# Patient Record
Sex: Male | Born: 1935 | Race: White | Hispanic: No | Smoking: Former smoker
Health system: Southern US, Community
[De-identification: ages and names within clinical notes are randomized; demographics above are authoritative.]

## PROBLEM LIST (undated history)

## (undated) DIAGNOSIS — R1310 Dysphagia, unspecified: Principal | ICD-10-CM

## (undated) DIAGNOSIS — F4320 Adjustment disorder, unspecified: Secondary | ICD-10-CM

## (undated) DIAGNOSIS — IMO0002 Reserved for concepts with insufficient information to code with codable children: Secondary | ICD-10-CM

## (undated) DIAGNOSIS — K635 Polyp of colon: Secondary | ICD-10-CM

## (undated) DIAGNOSIS — J310 Chronic rhinitis: Secondary | ICD-10-CM

## (undated) DIAGNOSIS — R296 Repeated falls: Secondary | ICD-10-CM

## (undated) DIAGNOSIS — J449 Chronic obstructive pulmonary disease, unspecified: Secondary | ICD-10-CM

## (undated) DIAGNOSIS — R600 Localized edema: Secondary | ICD-10-CM

## (undated) DIAGNOSIS — E7800 Pure hypercholesterolemia, unspecified: Secondary | ICD-10-CM

## (undated) DIAGNOSIS — R634 Abnormal weight loss: Secondary | ICD-10-CM

## (undated) DIAGNOSIS — F4321 Adjustment disorder with depressed mood: Secondary | ICD-10-CM

## (undated) DIAGNOSIS — I2510 Atherosclerotic heart disease of native coronary artery without angina pectoris: Secondary | ICD-10-CM

## (undated) DIAGNOSIS — R0600 Dyspnea, unspecified: Secondary | ICD-10-CM

## (undated) DIAGNOSIS — J90 Pleural effusion, not elsewhere classified: Secondary | ICD-10-CM

## (undated) DIAGNOSIS — D72829 Elevated white blood cell count, unspecified: Secondary | ICD-10-CM

## (undated) DIAGNOSIS — K279 Peptic ulcer, site unspecified, unspecified as acute or chronic, without hemorrhage or perforation: Secondary | ICD-10-CM

## (undated) DIAGNOSIS — F1010 Alcohol abuse, uncomplicated: Secondary | ICD-10-CM

## (undated) DIAGNOSIS — H3530 Unspecified macular degeneration: Secondary | ICD-10-CM

## (undated) DIAGNOSIS — K55059 Acute (reversible) ischemia of intestine, part and extent unspecified: Secondary | ICD-10-CM

## (undated) DIAGNOSIS — I10 Essential (primary) hypertension: Secondary | ICD-10-CM

## (undated) DIAGNOSIS — R29890 Loss of height: Secondary | ICD-10-CM

## (undated) HISTORY — DX: Acute (reversible) ischemia of intestine, part and extent unspecified: K55.059

## (undated) HISTORY — DX: Essential (primary) hypertension: I10

## (undated) HISTORY — DX: Adjustment disorder, unspecified: F43.20

## (undated) HISTORY — DX: Pleural effusion, not elsewhere classified: J90

## (undated) HISTORY — DX: Adjustment disorder with depressed mood: F43.21

## (undated) HISTORY — DX: Unspecified macular degeneration: H35.30

## (undated) HISTORY — DX: Repeated falls: R29.6

## (undated) HISTORY — DX: Pure hypercholesterolemia, unspecified: E78.00

## (undated) HISTORY — DX: Dyspnea, unspecified: R06.00

## (undated) HISTORY — DX: Peptic ulcer, site unspecified, unspecified as acute or chronic, without hemorrhage or perforation: K27.9

## (undated) HISTORY — DX: Reserved for concepts with insufficient information to code with codable children: IMO0002

## (undated) HISTORY — DX: Atherosclerotic heart disease of native coronary artery without angina pectoris: I25.10

## (undated) HISTORY — DX: Abnormal weight loss: R63.4

## (undated) HISTORY — DX: Elevated white blood cell count, unspecified: D72.829

## (undated) HISTORY — DX: Polyp of colon: K63.5

## (undated) HISTORY — DX: Localized edema: R60.0

## (undated) HISTORY — DX: Dysphagia, unspecified: R13.10

## (undated) HISTORY — DX: Chronic rhinitis: J31.0

## (undated) HISTORY — DX: Alcohol abuse, uncomplicated: F10.10

## (undated) HISTORY — DX: Loss of height: R29.890

---

## 1976-10-10 HISTORY — PX: EXPLORATORY LAPAROTOMY: SUR591

## 1980-10-10 HISTORY — PX: THORACOTOMY: SUR1349

## 1998-08-18 ENCOUNTER — Ambulatory Visit (HOSPITAL_COMMUNITY): Admission: RE | Admit: 1998-08-18 | Discharge: 1998-08-18 | Payer: Self-pay | Admitting: Gastroenterology

## 2001-10-10 HISTORY — PX: SPINE SURGERY: SHX786

## 2001-10-10 HISTORY — PX: BACK SURGERY: SHX140

## 2001-10-24 ENCOUNTER — Ambulatory Visit (HOSPITAL_COMMUNITY): Admission: RE | Admit: 2001-10-24 | Discharge: 2001-10-24 | Payer: Self-pay | Admitting: Gastroenterology

## 2002-06-12 ENCOUNTER — Encounter: Payer: Self-pay | Admitting: *Deleted

## 2002-06-12 ENCOUNTER — Ambulatory Visit (HOSPITAL_COMMUNITY): Admission: RE | Admit: 2002-06-12 | Discharge: 2002-06-12 | Payer: Self-pay | Admitting: *Deleted

## 2002-06-20 ENCOUNTER — Encounter: Payer: Self-pay | Admitting: *Deleted

## 2002-06-20 ENCOUNTER — Ambulatory Visit (HOSPITAL_COMMUNITY): Admission: RE | Admit: 2002-06-20 | Discharge: 2002-06-20 | Payer: Self-pay | Admitting: *Deleted

## 2002-10-18 ENCOUNTER — Encounter: Payer: Self-pay | Admitting: Orthopedic Surgery

## 2002-10-18 ENCOUNTER — Ambulatory Visit (HOSPITAL_COMMUNITY): Admission: RE | Admit: 2002-10-18 | Discharge: 2002-10-18 | Payer: Self-pay | Admitting: Orthopedic Surgery

## 2003-06-21 ENCOUNTER — Inpatient Hospital Stay (HOSPITAL_COMMUNITY): Admission: EM | Admit: 2003-06-21 | Discharge: 2003-06-24 | Payer: Self-pay | Admitting: *Deleted

## 2003-06-21 HISTORY — PX: CARDIAC CATHETERIZATION: SHX172

## 2003-06-22 ENCOUNTER — Encounter: Payer: Self-pay | Admitting: Cardiology

## 2003-07-11 HISTORY — PX: CORONARY STENT PLACEMENT: SHX1402

## 2003-07-30 ENCOUNTER — Ambulatory Visit (HOSPITAL_COMMUNITY): Admission: RE | Admit: 2003-07-30 | Discharge: 2003-07-31 | Payer: Self-pay | Admitting: Cardiology

## 2003-08-15 ENCOUNTER — Ambulatory Visit (HOSPITAL_COMMUNITY): Admission: RE | Admit: 2003-08-15 | Discharge: 2003-08-15 | Payer: Self-pay | Admitting: Cardiology

## 2004-05-10 HISTORY — PX: CORONARY ANGIOPLASTY WITH STENT PLACEMENT: SHX49

## 2004-05-12 ENCOUNTER — Ambulatory Visit (HOSPITAL_COMMUNITY): Admission: RE | Admit: 2004-05-12 | Discharge: 2004-05-13 | Payer: Self-pay | Admitting: Cardiology

## 2004-11-30 ENCOUNTER — Ambulatory Visit: Payer: Self-pay | Admitting: Cardiology

## 2004-12-30 ENCOUNTER — Ambulatory Visit: Payer: Self-pay | Admitting: Critical Care Medicine

## 2005-01-06 ENCOUNTER — Ambulatory Visit: Payer: Self-pay | Admitting: Internal Medicine

## 2005-08-31 ENCOUNTER — Ambulatory Visit: Payer: Self-pay | Admitting: Internal Medicine

## 2005-11-29 ENCOUNTER — Ambulatory Visit: Payer: Self-pay | Admitting: Cardiology

## 2006-04-24 ENCOUNTER — Ambulatory Visit: Payer: Self-pay | Admitting: Internal Medicine

## 2006-04-28 ENCOUNTER — Encounter (INDEPENDENT_AMBULATORY_CARE_PROVIDER_SITE_OTHER): Payer: Self-pay | Admitting: *Deleted

## 2006-04-28 ENCOUNTER — Ambulatory Visit: Payer: Self-pay | Admitting: Internal Medicine

## 2006-04-28 LAB — CONVERTED CEMR LAB: PSA: 3.23 ng/mL

## 2006-05-11 ENCOUNTER — Ambulatory Visit: Payer: Self-pay | Admitting: Cardiology

## 2006-05-12 ENCOUNTER — Ambulatory Visit: Payer: Self-pay | Admitting: Emergency Medicine

## 2006-06-06 ENCOUNTER — Ambulatory Visit: Payer: Self-pay | Admitting: Cardiology

## 2006-06-19 ENCOUNTER — Ambulatory Visit: Payer: Self-pay | Admitting: Internal Medicine

## 2006-09-21 ENCOUNTER — Ambulatory Visit: Payer: Self-pay | Admitting: Cardiology

## 2006-10-05 ENCOUNTER — Ambulatory Visit: Payer: Self-pay | Admitting: Internal Medicine

## 2006-11-10 HISTORY — PX: COLONOSCOPY W/ POLYPECTOMY: SHX1380

## 2006-11-29 LAB — HM COLONOSCOPY

## 2006-12-11 ENCOUNTER — Inpatient Hospital Stay (HOSPITAL_COMMUNITY): Admission: EM | Admit: 2006-12-11 | Discharge: 2006-12-12 | Payer: Self-pay | Admitting: Emergency Medicine

## 2007-03-13 ENCOUNTER — Encounter: Payer: Self-pay | Admitting: Internal Medicine

## 2007-03-20 ENCOUNTER — Ambulatory Visit: Payer: Self-pay | Admitting: Cardiology

## 2007-03-21 ENCOUNTER — Ambulatory Visit: Payer: Self-pay | Admitting: Cardiology

## 2007-03-21 LAB — CONVERTED CEMR LAB
ALT: 19 units/L (ref 0–40)
AST: 22 units/L (ref 0–37)
Albumin: 3.5 g/dL (ref 3.5–5.2)
Alkaline Phosphatase: 58 units/L (ref 39–117)
Bilirubin, Direct: 0.1 mg/dL (ref 0.0–0.3)
Cholesterol: 115 mg/dL (ref 0–200)
HDL: 30.4 mg/dL — ABNORMAL LOW (ref >39.0)
LDL Cholesterol: 68 mg/dL (ref 0–99)
Total Bilirubin: 0.7 mg/dL (ref 0.3–1.2)
Total CHOL/HDL Ratio: 3.8
Total Protein: 5.6 g/dL — ABNORMAL LOW (ref 6.0–8.3)
Triglycerides: 82 mg/dL (ref 0–149)
VLDL: 16 mg/dL (ref 0–40)

## 2007-03-30 ENCOUNTER — Encounter: Payer: Self-pay | Admitting: Internal Medicine

## 2007-09-10 ENCOUNTER — Ambulatory Visit: Payer: Self-pay | Admitting: Internal Medicine

## 2007-09-10 DIAGNOSIS — R091 Pleurisy: Secondary | ICD-10-CM | POA: Insufficient documentation

## 2007-09-12 ENCOUNTER — Encounter (INDEPENDENT_AMBULATORY_CARE_PROVIDER_SITE_OTHER): Payer: Self-pay | Admitting: *Deleted

## 2007-09-12 DIAGNOSIS — K279 Peptic ulcer, site unspecified, unspecified as acute or chronic, without hemorrhage or perforation: Secondary | ICD-10-CM | POA: Insufficient documentation

## 2007-09-12 DIAGNOSIS — I219 Acute myocardial infarction, unspecified: Secondary | ICD-10-CM | POA: Insufficient documentation

## 2007-09-12 DIAGNOSIS — F1010 Alcohol abuse, uncomplicated: Secondary | ICD-10-CM | POA: Insufficient documentation

## 2007-09-12 DIAGNOSIS — T148XXA Other injury of unspecified body region, initial encounter: Secondary | ICD-10-CM | POA: Insufficient documentation

## 2007-09-12 DIAGNOSIS — D126 Benign neoplasm of colon, unspecified: Secondary | ICD-10-CM

## 2007-11-22 ENCOUNTER — Ambulatory Visit: Payer: Self-pay | Admitting: Cardiology

## 2007-11-26 ENCOUNTER — Ambulatory Visit: Payer: Self-pay | Admitting: Internal Medicine

## 2007-11-26 LAB — CONVERTED CEMR LAB
Cholesterol: 126 mg/dL (ref 0–200)
HDL: 26.8 mg/dL — ABNORMAL LOW (ref >39.0)
LDL Cholesterol: 86 mg/dL (ref 0–99)
Total CHOL/HDL Ratio: 4.7
Triglycerides: 67 mg/dL (ref 0–149)
VLDL: 13 mg/dL (ref 0–40)

## 2007-11-29 ENCOUNTER — Ambulatory Visit: Payer: Self-pay | Admitting: Internal Medicine

## 2007-11-29 DIAGNOSIS — E786 Lipoprotein deficiency: Secondary | ICD-10-CM | POA: Insufficient documentation

## 2007-11-29 DIAGNOSIS — I2510 Atherosclerotic heart disease of native coronary artery without angina pectoris: Secondary | ICD-10-CM

## 2007-11-29 LAB — CONVERTED CEMR LAB
Cholesterol, target level: 200 mg/dL
HDL goal, serum: 40 mg/dL
LDL Goal: 100 mg/dL

## 2008-01-08 ENCOUNTER — Encounter (INDEPENDENT_AMBULATORY_CARE_PROVIDER_SITE_OTHER): Payer: Self-pay | Admitting: *Deleted

## 2008-01-08 ENCOUNTER — Ambulatory Visit: Payer: Self-pay | Admitting: Internal Medicine

## 2008-01-08 LAB — CONVERTED CEMR LAB
OCCULT 1: NEGATIVE
OCCULT 2: NEGATIVE
OCCULT 3: NEGATIVE

## 2008-07-16 ENCOUNTER — Encounter (INDEPENDENT_AMBULATORY_CARE_PROVIDER_SITE_OTHER): Payer: Self-pay | Admitting: *Deleted

## 2008-10-17 ENCOUNTER — Ambulatory Visit: Payer: Self-pay | Admitting: Cardiology

## 2008-10-17 LAB — CONVERTED CEMR LAB
ALT: 28 units/L (ref 0–53)
AST: 25 units/L (ref 0–37)
Albumin: 3.9 g/dL (ref 3.5–5.2)
Alkaline Phosphatase: 83 units/L (ref 39–117)
BUN: 15 mg/dL (ref 6–23)
Basophils Absolute: 0 10*3/uL (ref 0.0–0.1)
Basophils Relative: 0.1 % (ref 0.0–3.0)
Bilirubin, Direct: 0.1 mg/dL (ref 0.0–0.3)
CO2: 30 meq/L (ref 19–32)
Calcium: 8.9 mg/dL (ref 8.4–10.5)
Chloride: 102 meq/L (ref 96–112)
Cholesterol: 139 mg/dL (ref 0–200)
Creatinine, Ser: 1.1 mg/dL (ref 0.4–1.5)
Eosinophils Absolute: 0.3 10*3/uL (ref 0.0–0.7)
Eosinophils Relative: 2.6 % (ref 0.0–5.0)
GFR calc Af Amer: 83 mL/min
GFR calc non Af Amer: 68 mL/min
Glucose, Bld: 111 mg/dL — ABNORMAL HIGH (ref 70–99)
HCT: 37.6 % — ABNORMAL LOW (ref 39.0–52.0)
HDL: 37.9 mg/dL — ABNORMAL LOW (ref >39.0)
Hemoglobin: 12.9 g/dL — ABNORMAL LOW (ref 13.0–17.0)
LDL Cholesterol: 90 mg/dL (ref 0–99)
Lymphocytes Relative: 17.1 % (ref 12.0–46.0)
MCHC: 34.3 g/dL (ref 30.0–36.0)
MCV: 96.3 fL (ref 78.0–100.0)
Monocytes Absolute: 1.1 10*3/uL — ABNORMAL HIGH (ref 0.1–1.0)
Monocytes Relative: 9.7 % (ref 3.0–12.0)
Neutro Abs: 8.1 10*3/uL — ABNORMAL HIGH (ref 1.4–7.7)
Neutrophils Relative %: 70.5 % (ref 43.0–77.0)
Platelets: 223 10*3/uL (ref 150–400)
Potassium: 4.6 meq/L (ref 3.5–5.1)
RBC: 3.9 M/uL — ABNORMAL LOW (ref 4.22–5.81)
RDW: 20.1 % — ABNORMAL HIGH (ref 11.5–14.6)
Sodium: 136 meq/L (ref 135–145)
Total Bilirubin: 1 mg/dL (ref 0.3–1.2)
Total CHOL/HDL Ratio: 3.7
Total Protein: 6.1 g/dL (ref 6.0–8.3)
Triglycerides: 55 mg/dL (ref 0–149)
VLDL: 11 mg/dL (ref 0–40)
WBC: 11.4 10*3/uL — ABNORMAL HIGH (ref 4.5–10.5)

## 2008-10-27 ENCOUNTER — Ambulatory Visit: Payer: Self-pay

## 2008-10-27 ENCOUNTER — Encounter: Payer: Self-pay | Admitting: Cardiology

## 2008-11-27 ENCOUNTER — Ambulatory Visit: Payer: Self-pay | Admitting: Internal Medicine

## 2008-11-27 DIAGNOSIS — J309 Allergic rhinitis, unspecified: Secondary | ICD-10-CM | POA: Insufficient documentation

## 2008-12-02 ENCOUNTER — Ambulatory Visit: Payer: Self-pay | Admitting: Cardiology

## 2008-12-19 ENCOUNTER — Ambulatory Visit: Payer: Self-pay | Admitting: Internal Medicine

## 2008-12-19 DIAGNOSIS — S4980XA Other specified injuries of shoulder and upper arm, unspecified arm, initial encounter: Secondary | ICD-10-CM

## 2009-01-22 DIAGNOSIS — I10 Essential (primary) hypertension: Secondary | ICD-10-CM | POA: Insufficient documentation

## 2009-01-26 ENCOUNTER — Encounter: Payer: Self-pay | Admitting: Cardiology

## 2009-01-26 ENCOUNTER — Ambulatory Visit: Payer: Self-pay | Admitting: Cardiology

## 2009-05-21 ENCOUNTER — Telehealth: Payer: Self-pay | Admitting: Cardiology

## 2009-07-03 ENCOUNTER — Encounter: Payer: Self-pay | Admitting: Cardiology

## 2009-07-15 ENCOUNTER — Telehealth (INDEPENDENT_AMBULATORY_CARE_PROVIDER_SITE_OTHER): Payer: Self-pay | Admitting: *Deleted

## 2009-11-27 ENCOUNTER — Ambulatory Visit: Payer: Self-pay | Admitting: Internal Medicine

## 2010-01-14 ENCOUNTER — Telehealth (INDEPENDENT_AMBULATORY_CARE_PROVIDER_SITE_OTHER): Payer: Self-pay | Admitting: *Deleted

## 2010-03-04 ENCOUNTER — Ambulatory Visit: Payer: Self-pay | Admitting: Cardiology

## 2010-03-09 ENCOUNTER — Ambulatory Visit: Payer: Self-pay | Admitting: Family Medicine

## 2010-03-10 ENCOUNTER — Ambulatory Visit: Payer: Self-pay | Admitting: Vascular Surgery

## 2010-03-10 ENCOUNTER — Ambulatory Visit: Payer: Self-pay | Admitting: Cardiology

## 2010-03-10 ENCOUNTER — Encounter (INDEPENDENT_AMBULATORY_CARE_PROVIDER_SITE_OTHER): Payer: Self-pay | Admitting: Internal Medicine

## 2010-03-12 ENCOUNTER — Ambulatory Visit: Payer: Self-pay | Admitting: Physical Medicine & Rehabilitation

## 2010-05-04 ENCOUNTER — Telehealth: Payer: Self-pay | Admitting: Internal Medicine

## 2010-05-04 ENCOUNTER — Ambulatory Visit: Payer: Self-pay | Admitting: Internal Medicine

## 2010-05-04 DIAGNOSIS — N259 Disorder resulting from impaired renal tubular function, unspecified: Secondary | ICD-10-CM | POA: Insufficient documentation

## 2010-05-04 DIAGNOSIS — M79609 Pain in unspecified limb: Secondary | ICD-10-CM | POA: Insufficient documentation

## 2010-05-04 DIAGNOSIS — R609 Edema, unspecified: Secondary | ICD-10-CM

## 2010-05-05 ENCOUNTER — Inpatient Hospital Stay (HOSPITAL_COMMUNITY): Admission: EM | Admit: 2010-05-05 | Discharge: 2010-05-07 | Payer: Self-pay | Admitting: Emergency Medicine

## 2010-05-05 ENCOUNTER — Encounter: Admission: RE | Admit: 2010-05-05 | Discharge: 2010-05-05 | Payer: Self-pay | Admitting: Internal Medicine

## 2010-05-06 LAB — CONVERTED CEMR LAB
BUN: 23 mg/dL (ref 6–23)
Basophils Absolute: 0 10*3/uL (ref 0.0–0.1)
Basophils Relative: 0.8 % (ref 0.0–3.0)
Creatinine, Ser: 1.1 mg/dL (ref 0.4–1.5)
Eosinophils Absolute: 0.2 10*3/uL (ref 0.0–0.7)
Eosinophils Relative: 4.1 % (ref 0.0–5.0)
HCT: 33.7 % — ABNORMAL LOW (ref 39.0–52.0)
Hemoglobin: 11.5 g/dL — ABNORMAL LOW (ref 13.0–17.0)
Lymphocytes Relative: 18.9 % (ref 12.0–46.0)
Lymphs Abs: 1.1 10*3/uL (ref 0.7–4.0)
MCHC: 34.1 g/dL (ref 30.0–36.0)
MCV: 98.2 fL (ref 78.0–100.0)
Monocytes Absolute: 0.4 10*3/uL (ref 0.1–1.0)
Monocytes Relative: 6.1 % (ref 3.0–12.0)
Neutro Abs: 4.2 10*3/uL (ref 1.4–7.7)
Neutrophils Relative %: 70.1 % (ref 43.0–77.0)
Platelets: 261 10*3/uL (ref 150.0–400.0)
Potassium: 4 meq/L (ref 3.5–5.1)
RBC: 3.43 M/uL — ABNORMAL LOW (ref 4.22–5.81)
RDW: 21.3 % — ABNORMAL HIGH (ref 11.5–14.6)
WBC: 6 10*3/uL (ref 4.5–10.5)

## 2010-05-11 ENCOUNTER — Telehealth (INDEPENDENT_AMBULATORY_CARE_PROVIDER_SITE_OTHER): Payer: Self-pay | Admitting: *Deleted

## 2010-05-11 ENCOUNTER — Encounter: Payer: Self-pay | Admitting: Internal Medicine

## 2010-05-12 ENCOUNTER — Ambulatory Visit: Payer: Self-pay | Admitting: Internal Medicine

## 2010-05-12 DIAGNOSIS — Z86718 Personal history of other venous thrombosis and embolism: Secondary | ICD-10-CM

## 2010-05-24 ENCOUNTER — Telehealth (INDEPENDENT_AMBULATORY_CARE_PROVIDER_SITE_OTHER): Payer: Self-pay | Admitting: *Deleted

## 2010-05-25 ENCOUNTER — Telehealth: Payer: Self-pay | Admitting: Internal Medicine

## 2010-05-31 ENCOUNTER — Ambulatory Visit: Payer: Self-pay | Admitting: Internal Medicine

## 2010-05-31 LAB — CONVERTED CEMR LAB: POC INR: 1.6

## 2010-06-04 ENCOUNTER — Ambulatory Visit: Payer: Self-pay | Admitting: Internal Medicine

## 2010-06-04 ENCOUNTER — Encounter: Payer: Self-pay | Admitting: Internal Medicine

## 2010-06-18 ENCOUNTER — Ambulatory Visit: Payer: Self-pay | Admitting: Internal Medicine

## 2010-06-18 LAB — CONVERTED CEMR LAB: POC INR: 1.3

## 2010-07-02 ENCOUNTER — Ambulatory Visit: Payer: Self-pay | Admitting: Internal Medicine

## 2010-07-02 LAB — CONVERTED CEMR LAB: POC INR: 1.4

## 2010-07-09 ENCOUNTER — Ambulatory Visit: Payer: Self-pay | Admitting: Internal Medicine

## 2010-07-09 LAB — CONVERTED CEMR LAB: INR: 1.7

## 2010-07-19 ENCOUNTER — Ambulatory Visit: Payer: Self-pay | Admitting: Internal Medicine

## 2010-07-19 LAB — CONVERTED CEMR LAB: INR: 2.7

## 2010-08-09 ENCOUNTER — Ambulatory Visit: Payer: Self-pay | Admitting: Internal Medicine

## 2010-08-09 LAB — CONVERTED CEMR LAB: INR: 2.6

## 2010-08-30 ENCOUNTER — Ambulatory Visit: Payer: Self-pay | Admitting: Internal Medicine

## 2010-08-30 DIAGNOSIS — R195 Other fecal abnormalities: Secondary | ICD-10-CM

## 2010-08-30 LAB — CONVERTED CEMR LAB: INR: 2.2

## 2010-08-31 LAB — CONVERTED CEMR LAB
Basophils Absolute: 0.1 10*3/uL (ref 0.0–0.1)
Basophils Relative: 1 % (ref 0–1)
Eosinophils Absolute: 0.4 10*3/uL (ref 0.0–0.7)
Eosinophils Relative: 6 % — ABNORMAL HIGH (ref 0–5)
HCT: 37.4 % — ABNORMAL LOW (ref 39.0–52.0)
Hemoglobin: 11.8 g/dL — ABNORMAL LOW (ref 13.0–17.0)
Lymphocytes Relative: 21 % (ref 12–46)
Lymphs Abs: 1.6 10*3/uL (ref 0.7–4.0)
MCHC: 31.6 g/dL (ref 30.0–36.0)
MCV: 93 fL (ref 78.0–100.0)
Monocytes Absolute: 0.5 10*3/uL (ref 0.1–1.0)
Monocytes Relative: 7 % (ref 3–12)
Neutro Abs: 4.8 10*3/uL (ref 1.7–7.7)
Neutrophils Relative %: 65 % (ref 43–77)
Platelets: 233 10*3/uL (ref 150–400)
RBC: 4.02 M/uL — ABNORMAL LOW (ref 4.22–5.81)
RDW: 21.3 % — ABNORMAL HIGH (ref 11.5–15.5)
WBC: 7.4 10*3/uL (ref 4.0–10.5)

## 2010-09-09 ENCOUNTER — Ambulatory Visit: Payer: Self-pay | Admitting: Internal Medicine

## 2010-09-09 LAB — CONVERTED CEMR LAB
OCCULT 1: NEGATIVE
OCCULT 2: NEGATIVE
OCCULT 3: NEGATIVE

## 2010-09-13 ENCOUNTER — Ambulatory Visit: Payer: Self-pay | Admitting: Internal Medicine

## 2010-09-13 DIAGNOSIS — D649 Anemia, unspecified: Secondary | ICD-10-CM

## 2010-09-13 LAB — CONVERTED CEMR LAB: INR: 2.2

## 2010-09-14 LAB — CONVERTED CEMR LAB
Basophils Absolute: 0.1 10*3/uL (ref 0.0–0.1)
Basophils Relative: 1.1 % (ref 0.0–3.0)
Eosinophils Absolute: 0.3 10*3/uL (ref 0.0–0.7)
Eosinophils Relative: 3.5 % (ref 0.0–5.0)
Folate: >20 ng/mL
HCT: 35.6 % — ABNORMAL LOW (ref 39.0–52.0)
Hemoglobin: 11.9 g/dL — ABNORMAL LOW (ref 13.0–17.0)
Iron: 98 ug/dL (ref 42–165)
Lymphocytes Relative: 22 % (ref 12.0–46.0)
Lymphs Abs: 1.8 10*3/uL (ref 0.7–4.0)
MCHC: 33.6 g/dL (ref 30.0–36.0)
MCV: 92.6 fL (ref 78.0–100.0)
Monocytes Absolute: 0.7 10*3/uL (ref 0.1–1.0)
Monocytes Relative: 8.6 % (ref 3.0–12.0)
Neutro Abs: 5.2 10*3/uL (ref 1.4–7.7)
Neutrophils Relative %: 64.8 % (ref 43.0–77.0)
Platelets: 256 10*3/uL (ref 150.0–400.0)
RBC: 3.84 M/uL — ABNORMAL LOW (ref 4.22–5.81)
RDW: 23.2 % — ABNORMAL HIGH (ref 11.5–14.6)
Saturation Ratios: 25.9 % (ref 20.0–50.0)
Transferrin: 270.2 mg/dL (ref 212.0–360.0)
Vitamin B-12: 652 pg/mL (ref 211–911)
WBC: 8 10*3/uL (ref 4.5–10.5)

## 2010-09-16 ENCOUNTER — Inpatient Hospital Stay (HOSPITAL_COMMUNITY): Admission: EM | Admit: 2010-09-16 | Discharge: 2010-03-16 | Payer: Self-pay | Admitting: Emergency Medicine

## 2010-10-12 ENCOUNTER — Ambulatory Visit
Admission: RE | Admit: 2010-10-12 | Discharge: 2010-10-12 | Payer: Self-pay | Source: Home / Self Care | Attending: Internal Medicine | Admitting: Internal Medicine

## 2010-11-10 ENCOUNTER — Telehealth (INDEPENDENT_AMBULATORY_CARE_PROVIDER_SITE_OTHER): Payer: Self-pay | Admitting: *Deleted

## 2010-11-11 NOTE — Assessment & Plan Note (Signed)
Summary: PT Check/scm  Nurse Visit   Vital Signs:  Patient profile:   77 year old male Weight:      179 pounds BMI:     27.32 Pulse rate:   60 / minute BP sitting:   124 / 60  (left arm) Cuff size:   large  Vitals Entered By: Shonna Chock CMA (July 19, 2010 11:06 AM) CC: PT Check   Allergies: 1)  ! * Antihistamines 2)  ! * Deconsal 3)  ! * Cefzil 4)  ! * Sleeping Pills 5)  ! Zocor Laboratory Results   Blood Tests      INR: 2.7   (Normal Range: 0.88-1.12   Therap INR: 2.0-3.5)    Orders Added: 1)  Est. Patient Level I [81191] 2)  Protime [[id number]]   ANTICOAGULATION RECORD PREVIOUS REGIMEN & LAB RESULTS   Previous INR:  1.7 on  07/09/2010 Previous Coumadin Dose(mg):  10mg  x 1 day than 7.5mg  M/W/F, 5mg  all other days on  07/09/2010 Previous Regimen:  7.5mg  daily except 5mg  on Wed on  07/09/2010  NEW REGIMEN & LAB RESULTS Current INR: 2.7 Current Coumadin Dose(mg): 5mg  on Wed, 7.5mg  all other days Regimen: 5mg  TODAY, then resume regular schedule 5mg  on Wed, 7.5mg  all other days  Provider: Lillie Portner      Repeat testing in: 3 weeks Other Comments: Was 1.7 on 07/09/2010  MEDICATIONS ISOSORBIDE DINITRATE 10 MG  TABS (ISOSORBIDE DINITRATE) 1 by mouth three times a day METOPROLOL SUCCINATE 50 MG XR24H-TAB (METOPROLOL SUCCINATE) 1 by mouth once daily BAYER LOW STRENGTH 81 MG TBEC (ASPIRIN) 1 by mouth once daily ANTIOXIDANT  TABS (MULTIPLE VITAMINS-MINERALS) Take 1 tablet by mouth once a day COQ10 100 MG CAPS (COENZYME Q10) 1 by mouth once daily NITROGLYCERIN 0.4 MG SUBL (NITROGLYCERIN) One tablet under tongue every 5 minutes as needed for chest pain---may repeat times three * VITAMIN D 5000 UNIT TABS (CHOLECALCIFEROL) 1 by mouth once daily MUCUS RELIEF 400 MG TABS (GUAIFENESIN) as needed ZINC 50 MG TABS (ZINC) one a day SELENIUM 200 MCG TABS (SELENIUM) 1 by mouth once daily FISH OIL 500 MG CAPS (OMEGA-3 FATTY ACIDS) Take 1 capsule by mouth once a  day NIASPAN 500 MG CR-TABS (NIACIN (ANTIHYPERLIPIDEMIC)) 1 by mouth at bedtime MIRALAX  POWD (POLYETHYLENE GLYCOL 3350) 17gm every morning ENSURE PLUS  LIQD (NUTRITIONAL SUPPLEMENTS) three times a day POLYETHYLENE GLYCOL 3350  POWD (POLYETHYLENE GLYCOL 3350) 17 grams by mouth as needed SENNA 187 MG TABS (SENNA) 2 by mouth once daily as needed SIMVASTATIN 20 MG TABS (SIMVASTATIN) 1 by mouth at bedtime COUMADIN 5 MG TABS (WARFARIN SODIUM) as directed based on PT/INR check   Anticoagulation Visit Questionnaire      Coumadin dose missed/changed:  No      Abnormal Bleeding Symptoms:  No   Any diet changes including alcohol intake, vegetables or greens since the last visit:  No Any illnesses or hospitalizations since the last visit:  No Any signs of clotting since the last visit (including chest discomfort, dizziness, shortness of breath, arm tingling, slurred speech, swelling or redness in leg):  No

## 2010-11-11 NOTE — Letter (Signed)
Summary: MMSE Form  MMSE Form   Imported By: Lanelle Bal 05/11/2010 11:23:37  _____________________________________________________________________  External Attachment:    Type:   Image     Comment:   External Document

## 2010-11-11 NOTE — Assessment & Plan Note (Signed)
Summary: pt check///sph  Nurse Visit   Vital Signs:  Patient profile:   77 year old male Height:      68 inches (172.72 cm) Weight:      181.25 pounds (82.39 kg) BMI:     27.66 Temp:     97.8 degrees F (36.56 degrees C) oral BP sitting:   124 / 50  (left arm) Cuff size:   large  Vitals Entered By: Lucious Groves CMA (October 12, 2010 10:45 AM) CC: PT check./kb   Allergies: 1)  ! * Antihistamines 2)  ! * Deconsal 3)  ! * Cefzil 4)  ! * Sleeping Pills 5)  ! Zocor Laboratory Results   Blood Tests      INR: 2.7   (Normal Range: 0.88-1.12   Therap INR: 2.0-3.5)    Orders Added: 1)  Est. Patient Level I [44010] 2)  Protime [[id number]]   ANTICOAGULATION RECORD PREVIOUS REGIMEN & LAB RESULTS Anticoagulation Diagnosis:  Deep venous thrombosis on  08/30/2010  Previous INR:  2.2 on  09/13/2010 Previous Coumadin Dose(mg):  7.5mg  daily except 5mg  on Wed  on  09/13/2010 Previous Regimen:  same on  08/09/2010  NEW REGIMEN & LAB RESULTS Anticoag. Dx: Deep venous thrombosis Current INR: 2.7 Current Coumadin Dose(mg): 7.5mg  qd and 5mg  on Wed only Regimen: same  (no change)  Provider: Alwyn Ren Repeat testing in: 4 weeks Other Comments: Patient notes that he has appt for dental cleaning tomorrow and would like to know if he should hold the coumadin. Please advise. Lucious Groves CMA  October 12, 2010 10:47 AM   Per MD-- take 5mg  on Tues and Sat and 7.5mg  all other days. Re-check in 4 weeks. Also, stopping coumadin for dental cleaning is not advisable due to patient vascular problems. Left message on voicemail to call back to office. Lucious Groves CMA  October 12, 2010 3:58 PM   Patient notified. Lucious Groves CMA  October 12, 2010 5:11 PM

## 2010-11-11 NOTE — Assessment & Plan Note (Signed)
Summary: ACUTE FOR BOTH LEGS   Vital Signs:  Patient profile:   77 year old male Temp:     99.4 degrees F oral Pulse rate:   86 / minute Pulse rhythm:   regular BP sitting:   122 / 86  (left arm) Cuff size:   large  Vitals Entered By: Army Fossa CMA (Mar 09, 2010 4:17 PM) CC: Pt here states he fell last night, both legs in extreme pain now, barely can walk   History of Present Illness:  Injury      This is an 77 year old man who presents with An injury.  The symptoms began 12-24 hrs ago.  Pt here with neighbor c/o L leg and ankle pain after falling last night over boxes.  He spent the night on the floor and presents to the office this afternoon with swelling and reddness in L low ext and thigh.  The patient reports injury to the left leg.  The patient also reports swelling, redness, tenderness, and increased warmth deformity.  Risk factors for significant bleeding include aspirin use.  Pt denies Loc or head injury.      Preventive Screening-Counseling & Management  Alcohol-Tobacco     Smoking Status: never     Year Quit: 1950  Current Medications (verified): 1)  Isosorbide Dinitrate 10 Mg  Tabs (Isosorbide Dinitrate) .Marland Kitchen.. 1 By Mouth Three Times A Day 2)  Toprol Xl 25 Mg Xr24h-Tab (Metoprolol Succinate) .... 2 By Mouth Once Daily 3)  Niaspan 500 Mg  Tbcr (Niacin (Antihyperlipidemic)) .Marland Kitchen.. 1 By Mouth Once Daily 4)  Bayer Low Strength 81 Mg Tbec (Aspirin) .Marland Kitchen.. 1 By Mouth Once Daily 5)  Antioxidant  Tabs (Multiple Vitamins-Minerals) .... Take 1 Tablet By Mouth Once A Day 6)  Coq10 100 Mg Caps (Coenzyme Q10) .Marland Kitchen.. 1 By Mouth Once Daily 7)  Nitroglycerin 0.4 Mg Subl (Nitroglycerin) .... One Tablet Under Tongue Every 5 Minutes As Needed For Chest Pain---May Repeat Times Three 8)  Vitamin D 3  5,000 Iu .... Take 1 Capsule By Mouth Once A Day 9)  Mucus Relief 400 Mg Tabs (Guaifenesin) .... As Needed 10)  Zinc 50 Mg Tabs (Zinc) .... One A Day 11)  Selenium 100 Mcg Tabs (Selenium) ....  Take 1 Tablet By Mouth Once A Day 12)  Fish Oil 500 Mg Caps (Omega-3 Fatty Acids) .... Take 1 Capsule By Mouth Once A Day  Allergies: 1)  ! * Antihistamines 2)  ! * Deconsal 3)  ! * Cefzil 4)  ! * Sleeping Pills 5)  ! Zocor  Past History:  Past Medical History: Last updated: 01/22/2009 Loss of 3" in height Compression fx. HYPERTENSION, UNSPECIFIED (ICD-401.9) HYPERCHOLESTEROLEMIA  IIA (ICD-272.0) ARM INJURY (ICD-959.2) RHINITIS (ICD-477.9) DYSPNEA/SHORTNESS OF BREATH (ICD-786.09) WEIGHT LOSS (ICD-783.21) LOW HDL (ICD-272.5) C A D (ICD-414.00) Hx of PEPTIC ULCER DISEASE (ICD-533.90) Hx of ALCOHOL ABUSE (ICD-305.00) Hx of AMI (ICD-410.90) Hx of COMPRESSION FRACTURE (ICD-829.0) COLONIC POLYPS (ICD-211.3) PLEURISY WITHOUT MENTION EFFUS/CURRENT TB (ICD-511.0)    Past Surgical History: Last updated: 11/29/2007 Exploratory sx. for kidney problems (1978) Thoracotomy-hemothorax (0865) Surgery for cervical spondylosis (2003) C-spine (2003) Cath. (06-21-03) Stent placement (07/2003) X 3 Cath. (05/2004) -stent Colonscopy-polyp (11/2006)  Family History: Last updated: 06-Oct-2007 Father: DM (died 58) Mother: Colon CA  (died 42) Siblings:   Social History: Last updated: 01/22/2009 Retired  Tobacco Use - No.  Alcohol Use - no Drug Use - no  Risk Factors: Smoking Status: never (03/09/2010)  Family History: Reviewed history  from 09/12/2007 and no changes required. Father: DM (died 32) Mother: Colon CA  (died 39) Siblings:   Social History: Reviewed history from 01/22/2009 and no changes required. Retired  Tobacco Use - No.  Alcohol Use - no Drug Use - no  Review of Systems      See HPI  Physical Exam  General:  Well-developed,well-nourished,in no acute distress; alert,appropriate and cooperative throughout examination---  Pt in wheelchair secondary to pain in Left ankle and leg. Neck:  No deformities, masses, or tenderness noted. Lungs:  Normal respiratory  effort, chest expands symmetrically. Lungs are clear to auscultation, no crackles or wheezes. Msk:  + swelling and reddness L low ext--ankle to groin warm to touch + calf pain + pain in L ankle Neurologic:  Pt answered all questions appropriately---pt is a little confused when talking about medications Skin:  see above Psych:  memory intact for recent and remote, normally interactive, and good eye contact.     Impression & Recommendations:  Problem # 1:  CELLULITIS AND ABSCESS OF LEG EXCEPT FOOT (ICD-682.6) d/w Dr Wyatt Mage asked that the pt go through ER so workup can be done first [facility] : [facility] Er notified  Problem # 2:  CALF PAIN, LEFT (ICD-729.5)  Complete Medication List: 1)  Isosorbide Dinitrate 10 Mg Tabs (Isosorbide dinitrate) .Marland Kitchen.. 1 by mouth three times a day 2)  Toprol Xl 25 Mg Xr24h-tab (Metoprolol succinate) .... 2 by mouth once daily 3)  Niaspan 500 Mg Tbcr (Niacin (antihyperlipidemic)) .Marland Kitchen.. 1 by mouth once daily 4)  Bayer Low Strength 81 Mg Tbec (Aspirin) .Marland Kitchen.. 1 by mouth once daily 5)  Antioxidant Tabs (Multiple vitamins-minerals) .... Take 1 tablet by mouth once a day 6)  Coq10 100 Mg Caps (Coenzyme q10) .Marland Kitchen.. 1 by mouth once daily 7)  Nitroglycerin 0.4 Mg Subl (Nitroglycerin) .... One tablet under tongue every 5 minutes as needed for chest pain---may repeat times three 8)  Vitamin D 3 5,000 Iu  .... Take 1 capsule by mouth once a day 9)  Mucus Relief 400 Mg Tabs (Guaifenesin) .... As needed 10)  Zinc 50 Mg Tabs (Zinc) .... One a day 11)  Selenium 100 Mcg Tabs (Selenium) .... Take 1 tablet by mouth once a day 12)  Fish Oil 500 Mg Caps (Omega-3 fatty acids) .... Take 1 capsule by mouth once a day

## 2010-11-11 NOTE — Assessment & Plan Note (Signed)
Summary: PT CHECK///SPH  Nurse Visit   Vital Signs:  Patient profile:   77 year old male Height:      68 inches Weight:      179 pounds Temp:     97.6 degrees F oral Pulse rate:   76 / minute BP sitting:   110 / 58  (left arm)  Vitals Entered By: Jeremy Johann CMA (August 09, 2010 11:07 AM)  Allergies: 1)  ! * Antihistamines 2)  ! * Deconsal 3)  ! * Cefzil 4)  ! * Sleeping Pills 5)  ! Zocor Laboratory Results   Blood Tests    Date/Time Reported: August 09, 2010 11:08 AM   INR: 2.6   (Normal Range: 0.88-1.12   Therap INR: 2.0-3.5)    Orders Added: 1)  Est. Patient Level I [16109] 2)  Protime [[id number]]  Patient Instructions: 1)  Please schedule a follow-up appointment in 3 weeks for PT/INR check. 2)  Continue current dose of 1 1/2 tabs(7.5mg ) daily except wednesday 1 tab (5mg )..     ANTICOAGULATION RECORD PREVIOUS REGIMEN & LAB RESULTS   Previous INR:  2.7 on  07/19/2010 Previous Coumadin Dose(mg):  5mg  on Wed, 7.5mg  all other days on  07/19/2010 Previous Regimen:  5mg  TODAY, then resume regular schedule 5mg  on Wed, 7.5mg  all other days on  07/19/2010  NEW REGIMEN & LAB RESULTS Current INR: 2.6 Current Coumadin Dose(mg): 1 1/2 tab(7.5mg ) except wed 1 tab(5mg ) Regimen: same  Provider: Alwyn Ren Repeat testing in: 3 weeks Other Comments: per chrae keep the same recheck 3 weeks.......Marland KitchenFelecia Deloach CMA  August 09, 2010 11:11 AM    Anticoagulation Visit Questionnaire Coumadin dose missed/changed:  No Abnormal Bleeding Symptoms:  No  Any diet changes including alcohol intake, vegetables or greens since the last visit:  No Any illnesses or hospitalizations since the last visit:  No Any signs of clotting since the last visit (including chest discomfort, dizziness, shortness of breath, arm tingling, slurred speech, swelling or redness in leg):  No  MEDICATIONS ISOSORBIDE DINITRATE 10 MG  TABS (ISOSORBIDE DINITRATE) 1 by mouth three times a  day METOPROLOL SUCCINATE 50 MG XR24H-TAB (METOPROLOL SUCCINATE) 1 by mouth once daily BAYER LOW STRENGTH 81 MG TBEC (ASPIRIN) 1 by mouth once daily ANTIOXIDANT  TABS (MULTIPLE VITAMINS-MINERALS) Take 1 tablet by mouth once a day COQ10 100 MG CAPS (COENZYME Q10) 1 by mouth once daily NITROGLYCERIN 0.4 MG SUBL (NITROGLYCERIN) One tablet under tongue every 5 minutes as needed for chest pain---may repeat times three * VITAMIN D 5000 UNIT TABS (CHOLECALCIFEROL) 1 by mouth once daily MUCUS RELIEF 400 MG TABS (GUAIFENESIN) as needed ZINC 50 MG TABS (ZINC) one a day SELENIUM 200 MCG TABS (SELENIUM) 1 by mouth once daily FISH OIL 500 MG CAPS (OMEGA-3 FATTY ACIDS) Take 1 capsule by mouth once a day NIASPAN 500 MG CR-TABS (NIACIN (ANTIHYPERLIPIDEMIC)) 1 by mouth at bedtime MIRALAX  POWD (POLYETHYLENE GLYCOL 3350) 17gm every morning ENSURE PLUS  LIQD (NUTRITIONAL SUPPLEMENTS) three times a day POLYETHYLENE GLYCOL 3350  POWD (POLYETHYLENE GLYCOL 3350) 17 grams by mouth as needed SENNA 187 MG TABS (SENNA) 2 by mouth once daily as needed SIMVASTATIN 20 MG TABS (SIMVASTATIN) 1 by mouth at bedtime COUMADIN 5 MG TABS (WARFARIN SODIUM) as directed based on PT/INR check

## 2010-11-11 NOTE — Assessment & Plan Note (Signed)
Summary: congestion/cough/was running a fever//lch   Vital Signs:  Patient profile:   77 year old male Weight:      173.4 pounds BMI:     26.46 Temp:     98.2 degrees F oral Pulse rate:   80 / minute Resp:     16 per minute BP sitting:   124 / 60  (left arm) Cuff size:   large  Vitals Entered By: Shonna Chock (November 27, 2009 10:49 AM) CC: Cough,congestion & fever x 3 days Comments REVIEWED MED LIST, PATIENT AGREED DOSE AND INSTRUCTION CORRECT    Primary Care Provider:  Marga Melnick MD  CC:  Cough and congestion & fever x 3 days.  History of Present Illness: Onset as head congestion 3 days ago with sneezing & cough with white sputum. Rx: Mucus Relief  Allergies: 1)  ! * Antihistamines 2)  ! * Deconsal 3)  ! * Cefzil 4)  ! * Sleeping Pills 5)  ! Zocor  Review of Systems General:  Denies chills and sweats; ? fever. ENT:  No frontal headache , facial pain , or purulence. Resp:  Complains of shortness of breath, sputum productive, and wheezing. Allergy:  Complains of itching eyes and sneezing.  Physical Exam  General:  in no acute distress; alert,appropriate and cooperative throughout examination Ears:  External ear exam shows no significant lesions or deformities.  Otoscopic examination reveals clear canals, tympanic membranes are intact bilaterally without bulging, retraction, inflammation or discharge. Hearing is grossly normal bilaterally. TMs dull Nose:  External nasal examination shows no deformity or inflammation. Nasal mucosa are pink and moist without lesions or exudates. Mouth:  Oral mucosa and oropharynx without lesions or exudates.  Teeth in good repair. Lungs:  Normal respiratory effort, chest expands symmetrically. Lungs are clear to auscultation, no crackles or wheezes. Cervical Nodes:  No lymphadenopathy noted Axillary Nodes:  No palpable lymphadenopathy   Impression & Recommendations:  Problem # 1:  URI (ICD-465.9)  His updated medication list  for this problem includes:    Bayer Low Strength 81 Mg Tbec (Aspirin) .Marland Kitchen... 1 by mouth once daily    Mucus Relief 400 Mg Tabs (Guaifenesin) .Marland Kitchen... As needed    Promethazine Vc/codeine 6.25-5-10 Mg/78ml Syrp (Phenyleph-promethazine-cod) .Marland Kitchen... 1 tsp q 6 hrs as needed  Problem # 2:  BRONCHITIS-ACUTE (ICD-466.0)  His updated medication list for this problem includes:    Mucus Relief 400 Mg Tabs (Guaifenesin) .Marland Kitchen... As needed    Doxycycline Hyclate 100 Mg Caps (Doxycycline hyclate) .Marland Kitchen... 1 two times a day x 2 days then 1 once daily    Promethazine Vc/codeine 6.25-5-10 Mg/91ml Syrp (Phenyleph-promethazine-cod) .Marland Kitchen... 1 tsp q 6 hrs as needed  Complete Medication List: 1)  Isosorbide Dinitrate 10 Mg Tabs (Isosorbide dinitrate) .Marland Kitchen.. 1 by mouth three times a day 2)  Toprol Xl 25 Mg Xr24h-tab (Metoprolol succinate) .... 2 by mouth once daily 3)  Niaspan 500 Mg Tbcr (Niacin (antihyperlipidemic)) .Marland Kitchen.. 1 by mouth once daily 4)  Bayer Low Strength 81 Mg Tbec (Aspirin) .Marland Kitchen.. 1 by mouth once daily 5)  Antioxidant Tabs (Multiple vitamins-minerals) .... Take 1 tablet by mouth once a day 6)  Coq10 100 Mg Caps (Coenzyme q10) .Marland Kitchen.. 1 by mouth once daily 7)  Nitroglycerin 0.4 Mg Subl (Nitroglycerin) .... One tablet under tongue every 5 minutes as needed for chest pain---may repeat times three 8)  Vitamin D 3 5,000 Iu  .... Take 1 capsule by mouth once a day 9)  Mucus Relief 400 Mg  Tabs (Guaifenesin) .... As needed 10)  Doxycycline Hyclate 100 Mg Caps (Doxycycline hyclate) .Marland Kitchen.. 1 two times a day x 2 days then 1 once daily 11)  Promethazine Vc/codeine 6.25-5-10 Mg/57ml Syrp (Phenyleph-promethazine-cod) .Marland Kitchen.. 1 tsp q 6 hrs as needed  Patient Instructions: 1)  Neti pot once daily until sinuses are clear. 2)  Drink as much fluid as you can tolerate for the next few days. Prescriptions: PROMETHAZINE VC/CODEINE 6.25-5-10 MG/5ML SYRP (PHENYLEPH-PROMETHAZINE-COD) 1 tsp q 6 hrs as needed  #120cc x 0   Entered and Authorized by:    Marga Melnick MD   Signed by:   Marga Melnick MD on 11/27/2009   Method used:   Printed then faxed to ...       [facility] [phone] (retail)       [address]       [place], [place]  [address]       Ph: [phone]       Fax:    RxID:   [id number] DOXYCYCLINE HYCLATE 100 MG CAPS (DOXYCYCLINE HYCLATE) 1 two times a day x 2 days then 1 once daily  #15 x 0   Entered and Authorized by:   Marga Melnick MD   Signed by:   Marga Melnick MD on 11/27/2009   Method used:   Printed then faxed to ...       [facility] [phone] (retail)       [address]       [place], [place]  [address]       Ph: [phone]       Fax:    RxID:   [id number]

## 2010-11-11 NOTE — Progress Notes (Signed)
Summary: Refill Request  Phone Note Refill Request Message from:  Patient on May 25, 2010 3:55 PM  Refills Requested: Medication #1:  COUMADIN 5 MG TABS 1 on Mon   Dosage confirmed as above?Dosage Confirmed   Supply Requested: 1 month [facility] on [address]   Initial call taken by: Harold Barban,  May 25, 2010 3:55 PM    Prescriptions: COUMADIN 5 MG TABS (WARFARIN SODIUM) 1 on Mon, Wed, Fri and Sun. 1/2 tablet Ulanda Edison & Sat -05/24/2010  #30 x 0   Entered by:   Brenton Grills MA   Authorized by:   Marga Melnick MD   Signed by:   Brenton Grills MA on 05/25/2010   Method used:   Electronically to        [id number] (retail)       [address] [place], [place]  [address]       Ph: [phone]       Fax: [phone]   RxID:   [id number]

## 2010-11-11 NOTE — Progress Notes (Signed)
Summary: PT/INR Results  Phone Note From Other Clinic   Caller: [facility] Summary of Call: PT/INR was checked on Sat 49.7/4.1, patient was informed to take 5mg  on Sat, 5mg  on Sun. PT/INR was checked again today and was 40.6/3.4, patient would like for Dr.Hopper to futher advise ( [facility] per Insurance, futher PT checks to be done here or coumadin Clinic)  Dr.Hopper please advise on dose to be taken and if futher PT checks to be one here or [facility]./[facility] CMA  May 24, 2010 12:22 PM   Follow-up for Phone Call        Left message on machine for patient to return call when avaliable, Reason for call:   Per Dr.Hopper have patient take 5mg  M/W/F/Sun, 2.5 T/TH/Sat 2.5 mg and recheck HERE in 1 week.  **Futher PT Checks to be done at this office if patient would like Dr.Hopper to monitor** Follow-up by: Shonna Chock CMA,  May 24, 2010 1:48 PM  Additional Follow-up for Phone Call Additional follow up Details #1::        SW patient,  Chrae  pls call [phone] or [phone] Diane Tomerlin  May 24, 2010 3:24 PM  Spoke w/patient. He is aware of instructions above. EMR updated.  PLEASE CONTACT PATIENT FOR 1 WEEK COUMADIN CHECK Additional Follow-up by: Lamar Sprinkles, CMA,  May 24, 2010 4:22 PM    Additional Follow-up for Phone Call Additional follow up Details #2::    lmtcb about appt.Marland KitchenMarland KitchenMarland KitchenHarold Barban  May 25, 2010 10:14 AM  Patient is aware and appt is made on monday.Harold Barban  May 25, 2010 3:55 PM  New/Updated Medications: COUMADIN 5 MG TABS (WARFARIN SODIUM) 1 on Mon, Wed, Fri and Sun. 1/2 tablet Ulanda Edison & [facility] -05/24/2010

## 2010-11-11 NOTE — Assessment & Plan Note (Signed)
Summary: F1Y   Visit Type:  1 year follow up Primary Provider:  Marga Melnick MD  CC:  No complains.  History of Present Illness: Patient is doing well at present.  Denies chest pain.  No current symptoms.    Current Medications (verified): 1)  Isosorbide Dinitrate 10 Mg  Tabs (Isosorbide Dinitrate) .Marland Kitchen.. 1 By Mouth Three Times A Day 2)  Toprol Xl 25 Mg Xr24h-Tab (Metoprolol Succinate) .... 2 By Mouth Once Daily 3)  Niaspan 500 Mg  Tbcr (Niacin (Antihyperlipidemic)) .Marland Kitchen.. 1 By Mouth Once Daily 4)  Bayer Low Strength 81 Mg Tbec (Aspirin) .Marland Kitchen.. 1 By Mouth Once Daily 5)  Antioxidant  Tabs (Multiple Vitamins-Minerals) .... Take 1 Tablet By Mouth Once A Day 6)  Coq10 100 Mg Caps (Coenzyme Q10) .Marland Kitchen.. 1 By Mouth Once Daily 7)  Nitroglycerin 0.4 Mg Subl (Nitroglycerin) .... One Tablet Under Tongue Every 5 Minutes As Needed For Chest Pain---May Repeat Times Three 8)  Vitamin D 3  5,000 Iu .... Take 1 Capsule By Mouth Once A Day 9)  Mucus Relief 400 Mg Tabs (Guaifenesin) .... As Needed 10)  Zinc 50 Mg Tabs (Zinc) .... One A Day 11)  Selenium 100 Mcg Tabs (Selenium) .... Take 1 Tablet By Mouth Once A Day 12)  Fish Oil 500 Mg Caps (Omega-3 Fatty Acids) .... Take 1 Capsule By Mouth Once A Day  Allergies: 1)  ! * Antihistamines 2)  ! * Deconsal 3)  ! * Cefzil 4)  ! * Sleeping Pills 5)  ! Zocor  Vital Signs:  Patient profile:   77 year old male Height:      68 inches Weight:      173.25 pounds BMI:     26.44 Pulse rate:   62 / minute Pulse rhythm:   regular Resp:     18 per minute BP sitting:   126 / 60  (left arm) Cuff size:   regular  Vitals Entered By: Vikki Ports (Mar 04, 2010 4:12 PM)  Physical Exam  General:  Well developed, well nourished, in no acute distress. Head:  normocephalic and atraumatic Eyes:  PERRLA/EOM intact; conjunctiva and lids normal. Neck:  No carotid bruits.  Lungs:  Slightly prolonged expiration, with slight wheeze.  No rales. Heart:  PMI non displaced.   Normal S1 and S2.  Without murmur or rub. Abdomen:  Bowel sounds positive; abdomen soft and non-tender without masses, organomegaly, or hernias noted. No hepatosplenomegaly. Pulses:  pulses normal in all 4 extremities Extremities:  No clubbing or cyanosis.  no edema Neurologic:  Alert and oriented x 3.   EKG  Procedure date:  03/04/2010  Findings:      NSR.  WNL.    Impression & Recommendations:  Problem # 1:  C A D (ICD-414.00) remains perfectly stable on a medical regimen.  Prior DES stenting.  Off clopidogrel for more than one year, without any issues.  Continuing on treatment.  Stable status. His updated medication list for this problem includes:    Isosorbide Dinitrate 10 Mg Tabs (Isosorbide dinitrate) .Marland Kitchen... 1 by mouth three times a day    Toprol Xl 25 Mg Xr24h-tab (Metoprolol succinate) .Marland Kitchen... 2 by mouth once daily    Bayer Low Strength 81 Mg Tbec (Aspirin) .Marland Kitchen... 1 by mouth once daily    Nitroglycerin 0.4 Mg Subl (Nitroglycerin) ..... One tablet under tongue every 5 minutes as needed for chest pain---may repeat times three  Orders: EKG w/ Interpretation (93000)  Problem # 2:  HYPERCHOLESTEROLEMIA  IIA (ICD-272.0) Currently followed by Dr. Alwyn Ren.  Also takes fish oil.   His updated medication list for this problem includes:    Niaspan 500 Mg Tbcr (Niacin (antihyperlipidemic)) .Marland Kitchen... 1 by mouth once daily  Orders: EKG w/ Interpretation (93000)  Problem # 3:  HYPERTENSION, UNSPECIFIED (ICD-401.9) BP controlled.   His updated medication list for this problem includes:    Toprol Xl 25 Mg Xr24h-tab (Metoprolol succinate) .Marland Kitchen... 2 by mouth once daily    Bayer Low Strength 81 Mg Tbec (Aspirin) .Marland Kitchen... 1 by mouth once daily  Patient Instructions: 1)  Your physician recommends that you continue on your current medications as directed. Please refer to the Current Medication list given to you today. 2)  Your physician wants you to follow-up in: 1 YEAR.  You will receive a reminder  letter in the mail two months in advance. If you don't receive a letter, please call our office to schedule the follow-up appointment.

## 2010-11-11 NOTE — Assessment & Plan Note (Signed)
Summary: rto 2 weeks.cbs   Vital Signs:  Patient profile:   77 year old male Weight:      181 pounds Temp:     98.1 degrees F oral Pulse rate:   64 / minute Resp:     17 per minute BP sitting:   138 / 60  (left arm) Cuff size:   large  Vitals Entered By: Shonna Chock CMA (June 18, 2010 10:54 AM) CC: 2 Week Follow-up   Primary Care Provider:  Marga Melnick MD  CC:  2 Week Follow-up.  History of Present Illness: Despite increased Coumadin dose from 5 mg M,W,F,& Sun and 2.5 mg T,Th, & Sat  to 5 mg once daily except 2.5 mg on Weds , PT/INR dropped from 1.6 to 1.3. No change in meds or diet. See BP ; it is not monitored @ home but @ the pharmacy it is in same range.  Allergies: 1)  ! * Antihistamines 2)  ! * Deconsal 3)  ! * Cefzil 4)  ! * Sleeping Pills 5)  ! Zocor  Review of Systems ENT:  Denies nosebleeds. CV:  Denies difficulty breathing at night and difficulty breathing while lying down. Resp:  Denies chest pain with inspiration and coughing up blood. GI:  Denies bloody stools and dark tarry stools; Rare rectal bleeding with constipation,"every 10 days". GU:  Denies hematuria. Heme:  Denies abnormal bruising and bleeding.  Physical Exam  General:  in no acute distress; alert,appropriate and cooperative throughout examination Lungs:  Normal respiratory effort, chest expands symmetrically. Lungs : minimal rales w/o increased WOB Heart:  Normal rate and regular rhythm. S1 and S2 normal without gallop, murmur, click, rub .S4 Extremities:  1/2 + right pedal edema.  Support hose LLE Neurologic:  alert & oriented X3.     Impression & Recommendations:  Problem # 1:  DEEP VENOUS THROMBOPHLEBITIS, LEG, LEFT (ICD-453.40)  Orders: Protime ([id number]): suboptimal value  Problem # 2:  COUMADIN THERAPY (ICD-V58.61)  Orders: Prescription Created Electronically [phone])  Complete Medication List: 1)  Isosorbide Dinitrate 10 Mg Tabs (Isosorbide dinitrate) .Marland Kitchen.. 1 by mouth  three times a day 2)  Metoprolol Succinate 50 Mg Xr24h-tab (Metoprolol succinate) .Marland Kitchen.. 1 by mouth once daily 3)  Bayer Low Strength 81 Mg Tbec (Aspirin) .Marland Kitchen.. 1 by mouth once daily 4)  Antioxidant Tabs (Multiple vitamins-minerals) .... Take 1 tablet by mouth once a day 5)  Coq10 100 Mg Caps (Coenzyme q10) .Marland Kitchen.. 1 by mouth once daily 6)  Nitroglycerin 0.4 Mg Subl (Nitroglycerin) .... One tablet under tongue every 5 minutes as needed for chest pain---may repeat times three 7)  Vitamin D 5000 Unit Tabs (cholecalciferol)  .Marland Kitchen.. 1 by mouth once daily 8)  Mucus Relief 400 Mg Tabs (Guaifenesin) .... As needed 9)  Zinc 50 Mg Tabs (Zinc) .... One a day 10)  Selenium 200 Mcg Tabs (Selenium) .Marland Kitchen.. 1 by mouth once daily 11)  Fish Oil 500 Mg Caps (Omega-3 fatty acids) .... Take 1 capsule by mouth once a day 12)  Niaspan 500 Mg Cr-tabs (Niacin (antihyperlipidemic)) .Marland Kitchen.. 1 by mouth at bedtime 13)  Miralax Powd (Polyethylene glycol 3350) .Marland Kitchen.. 17gm every morning 14)  Ensure Plus Liqd (Nutritional supplements) .... Three times a day 15)  Polyethylene Glycol 3350 Powd (Polyethylene glycol 3350) .Marland KitchenMarland KitchenMarland Kitchen 17 grams by mouth as needed 16)  Senna 187 Mg Tabs (Senna) .... 2 by mouth once daily as needed 17)  Simvastatin 20 Mg Tabs (Simvastatin) .Marland Kitchen.. 1 by mouth at bedtime  18)  Coumadin 5 Mg Tabs (Warfarin sodium) .... As directed based on pt/inr check  Patient Instructions: 1)  Coumadin 10 mg TODAY ONLY  then 5 mg once daily EXCEPT 7.5 mg (1& 1/2 pills) on Weds . PT/INR in 2 weeks. 2)  Check your Blood Pressure regularly. If it is above: 140/90 on AVERAGE  you should make an appointment. Prescriptions: COUMADIN 5 MG TABS (WARFARIN SODIUM) as directed based on PT/INR check  #90 x 0   Entered and Authorized by:   Marga Melnick MD   Signed by:   Marga Melnick MD on 06/18/2010   Method used:   Faxed to ...       [facility]  [address] [phone]* (retail)       [address]       [place], [place]   [address]       Ph: [phone]       Fax: [phone]   RxID:   [id number]   Anticoagulant Therapy  PCP: Marga Melnick MD INR POC 1.3  Vital Signs: Weight: 181 lbs.  Pulse Rate: 64  Respirations: 17  Blood Pressure:  138 / 60   Dietary changes: no    Health status changes: no    Bleeding/hemorrhagic complications: no    Recent/future hospitalizations: no    Any changes in medication regimen? no    Recent/future dental: no  Any missed doses?: no       Is patient compliant with meds? yes      Comments: 5 mg. once daily   except 2.5 on Wed pt to see Dr. Katrinka Blazing [facility]  June 18, 2010 10:54 AM

## 2010-11-11 NOTE — Assessment & Plan Note (Signed)
Summary: PT/CHECK//PH ARRIVE AT 11:20  Nurse Visit   Vital Signs:  Patient profile:   77 year old male Weight:      177.8 pounds Pulse rate:   76 / minute BP sitting:   110 / 60  (left arm) Cuff size:   large  Vitals Entered By: Shonna Chock CMA (July 09, 2010 11:35 AM) CC: PT check   Allergies: 1)  ! * Antihistamines 2)  ! * Deconsal 3)  ! * Cefzil 4)  ! * Sleeping Pills 5)  ! Zocor Laboratory Results   Blood Tests      INR: 1.7   (Normal Range: 0.88-1.12   Therap INR: 2.0-3.5)    Orders Added: 1)  Est. Patient Level I [16109] 2)  Protime [[id number]]   ANTICOAGULATION RECORD       NEW REGIMEN & LAB RESULTS Current INR: 1.7 Current Coumadin Dose(mg): 10mg  x 1 day than 7.5mg  M/W/F, 5mg  all other days Regimen: 7.5mg  daily except 5mg  on Wed  Provider: Hopper,William      Repeat testing in: 10 days MEDICATIONS ISOSORBIDE DINITRATE 10 MG  TABS (ISOSORBIDE DINITRATE) 1 by mouth three times a day METOPROLOL SUCCINATE 50 MG XR24H-TAB (METOPROLOL SUCCINATE) 1 by mouth once daily BAYER LOW STRENGTH 81 MG TBEC (ASPIRIN) 1 by mouth once daily ANTIOXIDANT  TABS (MULTIPLE VITAMINS-MINERALS) Take 1 tablet by mouth once a day COQ10 100 MG CAPS (COENZYME Q10) 1 by mouth once daily NITROGLYCERIN 0.4 MG SUBL (NITROGLYCERIN) One tablet under tongue every 5 minutes as needed for chest pain---may repeat times three * VITAMIN D 5000 UNIT TABS (CHOLECALCIFEROL) 1 by mouth once daily MUCUS RELIEF 400 MG TABS (GUAIFENESIN) as needed ZINC 50 MG TABS (ZINC) one a day SELENIUM 200 MCG TABS (SELENIUM) 1 by mouth once daily FISH OIL 500 MG CAPS (OMEGA-3 FATTY ACIDS) Take 1 capsule by mouth once a day NIASPAN 500 MG CR-TABS (NIACIN (ANTIHYPERLIPIDEMIC)) 1 by mouth at bedtime MIRALAX  POWD (POLYETHYLENE GLYCOL 3350) 17gm every morning ENSURE PLUS  LIQD (NUTRITIONAL SUPPLEMENTS) three times a day POLYETHYLENE GLYCOL 3350  POWD (POLYETHYLENE GLYCOL 3350) 17 grams by mouth as  needed SENNA 187 MG TABS (SENNA) 2 by mouth once daily as needed SIMVASTATIN 20 MG TABS (SIMVASTATIN) 1 by mouth at bedtime COUMADIN 5 MG TABS (WARFARIN SODIUM) as directed based on PT/INR check

## 2010-11-11 NOTE — Assessment & Plan Note (Signed)
Summary: pt/cbs  Nurse Visit   Vital Signs:  Patient profile:   77 year old male Height:      68 inches (172.72 cm) Weight:      179 pounds (81.36 kg) BMI:     27.32 Temp:     97.9 degrees F (36.61 degrees C) oral Resp:     17 per minute BP sitting:   110 / 40  (left arm)  Vitals Entered By: Lucious Groves CMA (August 30, 2010 9:34 AM)  Primary Care Provider:  Marga Melnick MD  CC:  Pt check, c/o black stools./kb, and Heartburn.  History of Present Illness: [name] informed nurse tht he has been having black stools when he came in for PT/INR this am..  The patient denies acid reflux, epigastric pain, trouble swallowing, and weight loss.  The patient denies the following alarm features: dysphagia and hematemesis.  He describes "streaks " of black in every 5th stool". No other bleeding dyscrasias. PMH of ulcers & alcohol abuse.   Physical Exam  General:  well-nourished,in no acute distress; alert,appropriate and cooperative throughout examination Eyes:  No corneal or conjunctival inflammation noted.No icterus Mouth:  Oral mucosa and oropharynx without lesions or exudates.  Teeth in good repair. No pharyngeal erythema.   Lungs:  Normal respiratory effort, chest expands symmetrically. Lungs are clear to auscultation, no crackles or wheezes. Heart:  regular rhythm and no gallop.   Abdomen:  Bowel sounds positive,abdomen soft and non-tender without masses, organomegaly or hernias noted. Mid line op scar( he is unable to state reason for op scar but PMH of ulcer) Rectal:  No external abnormalities noted. Normal sphincter tone. No rectal masses or tenderness. FOB negative Prostate:  Prostate gland firm and smooth, no enlargement, nodularity, tenderness, mass, asymmetry or induration. Skin:  Intact without suspicious lesions or rashes Cervical Nodes:  No lymphadenopathy noted Axillary Nodes:  No palpable lymphadenopathy Psych:  memory intact for recent and remote, normally  interactive, and good eye contact.     Review of Systems ENT:  Denies nosebleeds. Resp:  Denies coughing up blood. GU:  Denies hematuria. Heme:  Denies abnormal bruising and bleeding.    Impression & Recommendations:  Problem # 1:  ENCOUNTER FOR LONG-TERM USE OF ANTICOAGULANTS (ICD-V58.61)  Orders: Protime ([id number])  Problem # 2:  NONSPECIFIC ABNORMAL FINDING IN STOOL CONTENTS (ICD-792.1) change in color, R/O melena  Complete Medication List: 1)  Isosorbide Dinitrate 10 Mg Tabs (Isosorbide dinitrate) .Marland Kitchen.. 1 by mouth three times a day 2)  Metoprolol Succinate 50 Mg Xr24h-tab (Metoprolol succinate) .Marland Kitchen.. 1 by mouth once daily 3)  Bayer Low Strength 81 Mg Tbec (Aspirin) .Marland Kitchen.. 1 by mouth once daily 4)  Antioxidant Tabs (Multiple vitamins-minerals) .... Take 1 tablet by mouth once a day 5)  Coq10 100 Mg Caps (Coenzyme q10) .Marland Kitchen.. 1 by mouth once daily 6)  Nitroglycerin 0.4 Mg Subl (Nitroglycerin) .... One tablet under tongue every 5 minutes as needed for chest pain---may repeat times three 7)  Vitamin D 5000 Unit Tabs (cholecalciferol)  .Marland Kitchen.. 1 by mouth once daily 8)  Mucus Relief 400 Mg Tabs (Guaifenesin) .... As needed 9)  Zinc 50 Mg Tabs (Zinc) .... One a day 10)  Selenium 200 Mcg Tabs (Selenium) .Marland Kitchen.. 1 by mouth once daily 11)  Fish Oil 500 Mg Caps (Omega-3 fatty acids) .... Take 1 capsule by mouth once a day 12)  Niaspan 500 Mg Cr-tabs (Niacin (antihyperlipidemic)) .Marland Kitchen.. 1 by mouth at bedtime 13)  Ensure Plus Liqd (Nutritional  supplements) .... Three times a day 14)  Senna 187 Mg Tabs (Senna) .... 2 by mouth once daily as needed 15)  Simvastatin 20 Mg Tabs (Simvastatin) .Marland Kitchen.. 1 by mouth at bedtime 16)  Coumadin 5 Mg Tabs (Warfarin sodium) .... As directed based on pt/inr check  Other Orders: Specimen Handling (16109) Flu Vaccine 81yrs + MEDICARE PATIENTS (U0454) Administration Flu vaccine - MCR (U9811)   Patient Instructions: 1)  No change in warfarin dose; recheck PT/INR in 2  weeks. Complete stool cards.   Past History:  Past Surgical History: Exploratory surgery  for kidney problems (1978) Thoracotomy-hemothorax (1982) Surgery for cervical spondylosis (2003) C-spine (2003) Cath. (06-21-03) Stent placement (07/2003) X 3 Cath. (05/2004) -stent Colonscopy-polyp (11/2006)  CC: Pt check, c/o black stools./kb, Heartburn Is Patient Diabetic? No Pain Assessment Patient in pain? no      Comments Patient came for PT check and c/o black stools./kb   Current Medications (verified): 1)  Isosorbide Dinitrate 10 Mg  Tabs (Isosorbide Dinitrate) .Marland Kitchen.. 1 By Mouth Three Times A Day 2)  Metoprolol Succinate 50 Mg Xr24h-Tab (Metoprolol Succinate) .Marland Kitchen.. 1 By Mouth Once Daily 3)  Bayer Low Strength 81 Mg Tbec (Aspirin) .Marland Kitchen.. 1 By Mouth Once Daily 4)  Antioxidant  Tabs (Multiple Vitamins-Minerals) .... Take 1 Tablet By Mouth Once A Day 5)  Coq10 100 Mg Caps (Coenzyme Q10) .Marland Kitchen.. 1 By Mouth Once Daily 6)  Nitroglycerin 0.4 Mg Subl (Nitroglycerin) .... One Tablet Under Tongue Every 5 Minutes As Needed For Chest Pain---May Repeat Times Three 7)  Vitamin D 5000 Unit Tabs (Cholecalciferol) .Marland Kitchen.. 1 By Mouth Once Daily 8)  Mucus Relief 400 Mg Tabs (Guaifenesin) .... As Needed 9)  Zinc 50 Mg Tabs (Zinc) .... One A Day 10)  Selenium 200 Mcg Tabs (Selenium) .Marland Kitchen.. 1 By Mouth Once Daily 11)  Fish Oil 500 Mg Caps (Omega-3 Fatty Acids) .... Take 1 Capsule By Mouth Once A Day 12)  Niaspan 500 Mg Cr-Tabs (Niacin (Antihyperlipidemic)) .Marland Kitchen.. 1 By Mouth At Bedtime 13)  Ensure Plus  Liqd (Nutritional Supplements) .... Three Times A Day 14)  Senna 187 Mg Tabs (Senna) .... 2 By Mouth Once Daily As Needed 15)  Simvastatin 20 Mg Tabs (Simvastatin) .Marland Kitchen.. 1 By Mouth At Bedtime 16)  Coumadin 5 Mg Tabs (Warfarin Sodium) .... As Directed Based On Pt/inr Check  Allergies (verified): 1)  ! * Antihistamines 2)  ! * Deconsal 3)  ! * Cefzil 4)  ! * Sleeping Pills 5)  ! Zocor Laboratory Results   Blood  Tests      INR: 2.2   (Normal Range: 0.88-1.12   Therap INR: 2.0-3.5)    Orders Added: 1)  Est. Patient Level I [91478] 2)  Protime [[id number]] 3)  Specimen Handling [99000] 4)  Flu Vaccine 58yrs + MEDICARE PATIENTS [Q2039] 5)  Administration Flu vaccine - MCR [G0008] 6)  Est. Patient Level III [08657]   ANTICOAGULATION RECORD PREVIOUS REGIMEN & LAB RESULTS   Previous INR:  2.6 on  08/09/2010 Previous Coumadin Dose(mg):  1 1/2 tab(7.5mg ) except wed 1 tab(5mg ) on  08/09/2010 Previous Regimen:  same on  08/09/2010  NEW REGIMEN & LAB RESULTS Anticoag. Dx: Deep venous thrombosis Current INR: 2.2 Current Coumadin Dose(mg): 7.5mg  qd except for 5mg  on Wed. Regimen: same  (no change)  Other Comments: Patient notes that he has 5mg  tabs at home and takes 7.5mg  once daily except for 5mg  on Wed. Lucious Groves CMA  August 30, 2010 9:40 AM  Anticoagulation Visit Questionnaire Coumadin dose missed/changed:  No Abnormal Bleeding Symptoms:  Yes    Bruising or bleeding from nose or gums, in urine or stool since the last visit:  Patient notes that he does bruise easily. Any diet changes including alcohol intake, vegetables or greens since the last visit:  No Any illnesses or hospitalizations since the last visit:  No Any signs of clotting since the last visit (including chest discomfort, dizziness, shortness of breath, arm tingling, slurred speech, swelling or redness in leg):  No  MEDICATIONS ISOSORBIDE DINITRATE 10 MG  TABS (ISOSORBIDE DINITRATE) 1 by mouth three times a day METOPROLOL SUCCINATE 50 MG XR24H-TAB (METOPROLOL SUCCINATE) 1 by mouth once daily BAYER LOW STRENGTH 81 MG TBEC (ASPIRIN) 1 by mouth once daily ANTIOXIDANT  TABS (MULTIPLE VITAMINS-MINERALS) Take 1 tablet by mouth once a day COQ10 100 MG CAPS (COENZYME Q10) 1 by mouth once daily NITROGLYCERIN 0.4 MG SUBL (NITROGLYCERIN) One tablet under tongue every 5 minutes as needed for chest pain---may repeat times three *  VITAMIN D 5000 UNIT TABS (CHOLECALCIFEROL) 1 by mouth once daily MUCUS RELIEF 400 MG TABS (GUAIFENESIN) as needed ZINC 50 MG TABS (ZINC) one a day SELENIUM 200 MCG TABS (SELENIUM) 1 by mouth once daily FISH OIL 500 MG CAPS (OMEGA-3 FATTY ACIDS) Take 1 capsule by mouth once a day NIASPAN 500 MG CR-TABS (NIACIN (ANTIHYPERLIPIDEMIC)) 1 by mouth at bedtime ENSURE PLUS  LIQD (NUTRITIONAL SUPPLEMENTS) three times a day SENNA 187 MG TABS (SENNA) 2 by mouth once daily as needed SIMVASTATIN 20 MG TABS (SIMVASTATIN) 1 by mouth at bedtime COUMADIN 5 MG TABS (WARFARIN SODIUM) as directed based on PT/INR check             Flu Vaccine Consent Questions     Do you have a history of severe allergic reactions to this vaccine? no    Any prior history of allergic reactions to egg and/or gelatin? no    Do you have a sensitivity to the preservative Thimersol? no    Do you have a past history of Guillan-Barre Syndrome? no    Do you currently have an acute febrile illness? no    Have you ever had a severe reaction to latex? no    Vaccine information given and explained to patient? yes    Are you currently pregnant? no    Lot Number:[id number]   Exp Date:04/09/2011   Site Given  Left Deltoid IMflu

## 2010-11-11 NOTE — Progress Notes (Signed)
Summary: PT/INR Concerns  Phone Note From Other Clinic   Caller: Buzz: [facility] Summary of Call: PT/INR 17.5/ 1.5  2 lovonox injections left, 5mg  daily No missed doses no chang in diet  **Per Dr.Hopper, he did not auth for patient to have PT/INR checked through [facility], If patient would like it managed that way he will need to be referred to the [facility]madin [facility].   I dicussed this info with Vinetta Bergamo and he will share with the patient and have him contact our office for an appointment Shonna Chock CMA  May 11, 2010 11:13 AM

## 2010-11-11 NOTE — Assessment & Plan Note (Signed)
Summary: 2 week followup, pt visit///sph   Vital Signs:  Patient profile:   77 year old male Weight:      178.6 pounds BMI:     27.25 Temp:     97.7 degrees F oral Pulse rate:   64 / minute Resp:     15 per minute BP sitting:   130 / 68  (left arm) Cuff size:   large  Vitals Entered By: Shonna Chock CMA (September 13, 2010 10:34 AM) CC: 2 week follow-up(patient with ongoing black stoold) and PT check , Heartburn   Primary Care Asami Lambright:  Marga Melnick MD  CC:  2 week follow-up(patient with ongoing black stoold) and PT check  and Heartburn.  History of Present Illness: Labs reviewed : HCT 37.4  11/21 & 33.7 in 04/2010. FOB cards negative X3 but stools remain dark. He is eating increased chard & spinach, @ least 2X/ week.He is aware that this may impact PT/INR.  The patient denies acid reflux, sour taste in mouth, epigastric pain, chest pain, trouble swallowing, and weight loss.  The patient denies the following alarm features: dysphagia, hematemesis, and vomiting. PMH of PUD in conntext of drinking.   Current Medications (verified): 1)  Isosorbide Dinitrate 10 Mg  Tabs (Isosorbide Dinitrate) .Marland Kitchen.. 1 By Mouth Three Times A Day 2)  Metoprolol Succinate 50 Mg Xr24h-Tab (Metoprolol Succinate) .Marland Kitchen.. 1 By Mouth Once Daily 3)  Bayer Low Strength 81 Mg Tbec (Aspirin) .Marland Kitchen.. 1 By Mouth Once Daily 4)  Antioxidant  Tabs (Multiple Vitamins-Minerals) .... Take 1 Tablet By Mouth Once A Day 5)  Coq10 100 Mg Caps (Coenzyme Q10) .Marland Kitchen.. 1 By Mouth Once Daily 6)  Nitroglycerin 0.4 Mg Subl (Nitroglycerin) .... One Tablet Under Tongue Every 5 Minutes As Needed For Chest Pain---May Repeat Times Three 7)  Vitamin D 5000 Unit Tabs (Cholecalciferol) .Marland Kitchen.. 1 By Mouth Once Daily 8)  Mucus Relief 400 Mg Tabs (Guaifenesin) .... As Needed 9)  Zinc 50 Mg Tabs (Zinc) .... One A Day 10)  Selenium 200 Mcg Tabs (Selenium) .Marland Kitchen.. 1 By Mouth Once Daily 11)  Fish Oil 500 Mg Caps (Omega-3 Fatty Acids) .... Take 1 Capsule By  Mouth Once A Day 12)  Niaspan 500 Mg Cr-Tabs (Niacin (Antihyperlipidemic)) .Marland Kitchen.. 1 By Mouth At Bedtime 13)  Ensure Plus  Liqd (Nutritional Supplements) .... Three Times A Day 14)  Senna 187 Mg Tabs (Senna) .... 2 By Mouth Once Daily As Needed 15)  Simvastatin 20 Mg Tabs (Simvastatin) .Marland Kitchen.. 1 By Mouth At Bedtime 16)  Coumadin 5 Mg Tabs (Warfarin Sodium) .... As Directed Based On Pt/inr Check  Allergies: 1)  ! * Antihistamines 2)  ! * Deconsal 3)  ! * Cefzil 4)  ! * Sleeping Pills 5)  ! Zocor  Past History:  Past Surgical History: Exploratory surgery  for kidney problems (1978) Thoracotomy-hemothorax (1610) Surgery for cervical spondylosis (2003) C-spine (2003) Catheterization (06-21-03) Stent placement (07/2003) X 3 Cath. (05/2004) -stent Colonscopy:polyp (11/2006)  Physical Exam  General:  Well-developed,well-nourished,in no acute distress; alert,appropriate and cooperative throughout examination Eyes:  No corneal or conjunctival inflammation noted. No icterus Mouth:  Oral mucosa and oropharynx without lesions or exudates.  No pharyngeal erythema.   Lungs:  Normal respiratory effort, chest expands symmetrically. Lungs are quiet Heart:  Normal rate and regular rhythm. S1 and S2 normal without gallop, murmur, click, rub or other extra sounds. Abdomen:  Bowel sounds positive,abdomen soft and non-tender without masses, organomegaly or hernias noted. Skin:  No jaundice  Cervical Nodes:  No lymphadenopathy noted Axillary Nodes:  No palpable lymphadenopathy   Impression & Recommendations:  Problem # 1:  ANEMIA, MILD (ICD-285.9)  improved since 07/11  Orders: Venipuncture (09811) TLB-CBC Platelet - w/Differential (85025-CBCD) TLB-B12 + Folate Pnl (91478_29562-Z30/QMV) TLB-IBC Pnl (Iron/FE;Transferrin) (83550-IBC)  Problem # 2:  NONSPECIFIC ABNORMAL FINDING IN STOOL CONTENTS (ICD-792.1) dark stool probably from greens in diet  Problem # 3:  ENCOUNTER FOR LONG-TERM USE OF  ANTICOAGULANTS (ICD-V58.61)  Orders: Protime (78469GE)  Complete Medication List: 1)  Isosorbide Dinitrate 10 Mg Tabs (Isosorbide dinitrate) .Marland Kitchen.. 1 by mouth three times a day 2)  Metoprolol Succinate 50 Mg Xr24h-tab (Metoprolol succinate) .Marland Kitchen.. 1 by mouth once daily 3)  Bayer Low Strength 81 Mg Tbec (Aspirin) .Marland Kitchen.. 1 by mouth once daily 4)  Antioxidant Tabs (Multiple vitamins-minerals) .... Take 1 tablet by mouth once a day 5)  Coq10 100 Mg Caps (Coenzyme q10) .Marland Kitchen.. 1 by mouth once daily 6)  Nitroglycerin 0.4 Mg Subl (Nitroglycerin) .... One tablet under tongue every 5 minutes as needed for chest pain---may repeat times three 7)  Vitamin D 5000 Unit Tabs (cholecalciferol)  .Marland Kitchen.. 1 by mouth once daily 8)  Mucus Relief 400 Mg Tabs (Guaifenesin) .... As needed 9)  Zinc 50 Mg Tabs (Zinc) .... One a day 10)  Selenium 200 Mcg Tabs (Selenium) .Marland Kitchen.. 1 by mouth once daily 11)  Fish Oil 500 Mg Caps (Omega-3 fatty acids) .... Take 1 capsule by mouth once a day 12)  Niaspan 500 Mg Cr-tabs (Niacin (antihyperlipidemic)) .Marland Kitchen.. 1 by mouth at bedtime 13)  Ensure Plus Liqd (Nutritional supplements) .... Three times a day 14)  Senna 187 Mg Tabs (Senna) .... 2 by mouth once daily as needed 15)  Simvastatin 20 Mg Tabs (Simvastatin) .Marland Kitchen.. 1 by mouth at bedtime 16)  Coumadin 5 Mg Tabs (Warfarin sodium) .... As directed based on pt/inr check  Patient Instructions: 1)  No change in warfain ; recheck in 4 weeks UNLESS you increase greens in diet significantly . Check PT/INR 1 week after such diet changes.   Orders Added: 1)  Protime [85610QW] 2)  Est. Patient Level III [95284] 3)  Venipuncture [36415] 4)  TLB-CBC Platelet - w/Differential [85025-CBCD] 5)  TLB-B12 + Folate Pnl [82746_82607-B12/FOL] 6)  TLB-IBC Pnl (Iron/FE;Transferrin) [83550-IBC]     ANTICOAGULATION RECORD PREVIOUS REGIMEN & LAB RESULTS Anticoagulation Diagnosis:  Deep venous thrombosis on  08/30/2010  Previous INR:  2.2 on   08/30/2010 Previous Coumadin Dose(mg):  7.5mg  qd except for 5mg  on Wed. on  08/30/2010 Previous Regimen:  same on  08/09/2010  NEW REGIMEN & LAB RESULTS Current INR: 2.2 Current Coumadin Dose(mg): 7.5mg  daily except 5mg  on Wed  Regimen: same  (no change)  Meda Dudzinski: Ali,Daniel MEDICATIONS ISOSORBIDE DINITRATE 10 MG  TABS (ISOSORBIDE DINITRATE) 1 by mouth three times a day METOPROLOL SUCCINATE 50 MG XR24H-TAB (METOPROLOL SUCCINATE) 1 by mouth once daily BAYER LOW STRENGTH 81 MG TBEC (ASPIRIN) 1 by mouth once daily ANTIOXIDANT  TABS (MULTIPLE VITAMINS-MINERALS) Take 1 tablet by mouth once a day COQ10 100 MG CAPS (COENZYME Q10) 1 by mouth once daily NITROGLYCERIN 0.4 MG SUBL (NITROGLYCERIN) One tablet under tongue every 5 minutes as needed for chest pain---may repeat times three * VITAMIN D 5000 UNIT TABS (CHOLECALCIFEROL) 1 by mouth once daily MUCUS RELIEF 400 MG TABS (GUAIFENESIN) as needed ZINC 50 MG TABS (ZINC) one a day SELENIUM 200 MCG TABS (SELENIUM) 1 by mouth once daily FISH OIL 500 MG CAPS (OMEGA-3 FATTY ACIDS)  Take 1 capsule by mouth once a day NIASPAN 500 MG CR-TABS (NIACIN (ANTIHYPERLIPIDEMIC)) 1 by mouth at bedtime ENSURE PLUS  LIQD (NUTRITIONAL SUPPLEMENTS) three times a day SENNA 187 MG TABS (SENNA) 2 by mouth once daily as needed SIMVASTATIN 20 MG TABS (SIMVASTATIN) 1 by mouth at bedtime COUMADIN 5 MG TABS (WARFARIN SODIUM) as directed based on PT/INR check   Anticoagulation Visit Questionnaire      Coumadin dose missed/changed:  No      Abnormal Bleeding Symptoms:  No   Any diet changes including alcohol intake, vegetables or greens since the last visit:  No Any illnesses or hospitalizations since the last visit:  No Any signs of clotting since the last visit (including chest discomfort, dizziness, shortness of breath, arm tingling, slurred speech, swelling or redness in leg):  No   Appended Document: 2 week followup, pt visit///sph

## 2010-11-11 NOTE — Assessment & Plan Note (Signed)
Summary: hosptial followup/legs are still red//kn   Vital Signs:  Patient profile:   76 year old male Weight:      174.8 pounds Temp:     98.1 degrees F oral Pulse rate:   72 / minute Resp:     17 per minute BP sitting:   124 / 60  (left arm) Cuff size:   large  Vitals Entered By: Shonna Chock CMA (May 04, 2010 8:07 AM) CC: Hospital follow-up   Primary Care Provider:  Marga Melnick MD  CC:  Hospital follow-up.  History of Present Illness: His LLE remains swollen & he questions temp increase. 03/14/2010  D/C Summary  reviewed ;in  Rehab 06/05-07/19/2011. Stefano Gaul ,his neighbor & "right hand person" accompanies him & answers most questions as he seems unsure as to dates & events surrounding the fall. Renal insufficiency documented in hospital.   Current Medications (verified): 1)  Isosorbide Dinitrate 10 Mg  Tabs (Isosorbide Dinitrate) .Marland Kitchen.. 1 By Mouth Three Times A Day 2)  Metoprolol Succinate 50 Mg Xr24h-Tab (Metoprolol Succinate) .Marland Kitchen.. 1 By Mouth Once Daily 3)  Niaspan 500 Mg  Tbcr (Niacin (Antihyperlipidemic)) .Marland Kitchen.. 1 By Mouth Once Daily 4)  Bayer Low Strength 81 Mg Tbec (Aspirin) .Marland Kitchen.. 1 By Mouth Once Daily 5)  Antioxidant  Tabs (Multiple Vitamins-Minerals) .... Take 1 Tablet By Mouth Once A Day 6)  Coq10 100 Mg Caps (Coenzyme Q10) .Marland Kitchen.. 1 By Mouth Once Daily 7)  Nitroglycerin 0.4 Mg Subl (Nitroglycerin) .... One Tablet Under Tongue Every 5 Minutes As Needed For Chest Pain---May Repeat Times Three 8)  Vitamin D 1000 Unit Tabs (Cholecalciferol) .Marland Kitchen.. 1 By Mouth Once Daily 9)  Mucus Relief 400 Mg Tabs (Guaifenesin) .... As Needed 10)  Zinc 50 Mg Tabs (Zinc) .... One A Day 11)  Selenium 200 Mcg Tabs (Selenium) .Marland Kitchen.. 1 By Mouth Once Daily 12)  Fish Oil 500 Mg Caps (Omega-3 Fatty Acids) .... Take 1 Capsule By Mouth Once A Day 13)  Niaspan 500 Mg Cr-Tabs (Niacin (Antihyperlipidemic)) .Marland Kitchen.. 1 By Mouth At Bedtime 14)  Multivitamins  Tabs (Multiple Vitamin) .Marland Kitchen.. 1 By Mouth Once Daily 15)   Miralax  Powd (Polyethylene Glycol 3350) .Marland Kitchen.. 17gm Every Morning 16)  Ensure Plus  Liqd (Nutritional Supplements) .... Three Times A Day  Allergies: 1)  ! * Antihistamines 2)  ! * Deconsal 3)  ! * Cefzil 4)  ! * Sleeping Pills 5)  ! Zocor  Review of Systems General:  Denies chills, fever, sweats, and weight loss. CV:  Denies chest pain or discomfort and palpitations. Resp:  Denies chest pain with inspiration, coughing up blood, shortness of breath, and wheezing.  Physical Exam  General:  in no acute distress; alert,appropriate and cooperative throughout examination Eyes:  No corneal or conjunctival inflammation noted. EOMI. Perrla. Field of  Vision grossly normal. Lungs:  Normal respiratory effort, chest expands symmetrically. Lungs are clear to auscultation, no crackles or wheezes. Heart:  regular rhythm, no murmur, no gallop, no rub, no JVD, no HJR, and bradycardia.   S4 with sluring Pulses:  R and L carotid,radial,dorsalis pedis and posterior tibial pulses are full and equal bilaterally Extremities:  1+ left pedal edema and trace right pedal edema.  Faint erythema LLE w/o increased heat. Tender to palpation L medial arch & ankle. Neg Homan's Neurologic:  alert & oriented X3, cranial nerves II-XII intact, strength normal in all extremities, and DTRs symmetrical and normal.  MMSE :28   of 30  Skin:  See LLE  Psych:  normally interactive, good eye contact, not anxious appearing, and not depressed appearing.     Impression & Recommendations:  Problem # 1:  EDEMA- LOCALIZED (ICD-782.3)  LLE ; R/O DVT  Orders: Venipuncture (16109) Specimen Handling (60454) Radiology Referral (Radiology) TLB-CBC Platelet - w/Differential (85025-CBCD) TLB-Creatinine, Blood (82565-CREA) TLB-BUN (Urea Nitrogen) (84520-BUN)  Problem # 2:  FOOT PAIN, LEFT (ICD-729.5)  ankle & arch ; R/O fracture  Orders: Venipuncture (09811) Specimen Handling (91478) TLB-CBC Platelet - w/Differential  (85025-CBCD)  Problem # 3:  RENAL INSUFFICIENCY (ICD-588.9)  Orders: Venipuncture (29562) Specimen Handling (13086) TLB-Creatinine, Blood (82565-CREA) TLB-Potassium (K+) (84132-K) TLB-BUN (Urea Nitrogen) (84520-BUN)  Complete Medication List: 1)  Isosorbide Dinitrate 10 Mg Tabs (Isosorbide dinitrate) .Marland Kitchen.. 1 by mouth three times a day 2)  Metoprolol Succinate 50 Mg Xr24h-tab (Metoprolol succinate) .Marland Kitchen.. 1 by mouth once daily 3)  Niaspan 500 Mg Tbcr (Niacin (antihyperlipidemic)) .Marland Kitchen.. 1 by mouth once daily 4)  Bayer Low Strength 81 Mg Tbec (Aspirin) .Marland Kitchen.. 1 by mouth once daily 5)  Antioxidant Tabs (Multiple vitamins-minerals) .... Take 1 tablet by mouth once a day 6)  Coq10 100 Mg Caps (Coenzyme q10) .Marland Kitchen.. 1 by mouth once daily 7)  Nitroglycerin 0.4 Mg Subl (Nitroglycerin) .... One tablet under tongue every 5 minutes as needed for chest pain---may repeat times three 8)  Vitamin D 1000 Unit Tabs (Cholecalciferol) .Marland Kitchen.. 1 by mouth once daily 9)  Mucus Relief 400 Mg Tabs (Guaifenesin) .... As needed 10)  Zinc 50 Mg Tabs (Zinc) .... One a day 11)  Selenium 200 Mcg Tabs (Selenium) .Marland Kitchen.. 1 by mouth once daily 12)  Fish Oil 500 Mg Caps (Omega-3 fatty acids) .... Take 1 capsule by mouth once a day 13)  Niaspan 500 Mg Cr-tabs (Niacin (antihyperlipidemic)) .Marland Kitchen.. 1 by mouth at bedtime 14)  Multivitamins Tabs (Multiple vitamin) .Marland Kitchen.. 1 by mouth once daily 15)  Miralax Powd (Polyethylene glycol 3350) .Marland Kitchen.. 17gm every morning 16)  Ensure Plus Liqd (Nutritional supplements) .... Three times a day  Patient Instructions: 1)  Keep up with date & current events.

## 2010-11-11 NOTE — Assessment & Plan Note (Signed)
Summary: TO DISCUSS  HIS INR/MED//PH   Vital Signs:  Patient profile:   77 year old male Weight:      174.4 pounds Pulse rate:   60 / minute Resp:     17 per minute BP sitting:   120 / 60  (left arm) Cuff size:   large  Vitals Entered By: Shonna Chock CMA (May 12, 2010 9:38 AM) CC: Patient took his last Lovonox injection this am, patient would now like to know what he need to do   Primary Care Provider:  Marga Melnick MD  CC:  Patient took his last Lovonox injection this am and patient would now like to know what he need to do.  History of Present Illness: D/ C Summary in  E -chart  reviewed; PT/INR was 1.5  yesterday. He is on 5 mg of coumadin daily. Last Lovenox completed 05/11/2010. His LLE edema is variable , "depending on my activity".It decreases overnight. He is unsure whether  he  has support hose. No significant cardiopulmonary symptoms.  Allergies: 1)  ! * Antihistamines 2)  ! * Deconsal 3)  ! * Cefzil 4)  ! * Sleeping Pills 5)  ! Zocor  Review of Systems General:  Denies chills, fever, and sweats. CV:  Denies chest pain or discomfort, difficulty breathing at night, difficulty breathing while lying down, and swelling of hands. Resp:  Denies chest pain with inspiration, coughing up blood, shortness of breath, sputum productive, and wheezing. GI:  Denies bloody stools and dark tarry stools. GU:  Denies hematuria. Heme:  Denies abnormal bruising and bleeding.  Physical Exam  General:  in no acute distress; alert,appropriate and cooperative throughout examination Lungs:  Normal respiratory effort, chest expands symmetrically. Lungs are clear to auscultation, no crackles or wheezes. Heart:  Normal rate and regular rhythm. S1 and S2 normal without gallop, murmur, click, rub. S4 with slurring Extremities:  2+ left pedal edema.   Negative Homan's LLE Skin:  Intact without suspicious lesions or rashes. No erythema LLE or change in temp   Impression &  Recommendations:  Problem # 1:  DEEP VENOUS THROMBOPHLEBITIS, LEG, LEFT (ICD-453.40)  Complete Medication List: 1)  Isosorbide Dinitrate 10 Mg Tabs (Isosorbide dinitrate) .Marland Kitchen.. 1 by mouth three times a day 2)  Metoprolol Succinate 50 Mg Xr24h-tab (Metoprolol succinate) .Marland Kitchen.. 1 by mouth once daily 3)  Bayer Low Strength 81 Mg Tbec (Aspirin) .Marland Kitchen.. 1 by mouth once daily 4)  Antioxidant Tabs (Multiple vitamins-minerals) .... Take 1 tablet by mouth once a day 5)  Coq10 100 Mg Caps (Coenzyme q10) .Marland Kitchen.. 1 by mouth once daily 6)  Nitroglycerin 0.4 Mg Subl (Nitroglycerin) .... One tablet under tongue every 5 minutes as needed for chest pain---may repeat times three 7)  Vitamin D 1000 Unit Tabs (Cholecalciferol) .Marland Kitchen.. 1 by mouth once daily 8)  Mucus Relief 400 Mg Tabs (Guaifenesin) .... As needed 9)  Zinc 50 Mg Tabs (Zinc) .... One a day 10)  Selenium 200 Mcg Tabs (Selenium) .Marland Kitchen.. 1 by mouth once daily 11)  Fish Oil 500 Mg Caps (Omega-3 fatty acids) .... Take 1 capsule by mouth once a day 12)  Niaspan 500 Mg Cr-tabs (Niacin (antihyperlipidemic)) .Marland Kitchen.. 1 by mouth at bedtime 13)  Miralax Powd (Polyethylene glycol 3350) .Marland Kitchen.. 17gm every morning 14)  Ensure Plus Liqd (Nutritional supplements) .... Three times a day 15)  Polyethylene Glycol 3350 Powd (Polyethylene glycol 3350) .Marland KitchenMarland KitchenMarland Kitchen 17 grams by mouth as needed 16)  Senna 187 Mg Tabs (Senna) .Marland KitchenMarland KitchenMarland Kitchen  2 by mouth once daily as needed 17)  Simvastatin 20 Mg Tabs (Simvastatin) .Marland Kitchen.. 1 by mouth at bedtime 18)  Spiriva Handihaler 18 Mcg Caps (Tiotropium bromide monohydrate) .... Once daily  Patient Instructions: 1)  Take 10 mg of Coumadin tonight after 5 pm.PT/INR  can be called to me for Coumadin adjustment. Wear Support Hose when up . Elevate L leg when sitting or in bed.

## 2010-11-11 NOTE — Progress Notes (Signed)
Summary: addl info from patient  Phone Note Call from Patient   Summary of Call: patient said he forgot to tell dr Truman Aceituno this at his ov this morning patient ---patient said  his calves became flabby - dr Leanord Hawking indicated caused by not eating well - lack of protein  Initial call taken by: Okey Regal [place],  May 04, 2010 9:08 AM  Follow-up for Phone Call        noted Follow-up by: Marga Melnick MD,  May 04, 2010 4:08 PM

## 2010-11-11 NOTE — Progress Notes (Signed)
Summary: REFILL toprol and Isosorbide  Phone Note Refill Request Message from:  Patient on January 14, 2010 3:06 PM  Refills Requested: Medication #1:  ISOSORBIDE DINITRATE 10 MG  TABS 1 by mouth three times a day  Medication #2:  Dinitrate 10mg   Medication #3:  TOPROL XL 25 MG XR24H-TAB 2 by mouth once daily  Medication #4:  SUCCINATE 25MG  SEND [facility] [phone]  Initial call taken by: Judie Grieve,  January 14, 2010 3:11 PM  Follow-up for Phone Call        Rx called to pharmacy Follow-up by: Vikki Ports,  January 14, 2010 4:59 PM    Prescriptions: TOPROL XL 25 MG XR24H-TAB (METOPROLOL SUCCINATE) 2 by mouth once daily  #60 x 6   Entered by:   Vikki Ports   Authorized by:   Ronaldo Miyamoto, MD, [facility]   Signed by:   Vikki Ports on 01/14/2010   Method used:   Faxed to ...       [facility]  [address] [phone]* (retail)       [address]       [place], [place]  [address]       Ph: [phone]       Fax: [phone]   RxID:   [id number] ISOSORBIDE DINITRATE 10 MG  TABS (ISOSORBIDE DINITRATE) 1 by mouth three times a day  #90 x 6   Entered by:   Vikki Ports   Authorized by:   Ronaldo Miyamoto, MD, [facility]   Signed by:   Vikki Ports on 01/14/2010   Method used:   Faxed to ...       [facility]  [address] [phone]* (retail)       [address]       [place], [place]  [address]       Ph: [phone]       Fax: [phone]   RxID:   [id number]

## 2010-11-11 NOTE — Miscellaneous (Signed)
Summary: Care Plans/[facility]  Care Plans/[facility]   Imported By: Sherian Rein 06/18/2010 07:54:01  _____________________________________________________________________  External Attachment:    Type:   Image     Comment:   External Document

## 2010-11-12 ENCOUNTER — Ambulatory Visit (INDEPENDENT_AMBULATORY_CARE_PROVIDER_SITE_OTHER): Payer: Medicare Other | Admitting: Internal Medicine

## 2010-11-12 ENCOUNTER — Ambulatory Visit: Payer: Medicare Other

## 2010-11-12 ENCOUNTER — Encounter: Payer: Self-pay | Admitting: Internal Medicine

## 2010-11-12 DIAGNOSIS — Z8719 Personal history of other diseases of the digestive system: Secondary | ICD-10-CM

## 2010-11-12 DIAGNOSIS — B379 Candidiasis, unspecified: Secondary | ICD-10-CM | POA: Insufficient documentation

## 2010-11-12 DIAGNOSIS — Z7901 Long term (current) use of anticoagulants: Secondary | ICD-10-CM

## 2010-11-12 DIAGNOSIS — K5900 Constipation, unspecified: Secondary | ICD-10-CM | POA: Insufficient documentation

## 2010-11-12 DIAGNOSIS — I82409 Acute embolism and thrombosis of unspecified deep veins of unspecified lower extremity: Secondary | ICD-10-CM

## 2010-11-12 DIAGNOSIS — R21 Rash and other nonspecific skin eruption: Secondary | ICD-10-CM | POA: Insufficient documentation

## 2010-11-12 LAB — CONVERTED CEMR LAB: POC INR: 2

## 2010-11-15 LAB — CONVERTED CEMR LAB
Basophils Absolute: 0.1 10*3/uL (ref 0.0–0.1)
Hemoglobin: 12.4 g/dL — ABNORMAL LOW (ref 13.0–17.0)
Iron: 71 ug/dL (ref 42–165)
Lymphocytes Relative: 22 % (ref 12–46)
Lymphs Abs: 2.1 10*3/uL (ref 0.7–4.0)
Neutro Abs: 6.3 10*3/uL (ref 1.7–7.7)
Platelets: 276 10*3/uL (ref 150–400)
RDW: 22.2 % — ABNORMAL HIGH (ref 11.5–15.5)
Saturation Ratios: 21 % (ref 20–55)
TIBC: 333 ug/dL (ref 215–435)
TSH: 1.124 microintl units/mL (ref 0.350–4.500)
WBC: 9.7 10*3/uL (ref 4.0–10.5)

## 2010-11-17 ENCOUNTER — Other Ambulatory Visit (INDEPENDENT_AMBULATORY_CARE_PROVIDER_SITE_OTHER): Payer: Medicare Other

## 2010-11-17 ENCOUNTER — Encounter: Payer: Self-pay | Admitting: Internal Medicine

## 2010-11-17 ENCOUNTER — Encounter (INDEPENDENT_AMBULATORY_CARE_PROVIDER_SITE_OTHER): Payer: Self-pay | Admitting: *Deleted

## 2010-11-17 DIAGNOSIS — Z1211 Encounter for screening for malignant neoplasm of colon: Secondary | ICD-10-CM

## 2010-11-17 LAB — CONVERTED CEMR LAB
OCCULT 2: NEGATIVE
OCCULT 3: NEGATIVE

## 2010-11-17 NOTE — Progress Notes (Signed)
Summary: needs PT appt    --made appt  ---- Converted from flag ---- ---- 11/09/2010 10:34 AM, Lucious Groves CMA wrote: Patient is due for PT on 11/12/10. Please call him to schedule. ------------------------------       Additional Follow-up for Phone Call Additional follow up Details #2::    [facility] for patient to call and make PT appt for Fri 2/3.Marland KitchenJerolyn Shin  November 10, 2010 9:30 AM  has appt for 2/3 at 2:00 Follow-up by: Jerolyn Shin,  November 11, 2010 3:47 PM

## 2010-11-17 NOTE — Assessment & Plan Note (Signed)
Summary: dark stool and sore on R foot/cdj   Vital Signs:  Patient profile:   77 year old male Height:      68 inches Weight:      179 pounds BMI:     27.32 O2 Sat:      96 % on Room air Temp:     98.6 degrees F oral Pulse rate:   76 / minute Resp:     15 per minute BP sitting:   102 / 58  (left arm)  Vitals Entered By: Doristine Devoid CMA (November 12, 2010 2:20 PM)  O2 Flow:  Room air CC: dark stool and sore on L foot , Heartburn, Rash   Primary Care Provider:  Marga Melnick MD  CC:  dark stool and sore on L foot , Heartburn, and Rash.  History of Present Illness:     Every stool tarry for 3 weeks; intermittent prior to that period.Constipation X 3 months; Rx: grape & apple juice. He is concerned about  PMH of alcoholism causing bleeding "frm liver". No alcohol since 06/25/1982. The patient denies acid reflux, sour taste in mouth, epigastric pain, chest pain, trouble swallowing, weight loss, and weight gain.  The patient denies the following alarm features: dysphagia, hematemesis, and vomiting.   He is on coumadin for DVT ; PT/INR was 2.0 today. Colon polyp 2008; no PMH of upper Endo.      The patient also presents with Rash X for 2-3 weeks. He lost 2nd toenail 2 weeks ago. He is worried the rash might be related to risk of DVT. The patient reports itching and redness, but denies increased warmth and tenderness.  The rash is located on the right foot.  The patient denies the following symptoms: fever.  Rx: A&D ointment with response.   Current Medications (verified): 1)  Isosorbide Dinitrate 10 Mg  Tabs (Isosorbide Dinitrate) .Marland Kitchen.. 1 By Mouth Three Times A Day 2)  Metoprolol Succinate 50 Mg Xr24h-Tab (Metoprolol Succinate) .Marland Kitchen.. 1 By Mouth Once Daily 3)  Bayer Low Strength 81 Mg Tbec (Aspirin) .Marland Kitchen.. 1 By Mouth Once Daily 4)  Antioxidant  Tabs (Multiple Vitamins-Minerals) .... Take 1 Tablet By Mouth Once A Day 5)  Coq10 100 Mg Caps (Coenzyme Q10) .Marland Kitchen.. 1 By Mouth Once Daily 6)   Nitroglycerin 0.4 Mg Subl (Nitroglycerin) .... One Tablet Under Tongue Every 5 Minutes As Needed For Chest Pain---May Repeat Times Three 7)  Vitamin D 5000 Unit Tabs (Cholecalciferol) .Marland Kitchen.. 1 By Mouth Once Daily 8)  Mucus Relief 400 Mg Tabs (Guaifenesin) .... As Needed 9)  Zinc 50 Mg Tabs (Zinc) .... One A Day 10)  Selenium 200 Mcg Tabs (Selenium) .Marland Kitchen.. 1 By Mouth Once Daily 11)  Fish Oil 500 Mg Caps (Omega-3 Fatty Acids) .... Take 1 Capsule By Mouth Once A Day 12)  Niaspan 500 Mg Cr-Tabs (Niacin (Antihyperlipidemic)) .Marland Kitchen.. 1 By Mouth At Bedtime 13)  Ensure Plus  Liqd (Nutritional Supplements) .... Three Times A Day 14)  Senna 187 Mg Tabs (Senna) .... 2 By Mouth Once Daily As Needed 15)  Simvastatin 20 Mg Tabs (Simvastatin) .Marland Kitchen.. 1 By Mouth At Bedtime 16)  Coumadin 5 Mg Tabs (Warfarin Sodium) .... As Directed Based On Pt/inr Check  Allergies (verified): 1)  ! * Antihistamines 2)  ! * Deconsal 3)  ! * Cefzil 4)  ! * Sleeping Pills 5)  ! Zocor  Physical Exam  General:  Appears younger than age;in no acute distress; alert,appropriate and cooperative throughout examination Eyes:  No corneal or conjunctival inflammation noted. No icterus Mouth:  Oral mucosa and oropharynx without lesions or exudates.  Teeth in good repair. No pharyngeal erythema.   Lungs:  Normal respiratory effort, chest expands symmetrically. Lungs are clear to auscultation, no crackles or wheezes. Heart:  normal rate, regular rhythm, no gallop, no rub, no JVD, no HJR, and grade 1 /6 systolic murmur.   Abdomen:  Bowel sounds positive,abdomen soft and non-tender without masses, organomegaly or hernias noted. Well healed op scar; abdomen well muscled Rectal:  No external abnormalities noted. Normal sphincter tone. No rectal masses or tenderness. FOB negative Pulses:  R and L dorsalis pedis and posterior tibial pulses are full and equal bilaterally Extremities:  No clubbing, cyanosis, edema.  Neurologic:  alert & oriented X3.     Skin:  Intact without suspicious lesions. Minor excoriations  & erythema @ base 3rd & 4th toes. No jaundice. Candida inguinal area Cervical Nodes:  No lymphadenopathy noted Axillary Nodes:  No palpable lymphadenopathy Psych:  memory intact for recent and remote, normally interactive, and good eye contact.     Impression & Recommendations:  Problem # 1:  MELENA, HX OF (ICD-V12.79)  FOB negative  Orders: Venipuncture (25366) TLB-CBC Platelet - w/Differential (85025-CBCD) TLB-IBC Pnl (Iron/FE;Transferrin) (83550-IBC)  Problem # 2:  RASH-NONVESICULAR (ICD-782.1)  excoriations R foot  His updated medication list for this problem includes:    Clotrimazole-betamethasone 1-0.05 % Lotn (Clotrimazole-betamethasone) .Marland Kitchen... Apply twice a day  Orders: Prescription Created Electronically [phone])  Problem # 3:  CANDIDIASIS (ICD-112.9)  groin  Orders: Prescription Created Electronically [id number])  Problem # 4:  CONSTIPATION (ICD-564.00)  His updated medication list for this problem includes:    Senna 187 Mg Tabs (Senna) .Marland Kitchen... 2 by mouth once daily as needed  Orders: TLB-TSH (Thyroid Stimulating Hormone) (84443-TSH)  Complete Medication List: 1)  Isosorbide Dinitrate 10 Mg Tabs (Isosorbide dinitrate) .Marland Kitchen.. 1 by mouth three times a day 2)  Metoprolol Succinate 50 Mg Xr24h-tab (Metoprolol succinate) .Marland Kitchen.. 1 by mouth once daily 3)  Bayer Low Strength 81 Mg Tbec (Aspirin) .Marland Kitchen.. 1 by mouth once daily 4)  Antioxidant Tabs (Multiple vitamins-minerals) .... Take 1 tablet by mouth once a day 5)  Coq10 100 Mg Caps (Coenzyme q10) .Marland Kitchen.. 1 by mouth once daily 6)  Nitroglycerin 0.4 Mg Subl (Nitroglycerin) .... One tablet under tongue every 5 minutes as needed for chest pain---may repeat times three 7)  Vitamin D 5000 Unit Tabs (cholecalciferol)  .Marland Kitchen.. 1 by mouth once daily 8)  Mucus Relief 400 Mg Tabs (Guaifenesin) .... As needed 9)  Zinc 50 Mg Tabs (Zinc) .... One a day 10)  Selenium 200 Mcg Tabs  (Selenium) .Marland Kitchen.. 1 by mouth once daily 11)  Fish Oil 500 Mg Caps (Omega-3 fatty acids) .... Take 1 capsule by mouth once a day 12)  Niaspan 500 Mg Cr-tabs (Niacin (antihyperlipidemic)) .Marland Kitchen.. 1 by mouth at bedtime 13)  Ensure Plus Liqd (Nutritional supplements) .... Three times a day 14)  Senna 187 Mg Tabs (Senna) .... 2 by mouth once daily as needed 15)  Simvastatin 20 Mg Tabs (Simvastatin) .Marland Kitchen.. 1 by mouth at bedtime 16)  Coumadin 5 Mg Tabs (Warfarin sodium) .... As directed based on pt/inr check 17)  Clotrimazole-betamethasone 1-0.05 % Lotn (Clotrimazole-betamethasone) .... Apply twice a day  Other Orders: Protime (56387FI)  Patient Instructions: 1)  Miralax every 3 rd day as needed for constipation. See me before refilling coumadin. Complete stool cards. Prescriptions: CLOTRIMAZOLE-BETAMETHASONE 1-0.05 % LOTN (CLOTRIMAZOLE-BETAMETHASONE) apply twice a  day  #60 grams x 0   Entered and Authorized by:   Marga Melnick MD   Signed by:   Marga Melnick MD on 11/12/2010   Method used:   Electronically to        [facility] [phone]* (retail)       [address]       [place], [place]  [address]       Ph: [phone]       Fax: [phone]   RxID:   [id number]    Orders Added: 1)  Protime [85610QW] 2)  Est. Patient Level IV [44034] 3)  Venipuncture [74259] 4)  TLB-CBC Platelet - w/Differential [85025-CBCD] 5)  TLB-IBC Pnl (Iron/FE;Transferrin) [83550-IBC] 6)  TLB-TSH (Thyroid Stimulating Hormone) [84443-TSH] 7)  Prescription Created Electronically [phone]    Anticoagulant Therapy  Managed by: Marga Melnick PCP: Marga Melnick MD Indication 1: Deep venous thrombosis INR POC 2.0  Vital Signs: Weight: 179 lbs.  Pulse Rate: 76  Respirations: 15  Blood Pressure:  102 / 58   Dietary changes: no    Health status changes: no    Bleeding/hemorrhagic complications: no    Recent/future hospitalizations: no    Any changes in medication regimen?  no    Recent/future dental: no  Any missed doses?: no       Is patient compliant with meds? yes      Comments: patient states current dose: 5mg  Tues. & Thurs. and 7.5mg  all other day ...Marland KitchenMarland KitchenDoristine Devoid CMA  November 12, 2010 2:22 PM    Appended Document: dark stool and sore on R foot/cdj

## 2010-11-25 NOTE — Letter (Signed)
Summary: Results Follow up Letter  [facility]  [address] [place], [place] [address]   Phone: [phone]  Fax: [phone]    11/17/2010 MRN: [id number]  Daniel Ali [address] [place], [place]  [address]  [place]  Dear [name],  The following are the results of your recent test(s):  Test         Result    Pap Smear:        Normal _____  Not Normal _____ Comments: ______________________________________________________ Cholesterol: LDL(Bad cholesterol):         Your goal is less than:         HDL (Good cholesterol):       Your goal is more than: Comments:  ______________________________________________________ Mammogram:        Normal _____  Not Normal _____ Comments:  ___________________________________________________________________ Hemoccult:        Normal __X___  Not normal _______ Comments:    _____________________________________________________________________ Other Tests:    We routinely do not discuss normal results over the telephone.  If you desire a copy of the results, or you have any questions about this information we can discuss them at your next office visit.   Sincerely,

## 2010-12-21 ENCOUNTER — Encounter: Payer: Self-pay | Admitting: Internal Medicine

## 2010-12-23 ENCOUNTER — Encounter: Payer: Self-pay | Admitting: Internal Medicine

## 2010-12-23 ENCOUNTER — Ambulatory Visit (INDEPENDENT_AMBULATORY_CARE_PROVIDER_SITE_OTHER): Payer: Medicare Other

## 2010-12-23 DIAGNOSIS — Z7901 Long term (current) use of anticoagulants: Secondary | ICD-10-CM

## 2010-12-23 DIAGNOSIS — I82409 Acute embolism and thrombosis of unspecified deep veins of unspecified lower extremity: Secondary | ICD-10-CM

## 2010-12-23 LAB — CONVERTED CEMR LAB: POC INR: 2.7

## 2010-12-24 ENCOUNTER — Encounter: Payer: Self-pay | Admitting: Internal Medicine

## 2010-12-24 ENCOUNTER — Encounter (INDEPENDENT_AMBULATORY_CARE_PROVIDER_SITE_OTHER): Payer: Medicare Other

## 2010-12-24 DIAGNOSIS — I82409 Acute embolism and thrombosis of unspecified deep veins of unspecified lower extremity: Secondary | ICD-10-CM

## 2010-12-24 DIAGNOSIS — Z86718 Personal history of other venous thrombosis and embolism: Secondary | ICD-10-CM

## 2010-12-25 LAB — CBC
HCT: 33.7 % — ABNORMAL LOW (ref 39.0–52.0)
Hemoglobin: 11.3 g/dL — ABNORMAL LOW (ref 13.0–17.0)
MCH: 32.8 pg (ref 26.0–34.0)
MCH: 33.3 pg (ref 26.0–34.0)
MCHC: 33.2 g/dL (ref 30.0–36.0)
MCHC: 33.7 g/dL (ref 30.0–36.0)
MCV: 98.9 fL (ref 78.0–100.0)
Platelets: 193 10*3/uL (ref 150–400)
Platelets: 225 10*3/uL (ref 150–400)
RBC: 3.2 MIL/uL — ABNORMAL LOW (ref 4.22–5.81)
RBC: 3.23 MIL/uL — ABNORMAL LOW (ref 4.22–5.81)
RDW: 21.5 % — ABNORMAL HIGH (ref 11.5–15.5)
WBC: 8.8 10*3/uL (ref 4.0–10.5)

## 2010-12-25 LAB — POCT I-STAT, CHEM 8
BUN: 24 mg/dL — ABNORMAL HIGH (ref 6–23)
Calcium, Ion: 1.15 mmol/L (ref 1.12–1.32)
Calcium, Ion: 1.17 mmol/L (ref 1.12–1.32)
Chloride: 104 mEq/L (ref 96–112)
Creatinine, Ser: 1.1 mg/dL (ref 0.4–1.5)
Glucose, Bld: 107 mg/dL — ABNORMAL HIGH (ref 70–99)
Glucose, Bld: 109 mg/dL — ABNORMAL HIGH (ref 70–99)
HCT: 36 % — ABNORMAL LOW (ref 39.0–52.0)
Hemoglobin: 12.2 g/dL — ABNORMAL LOW (ref 13.0–17.0)
Hemoglobin: 12.2 g/dL — ABNORMAL LOW (ref 13.0–17.0)
Potassium: 4.6 mEq/L (ref 3.5–5.1)
TCO2: 29 mmol/L (ref 0–100)

## 2010-12-25 LAB — COMPREHENSIVE METABOLIC PANEL
Albumin: 3 g/dL — ABNORMAL LOW (ref 3.5–5.2)
BUN: 16 mg/dL (ref 6–23)
Calcium: 8.6 mg/dL (ref 8.4–10.5)
Chloride: 105 mEq/L (ref 96–112)
Creatinine, Ser: 1.1 mg/dL (ref 0.4–1.5)
GFR calc Af Amer: >60 mL/min (ref >60)
Total Bilirubin: 0.8 mg/dL (ref 0.3–1.2)

## 2010-12-25 LAB — PROTIME-INR
INR: 1.08 (ref 0.00–1.49)
INR: 1.19 (ref 0.00–1.49)
INR: 1.21 (ref 0.00–1.49)
Prothrombin Time: 13.9 seconds (ref 11.6–15.2)
Prothrombin Time: 15.2 seconds (ref 11.6–15.2)

## 2010-12-25 LAB — APTT: aPTT: 33 seconds (ref 24–37)

## 2010-12-27 LAB — DIFFERENTIAL
Basophils Absolute: 0 10*3/uL (ref 0.0–0.1)
Basophils Absolute: 0 10*3/uL (ref 0.0–0.1)
Basophils Relative: 0 % (ref 0–1)
Basophils Relative: 0 % (ref 0–1)
Basophils Relative: 0 % (ref 0–1)
Eosinophils Absolute: 0 10*3/uL (ref 0.0–0.7)
Eosinophils Absolute: 0.2 10*3/uL (ref 0.0–0.7)
Eosinophils Absolute: 0 10*3/uL (ref 0.0–0.7)
Eosinophils Relative: 0 % (ref 0–5)
Eosinophils Relative: 1 % (ref 0–5)
Eosinophils Relative: 0 % (ref 0–5)
Lymphocytes Relative: 10 % — ABNORMAL LOW (ref 12–46)
Lymphocytes Relative: 17 % (ref 12–46)
Lymphocytes Relative: 6 % — ABNORMAL LOW (ref 12–46)
Lymphocytes Relative: 3 % — ABNORMAL LOW (ref 12–46)
Lymphs Abs: 1.1 10*3/uL (ref 0.7–4.0)
Lymphs Abs: 1.4 10*3/uL (ref 0.7–4.0)
Monocytes Absolute: 0.7 10*3/uL (ref 0.1–1.0)
Monocytes Absolute: 0.7 10*3/uL (ref 0.1–1.0)
Monocytes Absolute: 1 10*3/uL (ref 0.1–1.0)
Monocytes Relative: 3 % (ref 3–12)
Monocytes Relative: 6 % (ref 3–12)
Monocytes Relative: 3 % (ref 3–12)
Neutro Abs: 11.9 10*3/uL — ABNORMAL HIGH (ref 1.7–7.7)
Neutro Abs: 18.9 10*3/uL — ABNORMAL HIGH (ref 1.7–7.7)
Neutro Abs: 5.9 10*3/uL (ref 1.7–7.7)
Neutrophils Relative %: 83 % — ABNORMAL HIGH (ref 43–77)
WBC Morphology: INCREASED
WBC Morphology: INCREASED

## 2010-12-27 LAB — COMPREHENSIVE METABOLIC PANEL
ALT: 104 U/L — ABNORMAL HIGH (ref 0–53)
ALT: 113 U/L — ABNORMAL HIGH (ref 0–53)
AST: 88 U/L — ABNORMAL HIGH (ref 0–37)
Albumin: 2 g/dL — ABNORMAL LOW (ref 3.5–5.2)
Albumin: 2 g/dL — ABNORMAL LOW (ref 3.5–5.2)
Albumin: 2.2 g/dL — ABNORMAL LOW (ref 3.5–5.2)
Albumin: 2.7 g/dL — ABNORMAL LOW (ref 3.5–5.2)
Alkaline Phosphatase: 118 U/L — ABNORMAL HIGH (ref 39–117)
Alkaline Phosphatase: 191 U/L — ABNORMAL HIGH (ref 39–117)
Alkaline Phosphatase: 191 U/L — ABNORMAL HIGH (ref 39–117)
Alkaline Phosphatase: 242 U/L — ABNORMAL HIGH (ref 39–117)
BUN: 23 mg/dL (ref 6–23)
BUN: 24 mg/dL — ABNORMAL HIGH (ref 6–23)
BUN: 35 mg/dL — ABNORMAL HIGH (ref 6–23)
BUN: 42 mg/dL — ABNORMAL HIGH (ref 6–23)
CO2: 26 mEq/L (ref 19–32)
Calcium: 7.8 mg/dL — ABNORMAL LOW (ref 8.4–10.5)
Calcium: 7.9 mg/dL — ABNORMAL LOW (ref 8.4–10.5)
Chloride: 108 mEq/L (ref 96–112)
Creatinine, Ser: 1.76 mg/dL — ABNORMAL HIGH (ref 0.4–1.5)
GFR calc Af Amer: >60 mL/min (ref >60)
Glucose, Bld: 108 mg/dL — ABNORMAL HIGH (ref 70–99)
Glucose, Bld: 95 mg/dL (ref 70–99)
Glucose, Bld: 99 mg/dL (ref 70–99)
Potassium: 3.3 mEq/L — ABNORMAL LOW (ref 3.5–5.1)
Potassium: 3.4 mEq/L — ABNORMAL LOW (ref 3.5–5.1)
Potassium: 3.7 mEq/L (ref 3.5–5.1)
Potassium: 3.7 mEq/L (ref 3.5–5.1)
Sodium: 135 mEq/L (ref 135–145)
Total Bilirubin: 2.3 mg/dL — ABNORMAL HIGH (ref 0.3–1.2)
Total Protein: 4.7 g/dL — ABNORMAL LOW (ref 6.0–8.3)
Total Protein: 4.8 g/dL — ABNORMAL LOW (ref 6.0–8.3)
Total Protein: 4.8 g/dL — ABNORMAL LOW (ref 6.0–8.3)
Total Protein: 5.2 g/dL — ABNORMAL LOW (ref 6.0–8.3)

## 2010-12-27 LAB — CBC
HCT: 27.7 % — ABNORMAL LOW (ref 39.0–52.0)
HCT: 29 % — ABNORMAL LOW (ref 39.0–52.0)
HCT: 29.1 % — ABNORMAL LOW (ref 39.0–52.0)
HCT: 32 % — ABNORMAL LOW (ref 39.0–52.0)
Hemoglobin: 9.4 g/dL — ABNORMAL LOW (ref 13.0–17.0)
Hemoglobin: 9.9 g/dL — ABNORMAL LOW (ref 13.0–17.0)
Hemoglobin: 12.4 g/dL — ABNORMAL LOW (ref 13.0–17.0)
MCHC: 34 g/dL (ref 30.0–36.0)
MCHC: 34 g/dL (ref 30.0–36.0)
MCHC: 33.8 g/dL (ref 30.0–36.0)
MCV: 95.7 fL (ref 78.0–100.0)
MCV: 96.1 fL (ref 78.0–100.0)
MCV: 96.5 fL (ref 78.0–100.0)
MCV: 96.3 fL (ref 78.0–100.0)
Platelets: 130 10*3/uL — ABNORMAL LOW (ref 150–400)
Platelets: 161 10*3/uL (ref 150–400)
Platelets: 176 10*3/uL (ref 150–400)
Platelets: 199 10*3/uL (ref 150–400)
RBC: 3.03 MIL/uL — ABNORMAL LOW (ref 4.22–5.81)
RBC: 3.82 MIL/uL — ABNORMAL LOW (ref 4.22–5.81)
RDW: 21.5 % — ABNORMAL HIGH (ref 11.5–15.5)
RDW: 21.6 % — ABNORMAL HIGH (ref 11.5–15.5)
RDW: 22.3 % — ABNORMAL HIGH (ref 11.5–15.5)
WBC: 13.7 10*3/uL — ABNORMAL HIGH (ref 4.0–10.5)
WBC: 20.7 10*3/uL — ABNORMAL HIGH (ref 4.0–10.5)
WBC: 34.7 10*3/uL — ABNORMAL HIGH (ref 4.0–10.5)

## 2010-12-27 LAB — MAGNESIUM
Magnesium: 2 mg/dL (ref 1.5–2.5)
Magnesium: 2 mg/dL (ref 1.5–2.5)
Magnesium: 2 mg/dL (ref 1.5–2.5)
Magnesium: 2.2 mg/dL (ref 1.5–2.5)

## 2010-12-27 LAB — CARDIAC PANEL(CRET KIN+CKTOT+MB+TROPI)
CK, MB: 3.8 ng/mL (ref 0.3–4.0)
Relative Index: 1.4 (ref 0.0–2.5)
Relative Index: 1.6 (ref 0.0–2.5)
Relative Index: 2.8 — ABNORMAL HIGH (ref 0.0–2.5)
Total CK: 132 U/L (ref 7–232)
Total CK: 239 U/L — ABNORMAL HIGH (ref 7–232)
Total CK: 322 U/L — ABNORMAL HIGH (ref 7–232)
Troponin I: 0.03 ng/mL (ref 0.00–0.06)
Troponin I: 0.08 ng/mL — ABNORMAL HIGH (ref 0.00–0.06)

## 2010-12-27 LAB — D-DIMER, QUANTITATIVE: D-Dimer, Quant: 3.12 ug/mL-FEU — ABNORMAL HIGH (ref 0.00–0.48)

## 2010-12-27 LAB — HEPATITIS PANEL, ACUTE
HCV Ab: NEGATIVE
Hepatitis B Surface Ag: NEGATIVE

## 2010-12-27 LAB — CULTURE, BLOOD (ROUTINE X 2): Culture: NO GROWTH

## 2010-12-27 LAB — POCT I-STAT, CHEM 8
Calcium, Ion: 1.08 mmol/L — ABNORMAL LOW (ref 1.12–1.32)
HCT: 39 % (ref 39.0–52.0)
Hemoglobin: 13.3 g/dL (ref 13.0–17.0)
Sodium: 138 mEq/L (ref 135–145)
TCO2: 26 mmol/L (ref 0–100)

## 2010-12-27 LAB — LACTIC ACID, PLASMA: Lactic Acid, Venous: 2.4 mmol/L — ABNORMAL HIGH (ref 0.5–2.2)

## 2010-12-27 LAB — CK
Total CK: 108 U/L (ref 7–232)
Total CK: 482 U/L — ABNORMAL HIGH (ref 7–232)

## 2010-12-27 LAB — PROCALCITONIN: Procalcitonin: 28.07 ng/mL

## 2010-12-28 NOTE — Miscellaneous (Signed)
Summary: Orders Update  Clinical Lists Changes  Orders: Added new Test order of Venous Duplex Lower Extremity (Venous Duplex Lower) - Signed 

## 2010-12-28 NOTE — Miscellaneous (Signed)
  Clinical Lists Changes  Orders: Added new Test order of LE Venous Duplex (DVT) (DVT) - Signed

## 2010-12-28 NOTE — Medication Information (Signed)
Summary: PT CHECK/KN-  Anticoagulant Therapy  Managed by: Marga Melnick PCP: Marga Melnick MD Indication 1: Deep venous thrombosis INR POC 2.7  Vital Signs: Weight: 177 lbs.  Blood Pressure:  110 / 54   Dietary changes: no    Health status changes: no    Bleeding/hemorrhagic complications: no    Recent/future hospitalizations: no    Any changes in medication regimen? no    Recent/future dental: no  Any missed doses?: no       Is patient compliant with meds? yes      Comments: 5mg  Tues & Thurs. and 7.5mg  all other days .Marland KitchenMarland KitchenMarland KitchenDoristine Devoid CMA  December 23, 2010 12:03 PM  Pt would also like to know about stopping he would like to know before refilling since he is out now.  advised no change recheck 4 weeks...Marland KitchenMarland KitchenDoristine Devoid CMA  December 23, 2010 12:04 PM   left message on machine .Marland KitchenMarland KitchenDoristine Devoid CMA  December 23, 2010 2:20 PM   patient aware of appt scheduled today.Marland KitchenMarland KitchenMarland KitchenDoristine Devoid CMA  December 24, 2010 3:46 PM   Allergies: 1)  ! * Antihistamines 2)  ! * Deconsal 3)  ! * Cefzil 4)  ! * Sleeping Pills 5)  ! Zocor  Impression & Recommendations:  Problem # 1:  ENCOUNTER FOR LONG-TERM USE OF ANTICOAGULANTS (ICD-V58.61)  Orders: Protime ([id number])  Problem # 2:  DEEP VENOUS THROMBOPHLEBITIS, LEG, LEFT (ICD-453.40)  Orders: Protime (44034VQ) Radiology Referral (Radiology)  Complete Medication List: 1)  Isosorbide Dinitrate 10 Mg Tabs (Isosorbide dinitrate) .Marland Kitchen.. 1 by mouth three times a day 2)  Metoprolol Succinate 50 Mg Xr24h-tab (Metoprolol succinate) .Marland Kitchen.. 1 by mouth once daily 3)  Bayer Low Strength 81 Mg Tbec (Aspirin) .Marland Kitchen.. 1 by mouth once daily 4)  Antioxidant Tabs (Multiple vitamins-minerals) .... Take 1 tablet by mouth once a day 5)  Coq10 100 Mg Caps (Coenzyme q10) .Marland Kitchen.. 1 by mouth once daily 6)  Nitroglycerin 0.4 Mg Subl (Nitroglycerin) .... One tablet under tongue every 5 minutes as needed for chest pain---may repeat times three 7)  Vitamin D 5000 Unit Tabs  (cholecalciferol)  .Marland Kitchen.. 1 by mouth once daily 8)  Mucus Relief 400 Mg Tabs (Guaifenesin) .... As needed 9)  Zinc 50 Mg Tabs (Zinc) .... One a day 10)  Selenium 200 Mcg Tabs (Selenium) .Marland Kitchen.. 1 by mouth once daily 11)  Fish Oil 500 Mg Caps (Omega-3 fatty acids) .... Take 1 capsule by mouth once a day 12)  Niaspan 500 Mg Cr-tabs (Niacin (antihyperlipidemic)) .Marland Kitchen.. 1 by mouth at bedtime 13)  Ensure Plus Liqd (Nutritional supplements) .... Three times a day 14)  Senna 187 Mg Tabs (Senna) .... 2 by mouth once daily as needed 15)  Simvastatin 20 Mg Tabs (Simvastatin) .Marland Kitchen.. 1 by mouth at bedtime 16)  Coumadin 5 Mg Tabs (Warfarin sodium) .... As directed based on pt/inr check 17)  Clotrimazole-betamethasone 1-0.05 % Lotn (Clotrimazole-betamethasone) .... Apply twice a day  Anticoagulation Management Assessment/Plan:      The patient's current anticoagulation dose is Coumadin 5 mg tabs: as directed based on PT/INR check.  The next INR is due 4 weeks.  Results were reviewed/authorized by Marga Melnick.         Patient Instructions: 1)  No change ; renew warfarin. Appt after Doppler of legs to discuss continuation of warfarin   Anticoagulation Management History:      Positive risk factors for bleeding include an age of 77 years or older.  Negative risk factors for bleeding include  no history of CVA/TIA.  The bleeding index is 'intermediate risk'.  Positive CHADS2 values include History of HTN and Age > 5 years old.  Negative CHADS2 values include History of Diabetes and Prior Stroke/CVA/TIA.  His last INR was 2.7.  INR POC: 2.7.

## 2010-12-30 ENCOUNTER — Telehealth: Payer: Self-pay | Admitting: *Deleted

## 2010-12-30 NOTE — Telephone Encounter (Signed)
Spoke w/ pt was calling wanting results of doppler results informed pt that per report Dr. Alwyn Ren ok to stop coumadin and f/u in 8 weeks after stopping for coumadin check pt ok'd information.

## 2011-01-01 ENCOUNTER — Other Ambulatory Visit: Payer: Self-pay | Admitting: Cardiology

## 2011-01-03 ENCOUNTER — Other Ambulatory Visit: Payer: Self-pay | Admitting: *Deleted

## 2011-01-03 DIAGNOSIS — I10 Essential (primary) hypertension: Secondary | ICD-10-CM

## 2011-01-03 MED ORDER — METOPROLOL SUCCINATE ER 50 MG PO TB24: 50.0000 mg | ORAL_TABLET | Freq: Every day | ORAL | Status: DC

## 2011-01-06 ENCOUNTER — Other Ambulatory Visit: Payer: Self-pay | Admitting: Cardiology

## 2011-01-06 NOTE — Telephone Encounter (Signed)
[address] °

## 2011-01-06 NOTE — Telephone Encounter (Signed)
done

## 2011-01-17 ENCOUNTER — Encounter: Payer: Self-pay | Admitting: Internal Medicine

## 2011-01-20 ENCOUNTER — Ambulatory Visit: Payer: Medicare Other | Admitting: Internal Medicine

## 2011-01-27 ENCOUNTER — Encounter: Payer: Self-pay | Admitting: Internal Medicine

## 2011-01-27 ENCOUNTER — Ambulatory Visit (INDEPENDENT_AMBULATORY_CARE_PROVIDER_SITE_OTHER): Payer: Medicare Other | Admitting: Internal Medicine

## 2011-01-27 VITALS — BP 110/62 | HR 60 | Temp 98.4°F | Wt 177.8 lb

## 2011-01-27 DIAGNOSIS — K921 Melena: Secondary | ICD-10-CM

## 2011-01-27 DIAGNOSIS — R609 Edema, unspecified: Secondary | ICD-10-CM

## 2011-01-27 DIAGNOSIS — I82409 Acute embolism and thrombosis of unspecified deep veins of unspecified lower extremity: Secondary | ICD-10-CM

## 2011-01-27 DIAGNOSIS — F1021 Alcohol dependence, in remission: Secondary | ICD-10-CM

## 2011-01-27 DIAGNOSIS — Z86718 Personal history of other venous thrombosis and embolism: Secondary | ICD-10-CM

## 2011-01-27 LAB — IBC PANEL
Iron: 68 ug/dL (ref 42–165)
Transferrin: 239.4 mg/dL (ref 212.0–360.0)

## 2011-01-27 LAB — POCT INR: INR: 1

## 2011-01-27 LAB — CREATININE, SERUM: Creatinine, Ser: 1.1 mg/dL (ref 0.4–1.5)

## 2011-01-27 LAB — HEPATIC FUNCTION PANEL
AST: 27 U/L (ref 0–37)
Bilirubin, Direct: 0.1 mg/dL (ref 0.0–0.3)
Total Bilirubin: 0.6 mg/dL (ref 0.3–1.2)

## 2011-01-27 LAB — CBC WITH DIFFERENTIAL/PLATELET
Basophils Absolute: 0.1 10*3/uL (ref 0.0–0.1)
HCT: 34.5 % — ABNORMAL LOW (ref 39.0–52.0)
Lymphocytes Relative: 20.8 % (ref 12.0–46.0)
Lymphs Abs: 1.9 10*3/uL (ref 0.7–4.0)
Monocytes Relative: 6.7 % (ref 3.0–12.0)
Neutrophils Relative %: 67.4 % (ref 43.0–77.0)
Platelets: 289 10*3/uL (ref 150.0–400.0)
RDW: 24.1 % — ABNORMAL HIGH (ref 11.5–14.6)

## 2011-01-27 LAB — BUN: BUN: 21 mg/dL (ref 6–23)

## 2011-01-27 MED ORDER — SPIRONOLACTONE 25 MG PO TABS: 25.0000 mg | ORAL_TABLET | Freq: Every day | ORAL | Status: AC

## 2011-01-27 NOTE — Patient Instructions (Signed)
Please complete stool cards. 

## 2011-01-27 NOTE — Assessment & Plan Note (Signed)
In context of cellulitis LLE 05/31/ 2011

## 2011-01-27 NOTE — Progress Notes (Signed)
  Subjective:    Patient ID: CRIMSON BEER, male    DOB: [birth year], 77 y.o.   MRN: [id number]  HPI  He is here to follow up on his prior deep venous thrombosis. This occurred in 03/09/2010 in the context of cellulitis the left leg. The venous Doppler done recently did not show significant residual clot, allowing discontinuation of the Coumadin.  He has not had chest pain, palpitations or significant shortness of breath. 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  He denies any pleurisy or hemoptysis.  He does have some residual swelling in the left lower extremity @ the ankle.  There is no family history of deep venous thrombosis or pulmonary embolus.  He also describes black stool on 2 occasions in the last month. He has intermittent mild epigastric pain. I. Severe heartburn or dysphagia. He's had no rectal bleeding.  He has no other bleeding dyscrasias such as nosebleeds, hemoptysis, hematuria, or abnormal bruising or bleeding.  He gives a history of being a recovering alcoholic. He is concerned that the dark stool may be related to liver dysfunction      Review of Systems     Objective:   Physical Exam he appears robust and younger than his stated age.  Chest is clear without rales, rhonchi, or wheezes. He is somewhat barrel chested.  He has a slow S4 with a faint grade 1/2 systolic murmur. Heart sounds are somewhat distant.  Abdomen is well muscled. No organomegaly or masses. There is no hepatojugular reflux. No NVD @ 15 degrees  He does have 1/2+ edema on the right and 1+ on the left. All pulses are intact and no bruits are noted.  He has no scleral icterus or jaundice.  There is no lymphadenopathy of  his head, neck or axilla          Assessment & Plan:  #1 deep venous thrombosis past medical history of. This is most likely related to his prior cellulitis which  caused hypercoagulability  #2 dark stool; rule out melena  #3 past medical history of alcoholism  #4 edema  Plan: Stool cards, CBC and differential, iron panel, hepatic panel, PT, PTT, BUN, creatinine,   Spironolactone  25 mg daily will be prescribed for the edema. He'll be asked to avoid increased salt intake.

## 2011-01-27 NOTE — Progress Notes (Signed)
Addended by: Floydene Flock on: 01/27/2011 02:54 PM   Modules accepted: Orders

## 2011-02-04 ENCOUNTER — Other Ambulatory Visit (INDEPENDENT_AMBULATORY_CARE_PROVIDER_SITE_OTHER): Payer: Medicare Other

## 2011-02-04 DIAGNOSIS — D649 Anemia, unspecified: Secondary | ICD-10-CM

## 2011-02-05 LAB — CBC WITH DIFFERENTIAL/PLATELET
Basophils Absolute: 0.2 10*3/uL — ABNORMAL HIGH (ref 0.0–0.1)
Basophils Relative: 2 % — ABNORMAL HIGH (ref 0–1)
Hemoglobin: 11.9 g/dL — ABNORMAL LOW (ref 13.0–17.0)
MCHC: 32.7 g/dL (ref 30.0–36.0)
Neutro Abs: 6 10*3/uL (ref 1.7–7.7)
Neutrophils Relative %: 62 % (ref 43–77)
RDW: 22.4 % — ABNORMAL HIGH (ref 11.5–15.5)

## 2011-02-07 ENCOUNTER — Telehealth: Payer: Self-pay

## 2011-02-07 DIAGNOSIS — D649 Anemia, unspecified: Secondary | ICD-10-CM

## 2011-02-07 NOTE — Telephone Encounter (Signed)
Order Placed

## 2011-02-07 NOTE — Telephone Encounter (Signed)
Patient dropped off stool cards today and mention he has dark/black stools and a little constipated. Patient had Rene Kocher draw a rainbow while he was here and wonders if Dr.Hopper would want to have any labs checked to futher follow-up on symptoms previously stated. Patient is willing to schedule a follow-up once labs are back if needed.  Dr.Hopper please advise if any labs needed (Blood already drawn)

## 2011-02-07 NOTE — Telephone Encounter (Signed)
Recheck CBC and differential (285.9 )

## 2011-02-08 ENCOUNTER — Other Ambulatory Visit: Payer: Self-pay | Admitting: Internal Medicine

## 2011-02-09 LAB — CBC WITH DIFFERENTIAL/PLATELET
Lymphocytes Relative: 23 % (ref 12–46)
Lymphs Abs: 2.1 10*3/uL (ref 0.7–4.0)
Neutrophils Relative %: 65 % (ref 43–77)
Platelets: 253 10*3/uL (ref 150–400)
RBC: 4.08 MIL/uL — ABNORMAL LOW (ref 4.22–5.81)
WBC: 9.2 10*3/uL (ref 4.0–10.5)

## 2011-02-10 ENCOUNTER — Other Ambulatory Visit (INDEPENDENT_AMBULATORY_CARE_PROVIDER_SITE_OTHER): Payer: Medicare Other

## 2011-02-10 DIAGNOSIS — Z1211 Encounter for screening for malignant neoplasm of colon: Secondary | ICD-10-CM

## 2011-02-10 LAB — HEMOCCULT GUIAC POC 1CARD (OFFICE)
Card #2 Fecal Occult Blod, POC: NEGATIVE
Fecal Occult Blood, POC: NEGATIVE

## 2011-02-22 NOTE — Assessment & Plan Note (Signed)
[facility]                            CARDIOLOGY OFFICE NOTE   NAME:RICHARDSRavinder, Lukehart                   MRN:          [id number]  DATE:03/20/2007                            DOB:          1924/11/19    Mr. Millett is in for followup. In general, he has been stable.  He is  approaching his 82th birthday.  He had 2 drug eluting stents placed in  the left anterior descending artery in 2005.  These were __________  directed, and postdilated.  He has been off of Plavix since 2006 and  only on aspirin.  His aspirin was stopped for polypectomy, then he  developed a GI bleed after this.  He is back on aspirin now.  His  hemoglobin has gone up from the 10 to 12 range according to the patient.  He has been on iron.  He has been really feeling pretty well.  He still  has some shortness of breath and is followed by Dr. Alwyn Ren.  His last  chest x-ray was in 2005.  Cardiac-wise he denies any ischemic chest  pain.   EXAMINATION:  The blood pressure is 128/70, the pulse is 73.  The lung fields are clear with slight prolonged expiration.  The cardiac rhythm is regular.   Overall, the patient is stable.  He does have gallstones.  His cardiac  situation has been stable on a medical regimen that includes only a  single dose  of aspirin, Lopressor at 25 mg daily and a cholesterol-  lowering agent.  He does need to have a lipid and liver profile.  We  will arrange for this to be done.  We will see him back in followup in 1  year or sooner if further problems would arise.     Arturo Morton. Riley Kill, MD, [facility]  Electronically Signed    TDS/MedQ  DD: 03/20/2007  DT: 03/20/2007  Job #: [id number]

## 2011-02-22 NOTE — Letter (Signed)
December 02, 2008    Titus Dubin. Alwyn Ren, MD,FACP,FCCP  [phone] [place], [place] [address]   RE:  QUANDRE, POLINSKI  MRN:  [id number]  /  DOB:  12-Jun-1925   Dear Annette Stable,   I had the pleasure of seeing, Daniel Ali in the office today in a  followup visit.  In general, he continues to swim.  He has mild  shortness of breath.  He does think that Spiriva has helped.  We did do  a chest x-ray here, which I know you had difficulty finding.  It was  read by Stark Jock demonstrating stable with no active disease.  The  mediastinum, hila, pleural, and osseous structures were stable and  unremarkable.  He does think the Spiriva has helped a little bit.  He  does continue to swim.  He has an O2 sat of 96%.  As you know, his  echocardiogram did suggest some mild diastolic relaxation abnormalities  and a calcified aortic valve but the overall ejection fraction was  preserved at 55-60%.   Today on examination, the blood pressure is 132/74, the pulse is 70.  There is not much in the way of expiratory wheezing.  The cardiac rhythm  is regular with a soft S4 gallop.  O2 sat is 96%.   He and I agreed that we would continue to monitor his situation.  I  doubt that this is a cardiac equivalent though I certainly could not  absolutely be certain.  His EKG today is entirely unremarkable.  We will  continue to follow him medically, and I will see him back in followup in  6 weeks.  I appreciate the opportunity of sharing in his care.    Sincerely,      Arturo Morton. Riley Kill, MD, [facility]  Electronically Signed    TDS/MedQ  DD: 12/02/2008  DT: 12/03/2008  Job #: [id number]

## 2011-02-22 NOTE — Assessment & Plan Note (Signed)
[facility]                            CARDIOLOGY OFFICE NOTE   NAME:RICHARDSSylar, Voong                   MRN:          [id number]  DATE:11/22/2007                            DOB:          10/26/1924    Mr. Husain in for follow-up.  For several days, he had diarrhea and  vomiting that lasted about 5-6 days.  He was really feeling quite  poorly.  He is starting to do somewhat better now.  He stopped his  statin secondary some muscle aches.  He remains on isosorbide dinitrate.   His current medications include isosorbide 10 mg t.i.d. omega 3 every  other day, metoprolol tartrate 50 mg 1/2 tablet b.i.d., niacin 500 mg  daily, antioxidant daily, calcium b.i.d. and aspirin one-quarter tablet  daily.   PHYSICAL:  Blood pressure 106/62, pulse is 60.  Lung fields clear.  CARDIAC:  Rhythm is regular.  There is some mild prolonged expiration.   IMPRESSION:  1. Status post percutaneous coronary intervention.  2. History of gallstones followed by Dr. Alwyn Ren.  3. Prior drug-eluting stent now on single dose aspirin.   PLAN:  Return to clinic in 6 months.   ADDENDUM:  EKG reveals normal sinus rhythm is within normal limits.     Arturo Morton. Riley Kill, MD, [facility]  Electronically Signed    TDS/MedQ  DD: 11/28/2007  DT: 11/28/2007  Job #: [id number]

## 2011-02-22 NOTE — Assessment & Plan Note (Signed)
[facility]                            CARDIOLOGY OFFICE NOTE   NAME:RICHARDSBlaine, Hari                   MRN:          [id number]  DATE:10/17/2008                            DOB:          1924/12/02    Mr. Kennan is in for followup.  In general, he is doing reasonably  well.  He is walking 2-3 miles 3-5 times per week.  He does not seem to  have too much trouble with this.  He continues to have some level of  shortness of breath, however, with exercise.  This has been a chronic  issue, which he has had for some time.  He denies any ongoing chest  pain.  The patient's last percutaneous intervention was in 2005, and at  that time, he had placement of a Taxus drug-eluting stent in his left  anterior descending artery.   CURRENT MEDICATIONS:  1. Niacinamide 500 mg daily.  2. Vitamin D 5000 international units daily.  3. Isosorbide dinitrate 10 mg t.i.d.  4. Metoprolol tartrate 50 mg one half b.i.d.  5. Maximum Antioxidant 1 daily.  6. Aspirin 81 mg daily.  7. Omega-3 1000 mg daily.   On physical, he is alert and oriented in no distress.  Blood pressure is  114/70, the pulse is 64.  The jugular veins are not distended.  The lung  fields actually are reasonably clear.  There was not significant  expiratory wheezes.  The cardiac rhythm is regular with a normal first  and second heart sounds, but somewhat distant heart sounds.  A  definitive murmur is not noted.  No extremity edema.   The electrocardiogram demonstrates normal sinus rhythm essentially  within normal limits.   RECOMMENDATIONS:  1. Continue to maintain exercise.  2. A 2-D echocardiogram will be obtained to reassess his overall left      ventricular function.  3. A CBC, BMET, lipid profile will be obtained.  4. Repeat chest x-ray.  5. Return to clinic in 3-6 months.     Arturo Morton. Riley Kill, MD, [facility]  Electronically Signed    TDS/MedQ  DD: 11/18/2008  DT: 11/18/2008  Job #:  [id number]

## 2011-02-25 NOTE — Assessment & Plan Note (Signed)
[facility]                              CARDIOLOGY OFFICE NOTE   NAME:[name], [name]                   MRN:          [id number]  DATE:05/11/2006                            DOB:          05-12-1925    Daniel Ali is in for followup.  Overall he is doing well.  He denies chest  pain.  He has not been taking Plavix at his own choice.  Overall, he feels  well but he does still get short of breath with exertion.  Dr. Alwyn Ren put  him back on Spiriva.  All of his chest x-rays all along have suggested some  mild chronic obstructive lung disease.  His FEF 25/75% is 25% of predicted  normal.  He denies any exertional chest tightness or pain.  He wants to have  a treadmill done.   On exam, the blood pressure is 108/68, pulse 59.  There is slight prolonged  expiration but otherwise lung fields are clear.  Heart sounds are distant  but otherwise unremarkable.  Extremities reveal no edema.   Recent labs reveal an LDL of 57.  PSA 3.23.  HDL of 28.  He has seen Dr.  Alwyn Ren for these.   EKG is sinus bradycardia, otherwise normal.   We plan to have him return in followup.  He wants to have a treadmill done  and pulmonary function studies, which we will arrange.  We will see him back  in followup at that time and review the pulmonary function studies with him  at that time.  He will remain on aspirin and lipid-lowering therapy.  We  will see him in followup 6 months after that.                              Arturo Morton. Riley Kill, MD, [facility]    TDS/MedQ  DD:  05/11/2006  DT:  05/11/2006  Job #:  [id number]

## 2011-02-25 NOTE — Discharge Summary (Signed)
   NAME:  Daniel Ali, Daniel Ali                      ACCOUNT NO.:  [id number]   MEDICAL RECORD NO.:  [id number]                   PATIENT TYPE:  OIB   LOCATION:  6524                                 FACILITY:  [facility]   PHYSICIAN:  Arturo Morton. Riley Kill, M.D.             DATE OF BIRTH:  1925-02-03   DATE OF ADMISSION:  07/30/2003  DATE OF DISCHARGE:  07/31/2003                           DISCHARGE SUMMARY - REFERRING   HISTORY/HOSPITAL COURSE:  Mr. Bohanon is a 77 year old male patient who  initially underwent cardiac catheterization versus substernal chest pain in  November 2004.  He was found to have a mid-eccentric 60% LAD lesion which  did not appear to be unstable.  It was felt the patient could be treated  medically with a beta blocker and nitrates.  The patient was seen again in  the office on July 14, 2003, and he continued to intermittently have some  mild chest discomfort.  Therefore, he was brought in for percutaneous  intervention this area.  Dr. Riley Kill performed a PCI using a Taxus stent to  the mid-LAD lesion without complication.  The patient remained in the  hospital overnight and was ready for discharge to home the following day.  At this point, we will not resume his Imdur or Toprol.  His heart rate has  remained around 58-60.  His blood pressure is 130/60 and over time, we may  need to increase this by adding something such as Altace or something that  does not rate control.  In addition, we will go ahead and stop his Imdur.   DISCHARGE INSTRUCTIONS:  He may resume his vitamins, his Lipitor.  He may  utilize sublingual nitroglycerin if needed for chest pain.  Enteric coated  aspirin 325 mg a day.  Plavix 75 mg a day.  Tylenol 1-2 tablets every 6  hours as needed for pain.  No strenuous activity for two days and gradually  increase activity.  Low-fat diet, clean over catheterization site with soap  and water, call for questions or concerns.   FOLLOW UP:  He is to follow up  with Dr. Rosalyn Charters office on September 07, 2003, at 3 p.m.  At this visit, we do need to look at his blood pressure,  because we may need to go ahead and add another agent, suggest an ACE  inhibitor to bring his blood pressure down without affecting his heart rate.  The patient is enrolled in the STEEPLE trial and they will contact the  patient regarding appointments and any other necessary actions.      Guy Franco, P.A. [facility]                      Thomas D. Riley Kill, M.D.    LB/MEDQ  D:  07/31/2003  T:  07/31/2003  Job:  [id number]   cc:   Titus Dubin. Alwyn Ren, M.D. [facility]

## 2011-02-25 NOTE — H&P (Signed)
Daniel Ali, ELLINGSON NO.:  [id number]   MEDICAL RECORD NO.:  [id number]          PATIENT TYPE:  EMS   LOCATION:  ED                           FACILITY:  [facility]   PHYSICIAN:  Petra Kuba, M.D.    DATE OF BIRTH:  [birth year]   DATE OF ADMISSION:  12/10/2006  DATE OF DISCHARGE:                              HISTORY & PHYSICAL   HISTORY:  The patient is well-known to me from a colonoscopy on the  February 20 which did show left-sided diverticula and a small ascending  polyp which was hot biopsied.  He had held his aspirin for one week but  went back to it and earlier today called me with some bright red blood  per rectum.  He only had minimal symptoms to that juncture and we  decided to put him on clear liquids and not take any more aspirin and  see how he did when he called me later in the day with a little more  weakness and fatigue and a little more bleeding and we elected to see  him in the emergency room, where although labs are pending, he is pale  and we admitted him for observation, further workup, and plans.   PAST MEDICAL HISTORY:  1. His past medical history is pertinent for some abdominal surgeries      and lung surgeries due to trauma.  2. He has a history of coronary artery disease and increased      cholesterol.  3. Remote history of possible alcoholic cirrhosis.  He has been told      and did quit years ago.   FAMILY HISTORY:  His family history is pertinent for his mother with  colon cancer.  He also has a history of colon polyps on previous  colonoscopies.   ALLERGIES:  1. CEFZIL.  2. ECONAZOLE DM.  3. SEPTRA.   CURRENT MEDICATIONS:  Include Nexium, doxycycline, nitroglycerin,  metoprolol, simvastatin, possibly Lipitor, and isosorbide.   REVIEW OF SYSTEMS:  Negative except above.   SOCIAL HISTORY:  Pertinent for quit alcohol, does smoke, does not use  any other over-the-counter medicines.   PHYSICAL EXAMINATION:  No acute distress  although pale.  VITAL SIGNS: Stable.  Afebrile.  HEENT:  Sclerae nonicteric.  LUNGS:  Clear.  HEART: Regular rate and rhythm.  ABDOMEN:  Soft, nontender.  Good peripheral pulses.   LABORATORY DATA:  Labs pending at the time of dictation.   ASSESSMENT:  1. Lower gastrointestinal bleeding, probable post polypectomy versus      diverticula.  2. Coronary artery disease and increased cholesterol.  3. History of multiple traumas with exploratory laps and right chest      surgery due to pneumohemothorax.  4. Questionable history of alcoholic cirrhosis years ago.  5. Family history of colon cancer.  Personal history of colon polyps.   PLAN:  Clear liquids only.  Follow H&H, number of stools and color.  I  have discussed transfusion with him including the risks and he is okay  with that if need be.  Also we may need to repeat a colonoscopy which we  discussed risks, benefits, methods multiple times and will proceed with  that if need be.  Otherwise will continue on nitroglycerin, isosorbide,  and Lopressor as well as pump inhibitors and will check to see why he is  on the doxycycline.           ______________________________  Petra Kuba, M.D.     MEM/MEDQ  D:  12/10/2006  T:  12/11/2006  Job:  [id number]   cc:   Petra Kuba, M.D.  Fax: [phone]

## 2011-02-25 NOTE — Discharge Summary (Signed)
NAME:  Daniel Ali, Daniel Ali                      ACCOUNT NO.:  [id number]   MEDICAL RECORD NO.:  [id number]                   PATIENT TYPE:  INP   LOCATION:  2016                                 FACILITY:  [facility]   PHYSICIAN:  Arturo Morton. Riley Kill, M.D.             DATE OF BIRTH:  1925-08-08   DATE OF ADMISSION:  06/21/2003  DATE OF DISCHARGE:  06/24/2003                           DISCHARGE SUMMARY - REFERRING   DISCHARGE DIAGNOSES:  1. Coronary artery disease.  2. Normocytic anemia.  3. Peripheral vascular disease.  4. Cervical spondylosis.  5. Alcoholism.  6. Diverticulosis.  7. Hemorrhoids.   HOSPITAL COURSE:  [name] is a 77 year old male patient who was  admitted to [facility] on June 21, 2003 with chest pain and  shortness of breath during the day. He had mowed for five hours. At  approximately 8 a.m., he experienced shortness of breath and chest pain  which he described as anterior chest tightness of the chest and upper  abdomen. There was radiation to his throat, but this was accompanied by  nausea, vomiting, or diaphoresis. He had noted recent chest pain and  shortness of breath over the past week. He initially presented to urgent  medical care and then was transferred over to [facility] for further  evaluation.   His EKG revealed normal sinus rhythm, rate 72, with nonspecific ST-T wave  changes in the inferolateral leads. Labs during his hospitalization:  Hemoglobin 12.4, hematocrit 36.7, white 10.2, platelets 219. Potassium 4.0,  BUN 18, creatinine 1.0. Cardiac isoenzymes reached a maximum of 106 with 9.1  MB fraction, troponins negative, total cholesterol 143, triglycerides 62,  HDL 43, LDL 88.   The patient ultimately underwent cardiac catheterization on June 24, 2003 and was found to have a mid excentric 60% LAD lesion which did not  appear to be unstable clot. At this point, it was felt that the patient  would be treated medically with a  beta blocker and nitrates.   DISPOSITION:  He was discharged to home in stable condition as follows:  1. Imdur 30 mg a day  2. Toprol-XL 50 mg a day.  3. Lipitor 20 mg a day.  4. Baby aspirin daily.  5. Sublingual nitroglycerin p.r.n. chest pain.  6. He is to resume his Actonel weekly.  7. Naprosyn if needed.  8. Vitamins.  9. Calcium.  10.      He may utilize Tylenol one to two tablets every six hours as needed     for chest pain.   ACTIVITY:  No strenuous activity for two days and then gradually increase  activity.   DIET:  Remain on a low fat, low cholesterol diet.   WOUND CARE:  Clean over catheter site with soap and water. Call for any  questions or concerns.   FOLLOWUP:  He has a followup appointment on July 14, 2003 at 4 p.m. with  Dr. Bonnee Quin.  Daniel Ali, P.A. [facility]                      Thomas D. Riley Kill, M.D.    Arlana Hove  D:  07/11/2003  T:  07/11/2003  Job:  [id number]

## 2011-02-25 NOTE — Assessment & Plan Note (Signed)
[facility]                            CARDIOLOGY OFFICE NOTE   NAME:RICHARDSBranson, Kranz                   MRN:          [id number]  DATE:09/21/2006                            DOB:          12/30/1924    Mr. Crowson is in for a followup visit. He is doing extremely well.  Overall, he does have a little bit of shortness of breath, but this has  not changed much. He is still seeing Dr. Alwyn Ren. He wonders whether he  has some ulcers acting up, and he wonders whether it could be related to  the fish oil capsules that he has been taking.   PHYSICAL EXAMINATION:  VITAL SIGNS: Weight 183, blood pressure 138/90,  pulse 62.  LUNGS: Fields actually reveal fairly clear lung fiends with slight  prolonged expiration.  CARDIAC: Rhythm is regular with slightly distant heart sounds.  EXTREMITIES: No edema.   EKG reveals normal sinus rhythm within normal limits.   IMPRESSION:  1. Coronary artery disease, status post per cutaneous stenting of the      LAD, including a drug eluting stent.  2. Borderline hypertension.  3. Hypercholesterolemia, on lipid lowering therapy, with a last LDL of      57.   DISPOSITION:  1. Continue current medical regimen.  2. Return to clinic in 6 months.  3. Continue follow up with Dr. Alwyn Ren.  4. Recommend home blood pressure cuff to measure true blood pressures      at home.     Arturo Morton. Riley Kill, MD, [facility]  Electronically Signed    TDS/MedQ  DD: 09/21/2006  DT: 09/22/2006  Job #: [id number]

## 2011-02-25 NOTE — Cardiovascular Report (Signed)
NAME:  Daniel Ali, Daniel Ali                      ACCOUNT NO.:  [id number]   MEDICAL RECORD NO.:  [id number]                   PATIENT TYPE:  OIB   LOCATION:  6526                                 FACILITY:  [facility]   PHYSICIAN:  Arturo Morton. Riley Kill, M.D. [facility]         DATE OF BIRTH:  06-08-1925   DATE OF PROCEDURE:  05/12/2004  DATE OF DISCHARGE:  05/13/2004                              CARDIAC CATHETERIZATION   INDICATIONS:  Mr. Koors is a very pleasant 77 year old gentleman who has  been followed by me for quite some time.  Approximately 10 months ago he  underwent percutaneous angioplasty of the mid left anterior descending  artery.  At that time he had moderate disease in the mid to distal LAD.  He  also underwent stress Cardiolite imaging following this which demonstrated  possible mild apical ischemia.  He has continued to be short of breath.  We  talked above the various options and he was agreeable to going ahead with  cardiac catheterization to reassess his situation.  An echocardiogram had  also suggested some mild right ventricular enlargement.  Importantly, there  was a CT scan which we did which did not reveal evidence of pulmonary  emboli.  An earlier chest x-ray which would suggest increasing atelectasis  did not demonstrate a major problem by CT scan and chest x-ray today prior  to the procedure did not demonstrate a major abnormality.  The plan was to  proceed with cardiac catheterization.   PROCEDURES:  1. Right and left heart catheterization.  2. Selective coronary arteriography.  3. Selective left ventriculography.  4. Intravascular ultrasound.  5. Stenting of the mid and distal left anterior descending artery using     Taxus drug-eluting stents.   DESCRIPTION OF PROCEDURE:  The patient was brought to the catheterization  lab and prepped and draped in the usual fashion.  Through an anterior  puncture, the right femoral artery was easily entered.  A 6 French sheath   was then placed.  There is some tortuosity to be noted.  Following this,  central aorta and left ventricular pressures were measured with pigtail.  Ventriculography was performed in the RAO projection.  We did a hand  injection into the proximal aortic root to exclude aortic regurgitation or  aortic dissection.  There is no evidence of this.  Views of the left and  right coronary arteries were obtained in multiple angiographic projections.  There appeared to be moderate to moderately severe lesion of the mid left  anterior descending artery.  However, did not look dramatically worse on the  previous study.  With this in mind, we elected to go ahead and do a right  heart catheterization to document right heart pressures.  A Swan-Ganz  catheter was taken up into the superior vena cava where saturation was  obtained.  Saturations were also obtained in the right atrium as well as  pulmonary artery and they were unchanged in the  right heart.  The aortic  saturations were also obtained for Fick output.  Pressures were measured  sequentially throughout the right heart.  We could not inject into port for  cardiac output determination because of probable clotting of the tip.  The  right heart catheter was subsequently removed.  I then compared the studies  to the previous studies.  There is perhaps mild progression of disease at  the bifurcation point.  I discussed the various options with the patient. We  proposed continuing the following him along medically, but we also discussed  the possibility that the right heart pressures were normal and there was no  other obvious evidence of significant cause of his shortness of breath.  He  wanted to proceed on with percutaneous intervention given the results of his  prior Cardiolite study.  With this in mind, we elected to cross the LAD  after giving the patient appropriate doses of bivalirudin.  Hi-Torque Floppy  was placed in the distal vessel.   Intravascular ultrasound of the mid and  distal LAD and proximal stent were then performed.  The findings suggested  moderately tight lesions at both locations and a widely patent stent without  evidence of tissue ingrowth.  With this in mind, we again discussed the  options and we elected to proceed.  We then stented the distal lesion with a  12 x 2.75 Taxus drug-eluting stent.  The mid lesion was also done with a 12  x 2.75 drug-eluting stent based on the measurements with intravascular  ultrasound.  The proximal stent was taken up to about 16 atmospheres.  The  distal stent was taken to 14 atmospheres.  Post dilatation was then done  with a 3.25 mm Quantum Maverick balloon.  We more aggressively post dilated  the proximal stent.  There was some pinching, but not loss of side branch  which had been the point of concern prior to the procedure.  All catheters  were subsequently removed and the patient taken to the holding area in  satisfactory clinical condition.  His bivalirudin was stopped.  ACTs were  checked after the initiation of bivalirudin and was satisfactory.   HEMODYNAMIC DATA:  1. Central aorta:  117/61.  2. Left ventricle:  125/12.  3. No gradient on pullback across the aortic valve.  4. Right atrium:  6.  5. Right ventricle:  23/6.  6. PA:  22/10.  7. Pulmonary capillary wedge:  12.  8. Superior vena cava saturation 67%.  9. PA saturation:  67%.  10.      Aortic saturation 93%.  11.      Fick cardiac output:  4.7 liters/minute.  12.      Fick cardiac index:  2.3 liters/minute per sq m.   ANGIOGRAPHIC DATA:  1. Ventriculography was performed in the RAO projection.  Overall systolic     function appeared to be well preserved and ejection fraction is excess of     60%.  2. Hand injection into the aortic root did not suggest aortic dissection nor     was there any evidence of aortic regurgitation.  3. The left main was free of critical disease. 4. The left anterior  descending artery coursed to the apex.  There was     minimal luminal irregularity inside the previously placed stent with no     evidence of significant restenosis.  At the bifurcation point in the mid     vessel at the small diagonal takeoff  there is about a 75% focal stenosis     which is more demonstrable after the wire was placed across.  Following     stenting, this was reduced to less than 10% residual luminal narrowing.     There was second lesion of about 60-70% distally which was stented also     and resulted in a 0% residual stenosis.  The small diagonal branch at the     beginning of the procedure did not have significant narrowing and was     pinched to probably 50-60% at the completion of the procedure, but it is     a small branch and likely to be relieved ultimately with nitroglycerin.  5. The circumflex provides a large marginal branch and a smaller AV     circumflex.  Both of these are without critical narrowing.  6. The right coronary artery is a dominant vessel with posterior descending     and posterior lateral system also without significant narrowing.   CONCLUSIONS:  1. Well-preserved left ventricular function.  2. Normal right heart pressures.  3. Moderate progression of mid and distal left anterior descending disease     with successful percutaneous stenting as described in the above text.   DISPOSITION:  The patient will need to remain on aspirin and Plavix.  He  experiments with his own medications.  I will see him back in the office.  He will continue to follow for his primary care with Dr. Alwyn Ren.                                               Arturo Morton. Riley Kill, M.D. [facility]    TDS/MEDQ  D:  05/12/2004  T:  05/13/2004  Job:  [id number]   cc:   Titus Dubin. Alwyn Ren, M.D. [facility]

## 2011-02-25 NOTE — Cardiovascular Report (Signed)
   NAME:  Daniel Ali, Daniel Ali                      ACCOUNT NO.:  [id number]   MEDICAL RECORD NO.:  [id number]                   PATIENT TYPE:  INP   LOCATION:  2016                                 FACILITY:  [facility]   PHYSICIAN:  Salvadore Farber, M.D.             DATE OF BIRTH:  July 26, 1925   DATE OF PROCEDURE:  06/24/2003  DATE OF DISCHARGE:                              CARDIAC CATHETERIZATION   PROCEDURE:  Left heart catheterization, left ventriculography, coronary  angiography.   INDICATIONS:  Mr. Azam is a 77 year old gentleman without prior history  of coronary disease.  He was admitted on June 21, 2003 after suffering  chest discomfort upon completing one and a half acres of mowing.  In  retrospect, he notes some mild dyspnea, though no chest pain with his daily  walk of 2-1/2 miles in 22 minutes.  He has had no pain at rest.   PROCEDURAL TECHNIQUE:  Informed consent was obtained.  Under 1% lidocaine  local anesthesia a 6-French sheath was placed in the right femoral artery  using the modified Seldinger technique.  Diagnostic angiography and  ventriculography were performed using JL4, JR4, and pigtail catheters.  The  patient tolerated the procedure well and was transferred to the holding room  in stable condition.  Sheaths are to be removed there.   COMPLICATIONS:  None.   FINDINGS:  1. LV 126/11/17.  EF 60% without regional wall motion abnormality.  2. No aortic stenosis or mitral regurgitation.  3. Left main:  Angiographically normal.  4. LAD:  There is an eccentric 60% stenosis of the mid vessel.  This vessel     appears 30-40% in one view and approximately 70% in the most severe view.  5. Circumflex:  Relatively small vessel giving rise to two obtuse marginals.     It is angiographically normal.  6. RCA:  Large, dominant vessel.  It is angiographically normal.   IMPRESSION/RECOMMENDATIONS:  The patient's symptoms are likely due to the  moderate lesion of the  mid left anterior descending.  Since these symptoms  have occurred only with a quite high level of exertion on no medical  therapy, I will plan medical therapy with the addition of nitrates, beta  blockers, and Statins.  His history is not suggestive of an unstable plaque.                                               Salvadore Farber, M.D.    WED/MEDQ  D:  06/24/2003  T:  06/24/2003  Job:  [id number]   cc:   Titus Dubin. Alwyn Ren, M.D. [facility]

## 2011-02-25 NOTE — Assessment & Plan Note (Signed)
[facility]                              CARDIOLOGY OFFICE NOTE   NAME:RICHARDSColvin, Blatt                   MRN:          [id number]  DATE:06/06/2006                            DOB:          Oct 04, 1925    DURATION OF EXERCISE:  7 minutes 32 seconds.   HEART RATE:  122, 86% of age predicted maximum.   COMMENTS:  Mr. Naramore exercised today on the Bruce protocol.  He  experienced absolutely no chest pain.  There was mild shortness of breath.  Electrocardiogram at rest revealed normal sinus rhythm was within normal  limits.  There were no ischemic ST segment changes at maximum exercise.  The  study was thought to be negative with regard to inducible ischemia. He did  have some mild shortness of breath and minor cough.   The patient has had pulmonary function studies which demonstrated an FEF of  25-75%, 40%.  These were thought to be consistent with mild obstructive  airway disease.  He has normal lung volumes and normal diffusion capacity.  The patient thinks that the symptoms may be related in part to his prior job  as a Psychologist, occupational.  Importantly, the patient has had a recent sinusitis, and this  included sore throat and fever for which he received tetracycline or  doxycycline and he thinks he may have had a chest x-ray at the urgent care  center on [address]  The patient is going to try to provide this  information.   The patient has had prior stentings in the left anterior descending artery.  He currently has no ischemic symptoms.  No exercise-induced chest pain or ST  depression.  He has had some shortness of breath and we previously did a  right heart catheterization for this reason and his right heart pressures  were normal.  He has mild pulmonary function abnormalities.  I suggest that  he follow up with Dr. Alwyn Ren. Should her have increasing problems, he is to  call me and we will see him back.  He is going to try to get his x-ray for   [place].                              Arturo Morton. Riley Kill, MD, [facility]    TDS/MedQ  DD:  06/06/2006  DT:  06/07/2006  Job #:  [id number]

## 2011-02-25 NOTE — Op Note (Signed)
   Daniel Ali, Daniel Ali                     ACCOUNT NO.:  [id number]   MEDICAL RECORD NO.:  [id number]                   PATIENT TYPE:  AMB   LOCATION:  XRAY                                 FACILITY:  [facility]   PHYSICIAN:  Ricky D. Gasper Sells, M.D.             DATE OF BIRTH:  1925/09/11   DATE OF PROCEDURE:  06/20/2002  DATE OF DISCHARGE:                                 OPERATIVE REPORT   PREOPERATIVE DIAGNOSES:  Cervical spondylosis with right-sided  radiculopathy, possible C5, probable C6, possible C7.   PROCEDURE:  Video flexion extension dynamic views in the neutral lateral and  right and left oblique views.   PROCEDURE:  With the patient standing in the lateral position, the patient's  cervical spine was x-rayed for hard copy in the neutral, flexion, and  extension positions.  No appreciable movement could be identified on these  studies, perhaps a little bit, maybe at 4-5.  The radiologist will review  the hard copy.   The patient was then turned to the right and the right-sided zygapophyseal  joints were identified.  No joint diathesis was seen.  The same was done on  the left side and a similar finding was found.                                               Ricky D. Gasper Sells, M.D.    RDH/MEDQ  D:  06/20/2002  T:  06/20/2002  Job:  [id number]

## 2011-02-25 NOTE — H&P (Signed)
NAME:  Daniel Ali, Daniel Ali                     ACCOUNT NO.:  [id number]   MEDICAL RECORD NO.:  [id number]                  PATIENT TYPE:  INP   LOCATION:                                       FACILITY:  [facility]   PHYSICIAN:  Arturo Morton. Riley Kill, M.D.             DATE OF BIRTH:  [birth year]   DATE OF ADMISSION:  06/21/2003  DATE OF DISCHARGE:                                HISTORY & PHYSICAL   CHIEF COMPLAINT:  Chest pain.   HISTORY OF PRESENT ILLNESS:  The patient is a 77 year old man who has  recently been in excellent health who presents for chest pain.  He [place]  [place] the [place] and actually mowed for five hours on the day of  presentation. He returned to his home in [place] and had acute onset of  chest pain and shortness of breath.  He states that during the mowing he  fell once on his backside but there were no injuries.  At approximately 7:30  to 8 p.m. this evening, he experienced acute onset of substernal chest pain  in the anterior chest and upper abdomen associated with shortness of breath.  He describes this as tightness and heaviness across the chest and did have  radiation to his throat.  There was no nausea, vomiting, or diaphoresis.  He  also reports recent exertional symptoms for approximately one week when he  goes to the store and walks around and has occasional dizziness upon  exertion as well.   The patient reported this evening to Urgent Care and [facility]  and received oxygen and aspirin.  His chest pain was relieved upon arrival.  He was transferred to [facility] for further evaluation.   PAST MEDICAL HISTORY:  1. Peptic ulcer disease.  2. Surgery for cervical spondylosis in 2003.  3. Colonoscopy January 2003 for diverticulosis and internal hemorrhoids.  4. History of alcoholism.  He quit alcohol in 1982.   ALLERGIES:  SLEEPING PILLS and ANTIHISTAMINES.   CURRENT MEDICATIONS:  1. Actonel every week.  2.  Naprosyn p.r.n.  3. Aspirin 162 mg q.d.  4. Vitamins q.d.  5. Calcium q.d.   SOCIAL HISTORY:  He is a retired Art gallery manager for [employer].  He lives in [place].  He occasionally smokes a cigar and has not used alcohol since 1982.  He is  very active.  He has a positive family history of colon cancer.   REVIEW OF SYMPTOMS:  CONSTITUTIONAL:  Negative.  HEENT:  Occasional sore  throat.  SKIN:  Negative.  CARDIOPULMONARY:  Positive chest pain.  Positive  shortness of breath.  Positive dyspnea on exertion.  Positive presyncope.  GENITOURINARY:  Negative.  MUSCULOSKELETAL:  Negative.  GASTROINTESTINAL:  Dyspepsia, GERD, and occasional abdominal pain.  All other review of systems  is negative.   PHYSICAL EXAMINATION:  VITAL SIGNS:  The patient's blood pressure on  physical exam is 130/76, respirations  20, pulse 80.  Temperature is  afebrile.  GENERAL:  He is a well-developed, well-nourished man in no apparent  distress.  HEENT:  Within normal limits.  Oropharynx and oral cavity are clear.  NECK:  Supple.  No JVD or bruits. There are 2+ carotid pulses.  No  lymphadenopathy.  CARDIOVASCULAR:  Regular rate and rhythm.  No murmurs, rubs, or gallops.  LUNGS:  Clear to auscultation bilaterally.  No wheezes, rales, or rhonchi.  SKIN:  Within normal limits.  ABDOMEN:  Soft, nontender, and nondistended.  No hepatosplenomegaly.  EXTREMITIES:  No clubbing, cyanosis, or edema.  There is 3+ peripheral  pulses.  NEUROLOGICAL:  Intact.   LABORATORY DATA:  Chest x-ray is pending.  Labs are pending.  EKG shows  normal sinus rhythm with a rate of 73.  QRS is 72 msec, PR interval 172.  No  Q-waves and no ischemic changes.   ASSESSMENT/PLAN:  The patient is a 77 year old man with coronary artery  disease risk factors of age and sex who presents for history of unstable  angina with typical chest pain.   PLAN:  1. Unstable angina:  This patient has an intermittent pretest probability     for coronary artery  disease.  He does not have electrocardiogram changes     and is currently chest pain-free and doing well.  I have discussed with     him the options of preceding with either catheterization or noninvasive     stress test.  We will perhaps proceed with these studies on Monday.  I     would recommend cardiac catheterization but depending on his lab results     we may decide on an exercise Cardiolite.  He certainly is quite active     and has the ability to exercise. Therefore, I will anticoagulate the     patient and rule him out for myocardial infarction.  I will start a very     low dose beta blocker and give him an aspirin.  His anticoagulation will     be with Lovenox subcutaneously q.12h. and we will follow him for chest     pain.  I will follow his telemetry closely and proceed with risk     stratification on Monday.  2. Peptic ulcer disease/gastroesophageal reflux disease:  I will give him     Protonix and follow his symptoms.  3. Continued multivitamin.      Heloise Beecham, M.D. [facility]                Thomas D. Riley Kill, M.D.    DWM/MEDQ  D:  06/21/2003  T:  06/22/2003  Job:  [id number]

## 2011-02-25 NOTE — Procedures (Signed)
[place]  Patient:    Daniel Ali, Daniel Ali Visit Number: [id number] MRN: [id number]          Service Type: END Location: ENDO Attending Physician:  Nelda Marseille Dictated by:   Petra Kuba, M.D. Proc. Date: 10/24/01 Admit Date:  10/24/2001   CC:         Titus Dubin. Alwyn Ren, M.D. [facility]   Procedure Report  PROCEDURE:  Colonoscopy.  INDICATION:  A family history of colon cancer, personal history of colon polyps due for repeat screening.  Consent was signed after risks, benefits, methods, and options were thoroughly discussed in the office.  MEDICATIONS:  Demerol 60, Versed 6.  PROCEDURE:  Rectal inspection was pertinent for external hemorrhoids, small. Digital examination was negative.  Pediatric video adjustable scope was inserted and easily advanced around the colon to the cecum.  This did not require any abdominal pressure or any position changes.  The cecum was identified by the appendiceal orifice and the ileocecal valve.  In fact, the scope was inserted for a quick look in the TI end, which was normal.  The scope was slowly withdrawn.  The prep was adequate.  There was some liquid stool that required washing and suctioning.  On slow withdrawal through the colon, there were some scattered left-sided diverticuli, including the distal transverse.  No other abnormalities were seen, specifically no polyps, tumors, masses, etc.  Once back in the rectum, the scope was retroflexed, pertinent for some internal hemorrhoids, tiny.  The scope was straightened, air was suctioned, scope removed.  The patient tolerated the procedure well.  There was no obvious immediate complication.  ENDOSCOPIC DIAGNOSES: 1. External greater than internal hemorrhoids. 2. Left scattered occasional diverticuli to distal transverse. 3. Otherwise within normal limits to the cecum and a quick look in the    terminal ileum.  PLAN:  GI follow-up p.r.n., return care  to Dr. Alwyn Ren for the customary health sure no further workup plans yearly rectals and guaiacs, and otherwise if doing well, repeat colon screening in five years unless needed sooner p.r.n. Dictated by:   Petra Kuba, M.D. Attending Physician:  Nelda Marseille DD:  10/24/01 TD:  10/24/01 Job: [id number]

## 2011-03-23 ENCOUNTER — Other Ambulatory Visit: Payer: Self-pay | Admitting: Cardiovascular Disease

## 2011-03-23 ENCOUNTER — Other Ambulatory Visit: Payer: Self-pay | Admitting: Cardiology

## 2011-03-28 ENCOUNTER — Other Ambulatory Visit: Payer: Self-pay | Admitting: Cardiology

## 2011-04-13 ENCOUNTER — Inpatient Hospital Stay (INDEPENDENT_AMBULATORY_CARE_PROVIDER_SITE_OTHER)
Admission: RE | Admit: 2011-04-13 | Discharge: 2011-04-13 | Disposition: A | Discharge status: Home / Self Care | Payer: Medicare Other | Source: Ambulatory Visit | Attending: Family Medicine | Admitting: Family Medicine

## 2011-04-13 ENCOUNTER — Ambulatory Visit (INDEPENDENT_AMBULATORY_CARE_PROVIDER_SITE_OTHER): Payer: Medicare Other

## 2011-04-13 DIAGNOSIS — J069 Acute upper respiratory infection, unspecified: Secondary | ICD-10-CM

## 2011-04-28 ENCOUNTER — Other Ambulatory Visit: Payer: Self-pay | Admitting: Cardiovascular Disease

## 2011-05-28 ENCOUNTER — Other Ambulatory Visit: Payer: Self-pay | Admitting: Cardiovascular Disease

## 2011-06-06 ENCOUNTER — Encounter: Payer: Self-pay | Admitting: Cardiology

## 2011-06-07 ENCOUNTER — Ambulatory Visit (INDEPENDENT_AMBULATORY_CARE_PROVIDER_SITE_OTHER): Payer: Medicare Other | Admitting: Cardiology

## 2011-06-07 ENCOUNTER — Other Ambulatory Visit: Payer: Self-pay | Admitting: Cardiovascular Disease

## 2011-06-07 ENCOUNTER — Encounter: Payer: Self-pay | Admitting: Cardiology

## 2011-06-07 DIAGNOSIS — I2510 Atherosclerotic heart disease of native coronary artery without angina pectoris: Secondary | ICD-10-CM

## 2011-06-07 DIAGNOSIS — E785 Hyperlipidemia, unspecified: Secondary | ICD-10-CM

## 2011-06-07 DIAGNOSIS — I10 Essential (primary) hypertension: Secondary | ICD-10-CM

## 2011-06-07 DIAGNOSIS — I82409 Acute embolism and thrombosis of unspecified deep veins of unspecified lower extremity: Secondary | ICD-10-CM

## 2011-06-07 DIAGNOSIS — E7800 Pure hypercholesterolemia, unspecified: Secondary | ICD-10-CM

## 2011-06-07 NOTE — Patient Instructions (Addendum)
Your physician wants you to follow-up in: one year.   You will receive a reminder letter in the mail two months in advance. If you don't receive a letter, please call our office to schedule the follow-up appointment.  Your physician recommends that you return for lab work in: 2-3 weeks (Lipid profile and Liver profile) (fasting, nothing to eat or drink after midnight)

## 2011-06-09 NOTE — Progress Notes (Signed)
HPI:  He actually is doing quite well.  No major complaints at this point in time.  He was admitted about a year ago with a peroneal DVT, and then had some problems with the anticoagulation, and was taken off.  He had had some cellulitis which Dr. Alwyn Ren thought was the etiology, and then this was resolved.  He remains functional.  He is not having chest pain.  He remains on ASA.   Current Outpatient Prescriptions  Medication Sig Dispense Refill  . aspirin 81 MG tablet Take 81 mg by mouth daily.        . Cholecalciferol (VITAMIN D-3) 5000 UNITS TABS Take by mouth daily.        . clotrimazole-betamethasone (LOTRISONE) lotion Apply topically 2 (two) times daily.        . Coenzyme Q10 (COQ10) 100 MG CAPS Take by mouth daily.        Marland Kitchen guaifenesin (MUCUS RELIEF) 400 MG TABS Take 400 mg by mouth as needed.        . isosorbide dinitrate (ISORDIL) 10 MG tablet take 1 tablet by mouth three times a day  90 tablet  2  . metoprolol succinate (TOPROL-XL) 25 MG 24 hr tablet take 1 tablet by mouth twice a day  60 tablet  0  . Multiple Vitamins-Minerals (ANTIOXIDANT) TABS Take by mouth daily.        . niacin (NIASPAN) 500 MG CR tablet Take 500 mg by mouth at bedtime.        . nitroGLYCERIN (NITROSTAT) 0.4 MG SL tablet Place 0.4 mg under the tongue every 5 (five) minutes as needed.        . NON FORMULARY Pure Aloe Juice, 20z once daily       . Selenium 200 MCG CAPS Take by mouth daily.        . Senna 187 MG TABS Take by mouth daily. 2 po once daily prn         Allergies  Allergen Reactions  . Cefprozil     REACTION: insomnia  . Simvastatin     REACTION: MUSCLE ACHES    Past Medical History  Diagnosis Date  . Loss of height   . Compression fracture   . Hypertension   . Hypercholesterolemia   . Rhinitis   . Dyspnea   . Weight loss   . CAD (coronary artery disease)   . Peptic ulcer disease   . Alcohol abuse   . AMI (acute mesenteric ischemia)   . Colon polyps   . Pleurisy with effusion      Past Surgical History  Procedure Date  . Exploratory laparotomy 1978    due to kidney problems  . Thoracotomy 1982    hemothorax  . Back surgery 2003    Cervical Spondylosis  . Spine surgery 2003    C-spine  . Cardiac catheterization 06/21/03  . Coronary stent placement 07/2003    x3  . Coronary angioplasty with stent placement 05/2004  . Colonoscopy w/ polypectomy 11/2006    Family History  Problem Relation Age of Onset  . Diabetes Father   . Colon cancer Mother     History   Social History  . Marital Status: Divorced    Spouse Name: N/A    Number of Children: N/A  . Years of Education: N/A   Occupational History  . Not on file.   Social History Main Topics  . Smoking status: Never Smoker   . Smokeless tobacco: Not on file  .  Alcohol Use: No  . Drug Use: No  . Sexually Active: Not on file   Other Topics Concern  . Not on file   Social History Narrative  . No narrative on file    ROS: Please see the HPI.  All other systems reviewed and negative.  PHYSICAL EXAM:  BP 118/70  Pulse 67  Resp 14  Ht 5\' 7"  (1.702 m)  Wt 176 lb (79.833 kg)  BMI 27.57 kg/m2  General: Well developed, well nourished, in no acute distress. Head:  Normocephalic and atraumatic. Neck: no JVD Lungs: Clear to auscultation and percussion. Heart: Normal S1 and S2.  No murmur, rubs or gallops.  Abdomen:  Normal bowel sounds; soft; non tender; no organomegaly Pulses: Pulses normal in all 4 extremities. Extremities: No clubbing or cyanosis. No edema. Neurologic: Alert and oriented x 3.  EKG: NSR.  WNL.  No acute changes.   ASSESSMENT AND PLAN:

## 2011-06-12 ENCOUNTER — Encounter: Payer: Self-pay | Admitting: Cardiology

## 2011-06-12 NOTE — Assessment & Plan Note (Signed)
Was on warfarin, and developed some bleeding.  Felt to be precipitated by cellulitis.  Now off of warfarin.  No history of hypercoagulable state.

## 2011-06-12 NOTE — Assessment & Plan Note (Signed)
Controlled at the present time.  

## 2011-06-12 NOTE — Assessment & Plan Note (Signed)
Probably should have lipid and liver, but will defer to Dr. Alwyn Ren.

## 2011-06-12 NOTE — Assessment & Plan Note (Signed)
No current problems or symptoms at the present time.  Takes ASA alone, and no problems whatsoever.  Continue medical therapy.

## 2011-06-15 ENCOUNTER — Ambulatory Visit: Payer: Medicare Other | Admitting: Cardiology

## 2011-06-26 ENCOUNTER — Other Ambulatory Visit: Payer: Self-pay | Admitting: Cardiology

## 2011-06-27 ENCOUNTER — Ambulatory Visit (INDEPENDENT_AMBULATORY_CARE_PROVIDER_SITE_OTHER): Payer: Medicare Other | Admitting: *Deleted

## 2011-06-27 DIAGNOSIS — E7800 Pure hypercholesterolemia, unspecified: Secondary | ICD-10-CM

## 2011-06-27 LAB — LIPID PANEL
Cholesterol: 143 mg/dL (ref 0–200)
LDL Cholesterol: 86 mg/dL (ref 0–99)
Total CHOL/HDL Ratio: 3

## 2011-06-27 LAB — HEPATIC FUNCTION PANEL
Alkaline Phosphatase: 79 U/L (ref 39–117)
Bilirubin, Direct: 0 mg/dL (ref 0.0–0.3)
Total Bilirubin: 0.4 mg/dL (ref 0.3–1.2)
Total Protein: 6.1 g/dL (ref 6.0–8.3)

## 2011-07-06 ENCOUNTER — Other Ambulatory Visit: Payer: Self-pay | Admitting: Cardiology

## 2011-07-27 ENCOUNTER — Other Ambulatory Visit: Payer: Self-pay | Admitting: Cardiology

## 2011-08-27 ENCOUNTER — Other Ambulatory Visit: Payer: Self-pay | Admitting: Cardiology

## 2011-09-24 ENCOUNTER — Other Ambulatory Visit: Payer: Self-pay | Admitting: Cardiology

## 2011-09-26 ENCOUNTER — Other Ambulatory Visit: Payer: Self-pay

## 2011-09-26 MED ORDER — ISOSORBIDE DINITRATE 10 MG PO TABS: 10.0000 mg | ORAL_TABLET | Freq: Three times a day (TID) | ORAL | Status: DC

## 2011-12-26 ENCOUNTER — Telehealth: Payer: Self-pay | Admitting: Cardiology

## 2011-12-26 NOTE — Telephone Encounter (Signed)
Pt was told be Dr Riley Kill to exercise and his insurance has cxl his exercise program but will reinstate it with a call from [place] , stating he needs to exercise for his heart health, [facility] [phone],

## 2011-12-26 NOTE — Telephone Encounter (Signed)
The number for pt is not a working number. I called [employer] and this is the main number for company.  I am unsure what type of notification the pt got from his insurance about cancelling his exercise program.  If the pt needs a letter then we can supply this but it is not likely that we will be able to get coverage just by calling [employer].

## 2011-12-28 ENCOUNTER — Telehealth: Payer: Self-pay | Admitting: *Deleted

## 2011-12-28 NOTE — Telephone Encounter (Signed)
Pt called with some concern about insurance coverage. Tried to call Pt back none of the numbers on file are working number called and left message on Pt EC to have Pt return call and to update his contact info.

## 2011-12-29 ENCOUNTER — Encounter: Payer: Self-pay | Admitting: Internal Medicine

## 2011-12-29 ENCOUNTER — Ambulatory Visit (INDEPENDENT_AMBULATORY_CARE_PROVIDER_SITE_OTHER): Payer: 59 | Admitting: Internal Medicine

## 2011-12-29 VITALS — BP 118/62 | HR 54 | Temp 98.2°F | Ht 66.5 in | Wt 176.0 lb

## 2011-12-29 DIAGNOSIS — F1010 Alcohol abuse, uncomplicated: Secondary | ICD-10-CM

## 2011-12-29 DIAGNOSIS — I2510 Atherosclerotic heart disease of native coronary artery without angina pectoris: Secondary | ICD-10-CM

## 2011-12-29 DIAGNOSIS — I10 Essential (primary) hypertension: Secondary | ICD-10-CM

## 2011-12-29 NOTE — Assessment & Plan Note (Signed)
He is asymptomatic despite a high level of cardiovascular exercise; medically it would be preventive therapy for him to continue the exercise program. He should contact his insurance company with these recommendations.

## 2011-12-29 NOTE — Progress Notes (Signed)
  Subjective:    Patient ID: Daniel Ali, male    DOB: 22-Jun-1925, 78 y.o.   MRN: [id number]  HPI The preventive health program "Silver Sneakers" through [facility] was canceled in January 2013. He was told his recourse was to contact his medical physician.  [facility] With exercise physically and emotionally he felt much better.  He specifically denies chest pain, palpitations, dyspnea, or claudication with cardiovascular exercise.  He had 3 coronary artery stents placed in 2004 and one in 2005. It  was to prevent disease progression that he has engaged in exercise program.  He has had a 10 pound weight loss over the last year with his exercise program    Review of Systems  He was recently hospitalized following cellulitis; he was on anticoagulation for 3 months     Objective:   Physical Exam  He appears healthy and well-nourished; he is in no acute distress.Appears younger than stated age   No carotid bruits are present.  Heart rhythm regular; rate slow with no significant murmurs or gallops.  Chest is clear with no increased work of breathing  There is no evidence of aortic aneurysm or renal artery bruits  He has no clubbing or cyanosis; trace - 1/2+ ankle edema    Pedal pulses are intact   No ischemic skin changes are present         Assessment & Plan:

## 2011-12-29 NOTE — Assessment & Plan Note (Signed)
He remains a nondrinker

## 2011-12-29 NOTE — Assessment & Plan Note (Signed)
Blood pressure control is excellent; no change indicated 

## 2011-12-29 NOTE — Patient Instructions (Signed)
Preventive Health Care: Exercise at least 30-45 minutes a day,  3-4 days a week.  Eat a low-fat diet with lots of fruits and vegetables, up to 7-9 servings per day. Consume less than 40 grams of sugar per day from foods & drinks with High Fructose Corn Sugar as # 1,2,3 or # 4 on label. Health Care Power of Attorney & Living Will. Complete if not in place ; these place you in charge of your health care decisions. Blood Pressure Goal  Ideally is an AVERAGE < 135/85. This AVERAGE should be calculated from @ least 5-7 BP readings taken @ different times of day on different days of week. You should not respond to isolated BP readings , but rather the AVERAGE for that week  

## 2011-12-30 NOTE — Telephone Encounter (Signed)
Home and Mobile number listed in chart are disconnected.  Will await return phone call from pt about this issue.

## 2012-01-11 ENCOUNTER — Encounter: Payer: Self-pay | Admitting: Internal Medicine

## 2012-01-11 ENCOUNTER — Ambulatory Visit (INDEPENDENT_AMBULATORY_CARE_PROVIDER_SITE_OTHER): Payer: 59 | Admitting: Internal Medicine

## 2012-01-11 VITALS — BP 124/66 | HR 79 | Temp 98.1°F | Wt 174.0 lb

## 2012-01-11 DIAGNOSIS — J069 Acute upper respiratory infection, unspecified: Secondary | ICD-10-CM

## 2012-01-11 DIAGNOSIS — J209 Acute bronchitis, unspecified: Secondary | ICD-10-CM

## 2012-01-11 MED ORDER — AMOXICILLIN 500 MG PO CAPS: 500.0000 mg | ORAL_CAPSULE | Freq: Three times a day (TID) | ORAL | Status: AC

## 2012-01-11 MED ORDER — HYDROCODONE-HOMATROPINE 5-1.5 MG/5ML PO SYRP: 5.0000 mL | ORAL_SOLUTION | Freq: Four times a day (QID) | ORAL | Status: AC | PRN

## 2012-01-11 NOTE — Patient Instructions (Signed)
Nasal cleansing in the shower as discussed. Make sure that all residual soap is removed to prevent irritation.

## 2012-01-11 NOTE — Progress Notes (Signed)
  Subjective:    Patient ID: Daniel Ali, male    DOB: 1925/03/11, 78 y.o.   MRN: [id number]  HPI Respiratory tract infection Onset/symptoms:< 2 weeks ago as rhinitis Exposures (illness/environmental/extrinsic):son ill(in Rehab center) Progression of symptoms:to head congestion , cough Treatments/response:OTC cough syrup with decongestant Present symptoms: Fever/chills/sweats:no Frontal headache:yes Facial pain:no Nasal purulence:yellow Sore throat:no Dental pain:no Lymphadenopathy:no Wheezing/shortness of breath:yes Cough/sputum/hemoptysis:yellow; = volume from head & chest Pleuritic pain:no Associated extrinsic/allergic symptoms:itchy eyes/ sneezing:both chronically Past medical history: Seasonal allergies/asthma:no Smoking history:never           Review of Systems His Silver Sneakers participation has been rescinded; [employer] is requesting some process of denial     Objective:   Physical Exam General appearance:good health ;well nourished; no acute distress or increased work of breathing is present.  No  lymphadenopathy about the head, neck, or axilla noted.   Eyes: No conjunctival inflammation or lid edema is present.   Ears:  External ear exam shows no significant lesions or deformities.  Otoscopic examination reveals clear canals, tympanic membranes are intact bilaterally without bulging, retraction, inflammation or discharge.  Nose:  External nasal examination shows no deformity or inflammation. Nasal mucosa are dry without lesions or exudates. No septal dislocation or deviation.No obstruction to airflow.   Oral exam: Dental hygiene is good; lips and gums are healthy appearing.There is no oropharyngeal erythema or exudate noted. Slightly hoarse     Heart:  Normal rate and regular rhythm. S1 and S2 normal without gallop, murmur, click, rub S 4 Lungs:Chest clear to auscultation; no wheezes, rhonchi,rales ,or rubs present.No increased work of  breathing.  Dry cough  Extremities:  No cyanosis, edema; minor clubbing  noted . Trace edema   Skin: Warm & dry .          Assessment & Plan:  #1 acute bronchitis w/o bronchospasm #2 URI  #3 regular exercise program is medically indicated as preventive medicine; I'm asking him to research for what reason this has been declined Plan: See orders and recommendations

## 2012-01-26 ENCOUNTER — Other Ambulatory Visit: Payer: Self-pay | Admitting: Cardiology

## 2012-01-26 NOTE — Telephone Encounter (Signed)
Refilled isosorbide  

## 2012-02-23 ENCOUNTER — Other Ambulatory Visit: Payer: Self-pay | Admitting: Cardiology

## 2012-03-02 ENCOUNTER — Telehealth: Payer: Self-pay | Admitting: Cardiology

## 2012-03-02 MED ORDER — ISOSORBIDE DINITRATE 10 MG PO TABS: 10.0000 mg | ORAL_TABLET | Freq: Three times a day (TID) | ORAL | Status: DC

## 2012-03-02 MED ORDER — METOPROLOL SUCCINATE ER 25 MG PO TB24: 25.0000 mg | ORAL_TABLET | Freq: Every day | ORAL | Status: DC

## 2012-03-02 NOTE — Telephone Encounter (Signed)
Isosorbide and metoprolol refill needed , pt was told he needed colonoscopy , having problems with IRS and Identity theft, so has a lot going on to have that done right now, can he get refill for now and schedule later? Only has four left and needs three a day, so will be out tomorrow, uses [facility] [address] , pls call [phone]

## 2012-03-02 NOTE — Telephone Encounter (Signed)
Patient called stated he needed refills on isosorbide and metoprolol called in to [facility] on [address].Prescriptions sent to [facility] [address].

## 2012-03-02 NOTE — Telephone Encounter (Signed)
F/u   Patient calling for f/u on this matter, he can be reached at [phone]

## 2012-04-04 ENCOUNTER — Ambulatory Visit: Payer: Medicare Other | Admitting: Cardiology

## 2012-04-24 ENCOUNTER — Encounter: Payer: Self-pay | Admitting: Nurse Practitioner

## 2012-04-24 ENCOUNTER — Ambulatory Visit (INDEPENDENT_AMBULATORY_CARE_PROVIDER_SITE_OTHER): Payer: Medicare Other | Admitting: Nurse Practitioner

## 2012-04-24 VITALS — BP 118/58 | HR 60 | Ht 67.0 in | Wt 179.2 lb

## 2012-04-24 DIAGNOSIS — I2510 Atherosclerotic heart disease of native coronary artery without angina pectoris: Secondary | ICD-10-CM

## 2012-04-24 NOTE — Assessment & Plan Note (Addendum)
He is not having any active cardiac symptoms. I do think he has a broken heart and is just grieving. This friend was apparently like a wife to him. We spent a long time talking. I explained that this next year can be hard on his health and that he needs to keep up nutrition, sleep and activity. Will see him back in September as scheduled per Dr. Riley Kill. He will also see Dr. Quintella Reichert as needed. Patient is agreeable to this plan and will call if any problems develop in the interim.

## 2012-04-24 NOTE — Patient Instructions (Signed)
Stay on your current medicines  Try to get out of the house and do a little activity each day.  Try to maintain your nutrition and get adequate sleep  We will see you in 2 months.  Call the [facility] office at [phone] if you have any questions, problems or concerns.

## 2012-04-24 NOTE — Progress Notes (Signed)
Daniel Ali Date of Birth: 01/04/1925 Medical Record #[id number]  History of Present Illness: Daniel Ali is seen today for a work in visit. He is seen for Dr. Riley Kill. He has CAD with remote stenting. Other issues include DVT, cellulitis, HTN, HLD, PUD, alcohol abuse, mesenteric ischemia, colonic polyps.   He comes in today. He is here alone. The reason for his visit is not quite clear. He denies chest pain. Not short of breath. Has some ring worm on his right lower leg. Is using some type of antifungal cream with good results. Has chronic edema. No current redness or warmth noted. No fever or chills. His vision is declining and he may need some cataract surgery in the future. His best friend of over 39 years died of liver cancer 3 weeks ago. He is just heart broken. He wants to know what he should do. He is not getting out. Appetite is ok. He is not getting out much. He does not seem to be having any active cardiac complaints.   Current Outpatient Prescriptions on File Prior to Visit  Medication Sig Dispense Refill  . aspirin 81 MG tablet Take 81 mg by mouth daily.        . Cholecalciferol (VITAMIN D-3) 5000 UNITS TABS Take by mouth daily.        . clotrimazole-betamethasone (LOTRISONE) lotion Apply topically 2 (two) times daily.        . Coenzyme Q10 (COQ10) 100 MG CAPS Take by mouth daily.        Marland Kitchen guaifenesin (MUCUS RELIEF) 400 MG TABS Take 400 mg by mouth as needed.        . isosorbide dinitrate (ISORDIL) 10 MG tablet Take 1 tablet (10 mg total) by mouth 3 (three) times daily.  270 tablet  3  . metoprolol succinate (TOPROL-XL) 25 MG 24 hr tablet Take 1 tablet (25 mg total) by mouth daily.  90 tablet  3  . Multiple Vitamins-Minerals (ANTIOXIDANT) TABS Take by mouth daily.        . nitroGLYCERIN (NITROSTAT) 0.4 MG SL tablet Place 0.4 mg under the tongue every 5 (five) minutes as needed.        . Senna 187 MG TABS Take by mouth daily. 2 po once daily prn       . Selenium 200 MCG  CAPS Take by mouth daily.        Marland Kitchen DISCONTD: isosorbide dinitrate (ISORDIL) 10 MG tablet take 1 tablet by mouth three times a day  90 tablet  0    Allergies  Allergen Reactions  . Cefprozil     REACTION: insomnia  . Simvastatin     REACTION: MUSCLE ACHES    Past Medical History  Diagnosis Date  . Loss of height   . Compression fracture   . Hypertension   . Hypercholesterolemia   . Rhinitis   . Dyspnea   . Weight loss   . CAD (coronary artery disease)   . Peptic ulcer disease   . Alcohol abuse   . AMI (acute mesenteric ischemia)   . Colon polyps   . Pleurisy with effusion     Past Surgical History  Procedure Date  . Exploratory laparotomy 1978    due to kidney problems  . Thoracotomy 1982    hemothorax  . Back surgery 2003    Cervical Spondylosis  . Spine surgery 2003    C-spine  . Cardiac catheterization 06/21/03  . Coronary stent placement 07/2003  x3  . Coronary angioplasty with stent placement 05/2004  . Colonoscopy w/ polypectomy 11/2006    History  Smoking status  . Never Smoker   Smokeless tobacco  . Not on file    History  Alcohol Use No    Family History  Problem Relation Age of Onset  . Diabetes Father   . Colon cancer Mother     Review of Systems: The review of systems is per the HPI.  All other systems were reviewed and are negative.  Physical Exam: BP 118/58  Pulse 60  Ht 5\' 7"  (1.702 m)  Wt 179 lb 3.2 oz (81.285 kg)  BMI 28.07 kg/m2 Patient is quite tearful but in no acute distress. Skin is warm and dry. Color is normal.  HEENT is unremarkable. Normocephalic/atraumatic. PERRL. Sclera are nonicteric. Neck is supple. No masses. No JVD. Lungs are clear. Cardiac exam shows a regular rate and rhythm. Abdomen is soft. Extremities are with edema. Round quarter size red rash noted on the right lower leg. Looks like ringworm. No obvious cellulitis noted. Gait and ROM are intact. No gross neurologic deficits noted.   LABORATORY DATA: EKG  today shows sinus and is normal.    Assessment / Plan:

## 2012-04-27 ENCOUNTER — Ambulatory Visit: Payer: Medicare Other | Admitting: Cardiology

## 2012-05-16 ENCOUNTER — Encounter (INDEPENDENT_AMBULATORY_CARE_PROVIDER_SITE_OTHER): Payer: PRIVATE HEALTH INSURANCE | Admitting: Ophthalmology

## 2012-05-16 DIAGNOSIS — H2510 Age-related nuclear cataract, unspecified eye: Secondary | ICD-10-CM

## 2012-05-16 DIAGNOSIS — H33309 Unspecified retinal break, unspecified eye: Secondary | ICD-10-CM

## 2012-05-16 DIAGNOSIS — H43819 Vitreous degeneration, unspecified eye: Secondary | ICD-10-CM

## 2012-05-16 DIAGNOSIS — H33009 Unspecified retinal detachment with retinal break, unspecified eye: Secondary | ICD-10-CM

## 2012-05-16 DIAGNOSIS — H3530 Unspecified macular degeneration: Secondary | ICD-10-CM

## 2012-05-28 ENCOUNTER — Ambulatory Visit (INDEPENDENT_AMBULATORY_CARE_PROVIDER_SITE_OTHER): Payer: Medicare Other | Admitting: Ophthalmology

## 2012-05-29 ENCOUNTER — Ambulatory Visit (INDEPENDENT_AMBULATORY_CARE_PROVIDER_SITE_OTHER): Payer: Medicare Other | Admitting: Ophthalmology

## 2012-05-29 DIAGNOSIS — H33309 Unspecified retinal break, unspecified eye: Secondary | ICD-10-CM

## 2012-06-01 ENCOUNTER — Telehealth: Payer: Self-pay | Admitting: Internal Medicine

## 2012-06-01 NOTE — Telephone Encounter (Signed)
Daniel Ali from [facility] called requesting that we call them back regarding the silver sneakers exercise program. The patient has contacted the ins co to get this covered, but they need to know that it's medically necessary. Ph # [phone]

## 2012-06-04 NOTE — Telephone Encounter (Signed)
See highlighted notes in OV

## 2012-06-04 NOTE — Telephone Encounter (Signed)
Dr.Hopper please advise 

## 2012-06-08 NOTE — Telephone Encounter (Addendum)
Tried to contact rep back several time with no success, spend over 1 hour on the phone being transfer to several department that were still unable to help me to determine where info needed to be sent. Advise Pt of my attempt to contact someone in reference to this matter. Pt informed to contact his insurance to see whom we need to contact in order to provide this documentation to. Pt will call back with information so that he can get program approved.    Pecola Lawless, MD       Sent: Wynelle Link June 03, 2012  4:13 PM    To: Verdene Rio, CMA     Whitman Hero     Message     Please see 12/29/11 OV with statement concerning Silver Sneakers . This would be an extension of exercise after Cardiac Rehab as prevention of CAD progression.The medical literature supports such an exercise program as the most important intervention to prevent recurrent MI.

## 2012-06-15 ENCOUNTER — Telehealth: Payer: Self-pay | Admitting: Cardiology

## 2012-06-15 NOTE — Telephone Encounter (Signed)
I spoke with Adelina Mings at [facility] and she said we need to call [facility] about Silver Sneakers [phone].

## 2012-06-15 NOTE — Telephone Encounter (Signed)
New Problem:   Patient called needing you to call his insurance company [employer] - [phone] to justify why the patient needs silver sneakers, a water aerobics class.  He said that it helps his overall health more than anything.  Please call back if you have any questions.

## 2012-06-18 ENCOUNTER — Ambulatory Visit (INDEPENDENT_AMBULATORY_CARE_PROVIDER_SITE_OTHER): Payer: Medicare Other | Admitting: Cardiology

## 2012-06-18 ENCOUNTER — Encounter: Payer: Self-pay | Admitting: Cardiology

## 2012-06-18 VITALS — BP 108/66 | HR 78 | Ht 67.0 in | Wt 177.0 lb

## 2012-06-18 DIAGNOSIS — I2510 Atherosclerotic heart disease of native coronary artery without angina pectoris: Secondary | ICD-10-CM

## 2012-06-18 DIAGNOSIS — E7800 Pure hypercholesterolemia, unspecified: Secondary | ICD-10-CM

## 2012-06-18 NOTE — Assessment & Plan Note (Signed)
He does not tolerate statins and is not on statins for quite some time. However, he is in generally good health of his lipid profiles not too bad.

## 2012-06-18 NOTE — Assessment & Plan Note (Signed)
The patient's last cardiac catheterization was in 2005. He's continued to do extremely well. He's not had any major problems. We'll continue him on medical therapy.

## 2012-06-18 NOTE — Progress Notes (Signed)
HPI:  The patient is in for followup. Cardiac wise he is doing well. He saw our NP recently and has been doing well.  No major issues cardiac wise.  Patient does have cataracts and macular degeneration, but has been cleared by Dr. Ashley Royalty to continue driving. He continues to swim, and his close friend recently passed away he does no longer have a partner.    Current Outpatient Prescriptions  Medication Sig Dispense Refill  . aspirin 81 MG tablet Take 81 mg by mouth daily.        . Cholecalciferol (VITAMIN D-3) 5000 UNITS TABS Take by mouth daily.        . clotrimazole-betamethasone (LOTRISONE) lotion Apply topically 2 (two) times daily.        . Coenzyme Q10 (COQ10) 100 MG CAPS Take by mouth daily.        Marland Kitchen guaifenesin (MUCUS RELIEF) 400 MG TABS Take 400 mg by mouth as needed.        . isosorbide dinitrate (ISORDIL) 10 MG tablet Take 1 tablet (10 mg total) by mouth 3 (three) times daily.  270 tablet  3  . metoprolol succinate (TOPROL-XL) 25 MG 24 hr tablet Take 1 tablet (25 mg total) by mouth daily.  90 tablet  3  . Multiple Vitamins-Minerals (ANTIOXIDANT) TABS Take by mouth daily.        . nitroGLYCERIN (NITROSTAT) 0.4 MG SL tablet Place 0.4 mg under the tongue every 5 (five) minutes as needed.        . Selenium 200 MCG CAPS Take by mouth daily.        . Senna 187 MG TABS Take by mouth daily. 2 po once daily prn as needed        Allergies  Allergen Reactions  . Cefprozil     REACTION: insomnia  . Simvastatin     REACTION: MUSCLE ACHES    Past Medical History  Diagnosis Date  . Loss of height   . Compression fracture   . Hypertension   . Hypercholesterolemia   . Rhinitis   . Dyspnea   . Weight loss   . CAD (coronary artery disease)   . Peptic ulcer disease   . Alcohol abuse   . AMI (acute mesenteric ischemia)   . Colon polyps   . Pleurisy with effusion   . Grief reaction     Past Surgical History  Procedure Date  . Exploratory laparotomy 1978    due to kidney  problems  . Thoracotomy 1982    hemothorax  . Back surgery 2003    Cervical Spondylosis  . Spine surgery 2003    C-spine  . Cardiac catheterization 06/21/03  . Coronary stent placement 07/2003    x3  . Coronary angioplasty with stent placement 05/2004  . Colonoscopy w/ polypectomy 11/2006    Family History  Problem Relation Age of Onset  . Diabetes Father   . Colon cancer Mother     History   Social History  . Marital Status: Divorced    Spouse Name: N/A    Number of Children: N/A  . Years of Education: N/A   Occupational History  . Not on file.   Social History Main Topics  . Smoking status: Never Smoker   . Smokeless tobacco: Not on file  . Alcohol Use: No  . Drug Use: No  . Sexually Active: Not Currently   Other Topics Concern  . Not on file   Social History Narrative  .  No narrative on file    ROS: Please see the HPI.  All other systems reviewed and negative.  PHYSICAL EXAM:  BP 108/66  Pulse 78  Ht 5\' 7"  (1.702 m)  Wt 177 lb (80.287 kg)  BMI 27.72 kg/m2  General: Well developed, well nourished, in no acute distress. Head:  Normocephalic and atraumatic. Neck: no JVD Lungs: Clear to auscultation and percussion. Heart: Normal S1 and S2.  No murmur, rubs or gallops.  Pulses: Pulses normal in all 4 extremities. Extremities: No clubbing or cyanosis. No edema. Neurologic: Alert and oriented x 3.  EKG:  Done recently.   ASSESSMENT AND PLAN:

## 2012-06-18 NOTE — Patient Instructions (Addendum)
Your physician recommends that you continue on your current medications as directed. Please refer to the Current Medication list given to you today. Your physician wants you to follow-up in: 1 YEAR WITH DR. MCALHANY. You will receive a reminder letter in the mail two months in advance. If you don't receive a letter, please call our office to schedule the follow-up appointment.  

## 2012-06-19 ENCOUNTER — Ambulatory Visit: Payer: Medicare Other | Admitting: Cardiology

## 2012-06-21 ENCOUNTER — Telehealth: Payer: Self-pay | Admitting: Internal Medicine

## 2012-06-21 ENCOUNTER — Other Ambulatory Visit: Payer: Self-pay | Admitting: Cardiology

## 2012-06-21 MED ORDER — METOPROLOL SUCCINATE ER 50 MG PO TB24: ORAL_TABLET | ORAL | Status: DC

## 2012-06-21 MED ORDER — ISOSORBIDE MONONITRATE ER 30 MG PO TB24: 30.0000 mg | ORAL_TABLET | Freq: Every day | ORAL | Status: DC

## 2012-06-21 NOTE — Telephone Encounter (Signed)
He has seen Dr.Hopper with in the last year, ok to schedule nurse visit shingles vaccine please re-enforce that patient check with insurance prior to appointment

## 2012-06-21 NOTE — Telephone Encounter (Signed)
Pt called requesting to come get a shingles vaccine. He states he just had a physical with Dr. Riley Kill a few days ago and does not need another one. Can I schedule him for just the vaccine? Please advise.

## 2012-06-21 NOTE — Telephone Encounter (Signed)
Pt scheduled. Thank you

## 2012-06-26 ENCOUNTER — Ambulatory Visit (INDEPENDENT_AMBULATORY_CARE_PROVIDER_SITE_OTHER): Payer: Medicare Other

## 2012-06-26 ENCOUNTER — Ambulatory Visit (INDEPENDENT_AMBULATORY_CARE_PROVIDER_SITE_OTHER): Payer: Medicare Other | Admitting: Ophthalmology

## 2012-06-26 DIAGNOSIS — Z2911 Encounter for prophylactic immunotherapy for respiratory syncytial virus (RSV): Secondary | ICD-10-CM

## 2012-06-26 DIAGNOSIS — Z23 Encounter for immunization: Secondary | ICD-10-CM

## 2012-06-26 NOTE — Patient Instructions (Signed)
Patient stated he did not check with his insurance company and if they do not cover he has no problem paying for Shingles Vaccine

## 2012-07-13 ENCOUNTER — Encounter (HOSPITAL_BASED_OUTPATIENT_CLINIC_OR_DEPARTMENT_OTHER): Payer: Self-pay | Admitting: Emergency Medicine

## 2012-07-13 ENCOUNTER — Ambulatory Visit (INDEPENDENT_AMBULATORY_CARE_PROVIDER_SITE_OTHER): Payer: Medicare Other | Admitting: Family

## 2012-07-13 ENCOUNTER — Emergency Department (HOSPITAL_BASED_OUTPATIENT_CLINIC_OR_DEPARTMENT_OTHER): Payer: Medicare Other

## 2012-07-13 ENCOUNTER — Encounter: Payer: Self-pay | Admitting: Family

## 2012-07-13 ENCOUNTER — Emergency Department (HOSPITAL_BASED_OUTPATIENT_CLINIC_OR_DEPARTMENT_OTHER)
Admission: EM | Admit: 2012-07-13 | Discharge: 2012-07-13 | Disposition: A | Discharge status: Home / Self Care | Payer: Medicare Other | Attending: Emergency Medicine | Admitting: Emergency Medicine

## 2012-07-13 VITALS — BP 130/70 | HR 74 | Temp 99.0°F | Resp 20 | Wt 176.0 lb

## 2012-07-13 DIAGNOSIS — R609 Edema, unspecified: Secondary | ICD-10-CM | POA: Insufficient documentation

## 2012-07-13 DIAGNOSIS — R079 Chest pain, unspecified: Secondary | ICD-10-CM

## 2012-07-13 DIAGNOSIS — E7800 Pure hypercholesterolemia, unspecified: Secondary | ICD-10-CM | POA: Insufficient documentation

## 2012-07-13 DIAGNOSIS — R059 Cough, unspecified: Secondary | ICD-10-CM | POA: Insufficient documentation

## 2012-07-13 DIAGNOSIS — Z8711 Personal history of peptic ulcer disease: Secondary | ICD-10-CM | POA: Insufficient documentation

## 2012-07-13 DIAGNOSIS — R0989 Other specified symptoms and signs involving the circulatory and respiratory systems: Secondary | ICD-10-CM | POA: Insufficient documentation

## 2012-07-13 DIAGNOSIS — I10 Essential (primary) hypertension: Secondary | ICD-10-CM | POA: Insufficient documentation

## 2012-07-13 DIAGNOSIS — J45909 Unspecified asthma, uncomplicated: Secondary | ICD-10-CM

## 2012-07-13 DIAGNOSIS — R509 Fever, unspecified: Secondary | ICD-10-CM | POA: Insufficient documentation

## 2012-07-13 DIAGNOSIS — I2510 Atherosclerotic heart disease of native coronary artery without angina pectoris: Secondary | ICD-10-CM | POA: Insufficient documentation

## 2012-07-13 DIAGNOSIS — R05 Cough: Secondary | ICD-10-CM | POA: Insufficient documentation

## 2012-07-13 DIAGNOSIS — Z79899 Other long term (current) drug therapy: Secondary | ICD-10-CM | POA: Insufficient documentation

## 2012-07-13 DIAGNOSIS — R0609 Other forms of dyspnea: Secondary | ICD-10-CM | POA: Insufficient documentation

## 2012-07-13 DIAGNOSIS — Z7982 Long term (current) use of aspirin: Secondary | ICD-10-CM | POA: Insufficient documentation

## 2012-07-13 DIAGNOSIS — R0602 Shortness of breath: Secondary | ICD-10-CM | POA: Insufficient documentation

## 2012-07-13 LAB — CBC WITH DIFFERENTIAL/PLATELET
Basophils Relative: 0 % (ref 0–1)
Eosinophils Relative: 4 % (ref 0–5)
HCT: 34.7 % — ABNORMAL LOW (ref 39.0–52.0)
Hemoglobin: 11.7 g/dL — ABNORMAL LOW (ref 13.0–17.0)
Lymphocytes Relative: 23 % (ref 12–46)
MCH: 30.3 pg (ref 26.0–34.0)
Neutrophils Relative %: 63 % (ref 43–77)
RBC: 3.86 MIL/uL — ABNORMAL LOW (ref 4.22–5.81)

## 2012-07-13 LAB — COMPREHENSIVE METABOLIC PANEL
Alkaline Phosphatase: 104 U/L (ref 39–117)
BUN: 21 mg/dL (ref 6–23)
Chloride: 105 mEq/L (ref 96–112)
GFR calc Af Amer: 69 mL/min — ABNORMAL LOW (ref >90)
GFR calc non Af Amer: 59 mL/min — ABNORMAL LOW (ref >90)
Glucose, Bld: 125 mg/dL — ABNORMAL HIGH (ref 70–99)
Potassium: 4 mEq/L (ref 3.5–5.1)
Total Bilirubin: 0.6 mg/dL (ref 0.3–1.2)

## 2012-07-13 LAB — PROTIME-INR: Prothrombin Time: 15 seconds (ref 11.6–15.2)

## 2012-07-13 MED ORDER — METHYLPREDNISOLONE SODIUM SUCC 125 MG IJ SOLR
125.0000 mg | Freq: Once | INTRAMUSCULAR | Status: AC
  Administered 2012-07-13: 125 mg via INTRAVENOUS
  Filled 2012-07-13: qty 2

## 2012-07-13 MED ORDER — LEVOFLOXACIN 500 MG PO TABS: 500.0000 mg | ORAL_TABLET | Freq: Every day | ORAL | Status: DC

## 2012-07-13 MED ORDER — ALBUTEROL SULFATE HFA 108 (90 BASE) MCG/ACT IN AERS
2.0000 | INHALATION_SPRAY | RESPIRATORY_TRACT | Status: DC | PRN
  Administered 2012-07-13: 2 via RESPIRATORY_TRACT
  Filled 2012-07-13: qty 6.7

## 2012-07-13 MED ORDER — IOHEXOL 350 MG/ML SOLN
80.0000 mL | Freq: Once | INTRAVENOUS | Status: AC | PRN
  Administered 2012-07-13: 80 mL via INTRAVENOUS

## 2012-07-13 MED ORDER — PREDNISONE 10 MG PO TABS: 20.0000 mg | ORAL_TABLET | Freq: Every day | ORAL | Status: DC

## 2012-07-13 MED ORDER — SODIUM CHLORIDE 0.9 % IV BOLUS (SEPSIS)
500.0000 mL | Freq: Once | INTRAVENOUS | Status: AC
  Administered 2012-07-13: 500 mL via INTRAVENOUS

## 2012-07-13 MED ORDER — ALBUTEROL SULFATE (5 MG/ML) 0.5% IN NEBU
5.0000 mg | INHALATION_SOLUTION | Freq: Once | RESPIRATORY_TRACT | Status: AC
  Administered 2012-07-13: 5 mg via RESPIRATORY_TRACT

## 2012-07-13 MED ORDER — IPRATROPIUM BROMIDE 0.02 % IN SOLN
RESPIRATORY_TRACT | Status: AC
  Administered 2012-07-13: 0.5 mg via RESPIRATORY_TRACT
  Filled 2012-07-13: qty 2.5

## 2012-07-13 MED ORDER — ALBUTEROL SULFATE (5 MG/ML) 0.5% IN NEBU
INHALATION_SOLUTION | RESPIRATORY_TRACT | Status: AC
  Administered 2012-07-13: 5 mg via RESPIRATORY_TRACT
  Filled 2012-07-13: qty 1

## 2012-07-13 MED ORDER — IPRATROPIUM BROMIDE 0.02 % IN SOLN
0.5000 mg | Freq: Once | RESPIRATORY_TRACT | Status: AC
  Administered 2012-07-13: 0.5 mg via RESPIRATORY_TRACT

## 2012-07-13 MED ORDER — LEVOFLOXACIN IN D5W 750 MG/150ML IV SOLN
750.0000 mg | INTRAVENOUS | Status: DC
  Administered 2012-07-13: 750 mg via INTRAVENOUS
  Filled 2012-07-13: qty 150

## 2012-07-13 MED ORDER — ALBUTEROL SULFATE (5 MG/ML) 0.5% IN NEBU
INHALATION_SOLUTION | RESPIRATORY_TRACT | Status: AC
  Filled 2012-07-13: qty 1

## 2012-07-13 NOTE — ED Provider Notes (Signed)
History     CSN: [id number]  Arrival date & time 07/13/12  1534   First MD Initiated Contact with Patient 07/13/12 1538      Chief Complaint  Patient presents with  . Shortness of Breath  . Chest Pain    (Consider location/radiation/quality/duration/timing/severity/associated sxs/prior treatment) HPI Patient complaining of cough, dyspnea, subjective fever, and chills for several days. He has had some upper respiratory in infection symptoms. He was seen by his primary care doctor and sent to the emergency department. He denies any chest pain with this. He denies any history of COPD or asthma. Past Medical History  Diagnosis Date  . Loss of height   . Compression fracture   . Hypertension   . Hypercholesterolemia   . Rhinitis   . Dyspnea   . Weight loss   . CAD (coronary artery disease)   . Peptic ulcer disease   . Alcohol abuse   . AMI (acute mesenteric ischemia)   . Colon polyps   . Pleurisy with effusion   . Grief reaction     Past Surgical History  Procedure Date  . Exploratory laparotomy 1978    due to kidney problems  . Thoracotomy 1982    hemothorax  . Back surgery 2003    Cervical Spondylosis  . Spine surgery 2003    C-spine  . Cardiac catheterization 06/21/03  . Coronary stent placement 07/2003    x3  . Coronary angioplasty with stent placement 05/2004  . Colonoscopy w/ polypectomy 11/2006    Family History  Problem Relation Age of Onset  . Diabetes Father   . Colon cancer Mother     History  Substance Use Topics  . Smoking status: Never Smoker   . Smokeless tobacco: Not on file  . Alcohol Use: No      Review of Systems  Constitutional: Negative for fever and chills.  HENT: Negative for neck stiffness.   Eyes: Negative for visual disturbance.  Respiratory: Positive for shortness of breath.   Cardiovascular: Negative for chest pain.  Gastrointestinal: Negative for vomiting, diarrhea and blood in stool.  Genitourinary: Negative for dysuria,  frequency and decreased urine volume.  Musculoskeletal: Negative for myalgias and joint swelling.  Skin: Negative for rash.  Neurological: Negative for weakness.  Hematological: Negative for adenopathy.  Psychiatric/Behavioral: Negative for agitation.    Allergies  Cefprozil and Simvastatin  Home Medications   Current Outpatient Rx  Name Route Sig Dispense Refill  . ASPIRIN 81 MG PO TABS Oral Take 81 mg by mouth daily.      Marland Kitchen VITAMIN D-3 5000 UNITS PO TABS Oral Take by mouth daily.      Marland Kitchen CLOTRIMAZOLE-BETAMETHASONE 1-0.05 % EX LOTN Topical Apply topically 2 (two) times daily.      . COQ10 100 MG PO CAPS Oral Take by mouth daily.      . GUAIFENESIN 400 MG PO TABS Oral Take 400 mg by mouth as needed.      . ISOSORBIDE MONONITRATE ER 30 MG PO TB24 Oral Take 1 tablet (30 mg total) by mouth daily. 30 tablet 1  . METOPROLOL SUCCINATE ER 50 MG PO TB24  Take 1/2 tablet two times daily. 30 tablet 1  . ANTIOXIDANT PO TABS Oral Take by mouth daily.      Marland Kitchen NITROGLYCERIN 0.4 MG SL SUBL Sublingual Place 0.4 mg under the tongue every 5 (five) minutes as needed.      . SELENIUM 200 MCG PO CAPS Oral Take by mouth daily.      Marland Kitchen  SENNA 187 MG PO TABS Oral Take by mouth daily. 2 po once daily prn as needed      BP 115/67  Pulse 70  Temp 98.6 F (37 C) (Oral)  Resp 20  Ht 5' 7.5" (1.715 m)  Wt 176 lb (79.833 kg)  BMI 27.16 kg/m2  SpO2 95%  Physical Exam  Nursing note and vitals reviewed. Constitutional: He is oriented to person, place, and time. He appears well-developed.  HENT:  Head: Normocephalic and atraumatic.  Right Ear: External ear normal.  Left Ear: External ear normal.  Nose: Nose normal.  Mouth/Throat: Oropharynx is clear and moist.  Eyes: Conjunctivae normal are normal. Pupils are equal, round, and reactive to light.  Neck: Normal range of motion. Neck supple.  Cardiovascular: Normal rate, regular rhythm, normal heart sounds and intact distal pulses.   Pulmonary/Chest: Effort  normal. He has wheezes.  Abdominal: Soft. Bowel sounds are normal.  Musculoskeletal: Normal range of motion.       Trace pitting edema bilateral lower extremities no swelling noted  Neurological: He is alert and oriented to person, place, and time. He has normal reflexes.  Skin: Skin is warm and dry.  Psychiatric: He has a normal mood and affect.    ED Course  Procedures (including critical care time)  Labs Reviewed  CBC WITH DIFFERENTIAL - Abnormal; Notable for the following:    WBC 12.0 (*)     RBC 3.86 (*)     Hemoglobin 11.7 (*)     HCT 34.7 (*)     RDW 24.4 (*)     Monocytes Absolute 1.2 (*)     All other components within normal limits  COMPREHENSIVE METABOLIC PANEL - Abnormal; Notable for the following:    Glucose, Bld 125 (*)     GFR calc non Af Amer 59 (*)     GFR calc Af Amer 69 (*)     All other components within normal limits  D-DIMER, QUANTITATIVE - Abnormal; Notable for the following:    D-Dimer, Quant 0.56 (*)     All other components within normal limits  PRO B NATRIURETIC PEPTIDE - Abnormal; Notable for the following:    Pro B Natriuretic peptide (BNP) 583.3 (*)     All other components within normal limits  PROTIME-INR  TROPONIN I   Dg Chest 2 View  07/13/2012  *RADIOLOGY REPORT*  Clinical Data: Shortness of breath and chest pain.  Cough.  CHEST - 2 VIEW  Comparison: Chest radiograph 04/13/2011 and 05/12/2004.  CT chest 08/15/2003.  Findings: Heart size is normal.  Mediastinal and hilar contours are stable and within normal limits.  There is linear scarring at the right lung base, stable. The lungs are hyperinflated.  No focal airspace disease is identified. Negative for pleural effusion or pneumothorax.  IMPRESSION:  Hyperinflation of the lungs. Question if the patient has COPD or asthma.   Original Report Authenticated By: Britta Mccreedy, M.D.    Ct Angio Chest Pe W/cm &/or Wo Cm  07/13/2012  *RADIOLOGY REPORT*  Clinical Data: Cough, fever, shortness of  breath, elevated D-dimer.  CT ANGIOGRAPHY CHEST  Technique:  Multidetector CT imaging of the chest using the standard protocol during bolus administration of intravenous contrast. Multiplanar reconstructed images including MIPs were obtained and reviewed to evaluate the vascular anatomy.  Contrast: 80mL OMNIPAQUE IOHEXOL 350 MG/ML SOLN  Comparison: Chest radiographs obtained earlier today and chest CTA dated 08/15/2003.  Findings: Normally opacified pulmonary arteries with no pulmonary arterial filling defects.  Small amount of subpleural scarring in both lungs.  No lung masses or enlarged lymph nodes.  Small hiatal hernia.  Thoracic spine degenerative changes.  The included portion of the upper abdomen is unremarkable.  IMPRESSION:  1.  No pulmonary emboli or acute abnormality. 2.  Small hiatal hernia.   Original Report Authenticated By: Darrol Angel, M.D.      No diagnosis found.    MDM   Results for orders placed during the hospital encounter of 07/13/12  CBC WITH DIFFERENTIAL      Component Value Range   WBC 12.0 (*) 4.0 - 10.5 K/uL   RBC 3.86 (*) 4.22 - 5.81 MIL/uL   Hemoglobin 11.7 (*) 13.0 - 17.0 g/dL   HCT 16.1 (*) 09.6 - 04.5 %   MCV 89.9  78.0 - 100.0 fL   MCH 30.3  26.0 - 34.0 pg   MCHC 33.7  30.0 - 36.0 g/dL   RDW 40.9 (*) 81.1 - 91.4 %   Platelets 329  150 - 400 K/uL   Neutrophils Relative 63  43 - 77 %   Lymphocytes Relative 23  12 - 46 %   Monocytes Relative 10  3 - 12 %   Eosinophils Relative 4  0 - 5 %   Basophils Relative 0  0 - 1 %   Neutro Abs 7.5  1.7 - 7.7 K/uL   Lymphs Abs 2.8  0.7 - 4.0 K/uL   Monocytes Absolute 1.2 (*) 0.1 - 1.0 K/uL   Eosinophils Absolute 0.5  0.0 - 0.7 K/uL   Basophils Absolute 0.0  0.0 - 0.1 K/uL  COMPREHENSIVE METABOLIC PANEL      Component Value Range   Sodium 141  135 - 145 mEq/L   Potassium 4.0  3.5 - 5.1 mEq/L   Chloride 105  96 - 112 mEq/L   CO2 26  19 - 32 mEq/L   Glucose, Bld 125 (*) 70 - 99 mg/dL   BUN 21  6 - 23 mg/dL    Creatinine, Ser 7.82  0.50 - 1.35 mg/dL   Calcium 8.8  8.4 - 95.6 mg/dL   Total Protein 6.1  6.0 - 8.3 g/dL   Albumin 3.6  3.5 - 5.2 g/dL   AST 26  0 - 37 U/L   ALT 26  0 - 53 U/L   Alkaline Phosphatase 104  39 - 117 U/L   Total Bilirubin 0.6  0.3 - 1.2 mg/dL   GFR calc non Af Amer 59 (*) >90 mL/min   GFR calc Af Amer 69 (*) >90 mL/min  PROTIME-INR      Component Value Range   Prothrombin Time 15.0  11.6 - 15.2 seconds   INR 1.20  0.00 - 1.49  TROPONIN I      Component Value Range   Troponin I <0.30  <0.30 ng/mL  D-DIMER, QUANTITATIVE      Component Value Range   D-Dimer, Quant 0.56 (*) 0.00 - 0.48 ug/mL-FEU  PRO B NATRIURETIC PEPTIDE      Component Value Range   Pro B Natriuretic peptide (BNP) 583.3 (*) 0 - 450 pg/mL     Date: 07/13/2012  Rate: 68  Rhythm: normal sinus rhythm  QRS Axis: normal  Intervals: normal  ST/T Wave abnormalities: normal  Conduction Disutrbances: none  Narrative Interpretation: unremarkable  Patient with resolution of wheezing and decreased dyspnea here after albuterol nebulizer x2, Solu-Medrol, and Levaquin. He'll be placed on prednisone and given albuterol MDI with spacer. He  is encouraged to return if he feels worse at any time. He is to followup with primary care Dr. on Monday. Patient appears to have asthmatic bronchitis which is improving with treatment here.          Hilario Quarry, MD 07/13/12 2017

## 2012-07-13 NOTE — Assessment & Plan Note (Addendum)
Pt with low grade fever, shortness of breath, mild hypoxia (oxygen sat drops to 90% on room air with ambulation), asymmetric lower extremity edema, and RLL crackles.  Concerning for possible pneumonia.  Also has hx of DVT and recurrent dvt + PE is another possibility. Chest pain needs evaluation with serial cardiac enzymes. EKG performed here in office is reviewed and notes NSR without acute ischemic changes evident. Spoke with pt and have recommended that he be evaluated in the ED and he is agreeable. He will be transported down in a wheelchair. Report given to Amy charge nurse at [facility] ED.

## 2012-07-13 NOTE — Patient Instructions (Signed)
Instructed pt on the proper use of administering albuteral mdi via aerochamber pt tolerated well 

## 2012-07-13 NOTE — Patient Instructions (Addendum)
Please proceed to the ED on the first floor.  Hope you feel better!

## 2012-07-13 NOTE — Progress Notes (Signed)
Subjective:    Patient ID: Daniel Ali, male    DOB: 1924/12/19, 78 y.o.   MRN: [id number]  HPI  Mr.  Araki is an 78 yr old male who presents today with chief complaint of cough. He reports that symptoms started 2 days ago after he came out of the swimming pool from doing laps.  He reports associated fever and shortness of breath.  SOB is worse with ambulation.  He also reports "chest feels tight" across- especially with breathing.  Cough is productive of yellow/green sputum.      Review of Systems See HPI  Past Medical History  Diagnosis Date  . Loss of height   . Compression fracture   . Hypertension   . Hypercholesterolemia   . Rhinitis   . Dyspnea   . Weight loss   . CAD (coronary artery disease)   . Peptic ulcer disease   . Alcohol abuse   . AMI (acute mesenteric ischemia)   . Colon polyps   . Pleurisy with effusion   . Grief reaction     History   Social History  . Marital Status: Divorced    Spouse Name: N/A    Number of Children: N/A  . Years of Education: N/A   Occupational History  . Not on file.   Social History Main Topics  . Smoking status: Never Smoker   . Smokeless tobacco: Not on file  . Alcohol Use: No  . Drug Use: No  . Sexually Active: Not Currently   Other Topics Concern  . Not on file   Social History Narrative  . No narrative on file    Past Surgical History  Procedure Date  . Exploratory laparotomy 1978    due to kidney problems  . Thoracotomy 1982    hemothorax  . Back surgery 2003    Cervical Spondylosis  . Spine surgery 2003    C-spine  . Cardiac catheterization 06/21/03  . Coronary stent placement 07/2003    x3  . Coronary angioplasty with stent placement 05/2004  . Colonoscopy w/ polypectomy 11/2006    Family History  Problem Relation Age of Onset  . Diabetes Father   . Colon cancer Mother     Allergies  Allergen Reactions  . Cefprozil     REACTION: insomnia  . Simvastatin     REACTION: MUSCLE ACHES     Current Outpatient Prescriptions on File Prior to Visit  Medication Sig Dispense Refill  . aspirin 81 MG tablet Take 81 mg by mouth daily.        . Cholecalciferol (VITAMIN D-3) 5000 UNITS TABS Take by mouth daily.        . Coenzyme Q10 (COQ10) 100 MG CAPS Take by mouth daily.        Marland Kitchen guaifenesin (MUCUS RELIEF) 400 MG TABS Take 400 mg by mouth as needed.        . isosorbide mononitrate (IMDUR) 30 MG 24 hr tablet Take 1 tablet (30 mg total) by mouth daily.  30 tablet  1  . metoprolol succinate (TOPROL-XL) 50 MG 24 hr tablet Take 1/2 tablet two times daily.  30 tablet  1  . Multiple Vitamins-Minerals (ANTIOXIDANT) TABS Take by mouth daily.        . nitroGLYCERIN (NITROSTAT) 0.4 MG SL tablet Place 0.4 mg under the tongue every 5 (five) minutes as needed.        . clotrimazole-betamethasone (LOTRISONE) lotion Apply topically 2 (two) times daily.        Marland Kitchen  Selenium 200 MCG CAPS Take by mouth daily.        . Senna 187 MG TABS Take by mouth daily. 2 po once daily prn as needed        BP 130/70  Pulse 74  Temp 99 F (37.2 C) (Oral)  Resp 20  Wt 176 lb (79.833 kg)  SpO2 94%       Objective:   Physical Exam  Constitutional: He is oriented to person, place, and time. He appears well-developed.       Pale elderly white male noted to have slight dyspnea at rest.    HENT:  Head: Normocephalic and atraumatic.  Cardiovascular: Normal rate and regular rhythm.   No murmur heard. Pulmonary/Chest: He has no wheezes. He has rales in the right lower field. He exhibits no tenderness.       Mild increased work of breathting.   Abdominal: Soft. Bowel sounds are normal. He exhibits no distension and no mass. There is no tenderness. There is no rebound and no guarding.  Musculoskeletal:       2+ RLE edema, 1+ LLE edema  Neurological: He is alert and oriented to person, place, and time.  Skin: Skin is warm and dry.  Psychiatric: He has a normal mood and affect. His behavior is normal. Judgment and  thought content normal.          Assessment & Plan:

## 2012-07-13 NOTE — ED Notes (Signed)
Pt was upstairs at an appt for coughing and SOB and runny nose.

## 2012-07-25 ENCOUNTER — Telehealth: Payer: Self-pay | Admitting: Internal Medicine

## 2012-07-25 NOTE — Telephone Encounter (Signed)
Spoke with patient, gave instruction. Patient states he thinks he is getting better, patient will call for appointment if needed

## 2012-07-25 NOTE — Telephone Encounter (Signed)
Caller: Gaven/Patient; Patient Name: Particia Lather; PCP: Marga Melnick; Best Callback Phone Number: [phone]; Calling today 07/25/12 regarding having upset stomach, onset this AM around 1 AM.  Vomited x1.  Diarrhea x2.  Afebrile.  Pt has been taking Mucinex since he went to ED 07/19/12 per ED doctor recommendation for coughing.  Said nausea started after that.  Emergent symptoms r/o by Nausea or Vomiting guidelines with exception of symptoms began after starting or changing dose of prescription, nonprescription, alternative medicatoin or illicit drug.  (Call provider Within 24 Hours).  Care advice given.  (sips of clears and add starchy bland foods when nausea subsides).  Triager pulled side effects of Mucinex and said gastrointestonal symptoms are rare if following the correct recommended dose.  Triager advised pt he may have picked up a stomach virus while at the ED last week.  OFFICE PLEASE CALL PT BACK AT [phone] IF MD HAS ANY OTHER RECOMMENDATIONS.

## 2012-07-25 NOTE — Telephone Encounter (Signed)
Dr.Hopper please review and if any further recommendations please advise otherwise home care advice given by C-A-N

## 2012-07-25 NOTE — Telephone Encounter (Signed)
OV 10/17 or 10/18 if not better with clear liquids for 48-72 hours or until bowels are normal.This would include  jello, sherbert (NOT ice cream), Lipton's chicken noodle soup(NOT cream based soups),Gatorade Lite, flat Ginger ale (without High Fructose Corn Syrup),dry toast or crackers, baked potato.No milk , dairy or grease until bowels are formed. Align , a [employer] , daily if stools are loose. Immodium AD for frankly watery stool. Report increasing pain, fever or rectal bleeding

## 2012-08-02 ENCOUNTER — Telehealth: Payer: Self-pay | Admitting: Internal Medicine

## 2012-08-02 ENCOUNTER — Other Ambulatory Visit: Payer: Self-pay | Admitting: Internal Medicine

## 2012-08-02 NOTE — Telephone Encounter (Signed)
Caller: Yichen/Patient; Patient Name: Daniel Ali; PCP: Marga Melnick; Best Callback Phone Number: [phone]; Reason for call: Patient requesting a refill on Combivent . Combivent on hand written in past by Dr. Alwyn Ren and expired 2004.  Patient states he has been sitting at the pharmacy for three hours waiting for script to be called in. Reports "struggling to breathe". SOB prsent since 10/22 and is worse today.  All emergent symptoms ruled out per Breathing Problems guideline with exception " worsening breathing problems and known cardiac and or respiratory condition and not responding to treatment or treatment is not available.  Advised caller to hang up and call 911 for emergency transport to ER.

## 2012-08-02 NOTE — Telephone Encounter (Signed)
Caller calling back. States he is so hungry that he is going home to eat and he has an inhaler at home another MD gave to him.  Caller does not know name of med. States it will take him approx. 20 mins. to drive home. Nurse will make f/u call to patient in 20 mins. to discuss med. available to patient at his home.

## 2012-08-02 NOTE — Telephone Encounter (Signed)
Caller calling back. States he is so hungry that he is going home to eat and he has an inhaler at home another MD gave to him.  Caller does not know name of med. States it will take him approx. 20 mins. to drive home. Nurse will make f/u call to patient in 20 mins. to discuss med. available to patient at his home.   08/02/12, 18:13pm, Follow up call to patient . States he has arrived home and ate a few crackers.  Caller states inhaler is [place] with Ventolin inhaler.- aero chamber plus CStat- directions are : take two inhales every 4 hours as needed. Last used inhaler approx. 9am to 10am this am.   Caller states he was told by Dr. Marla Roe at the [facility] hosp. by phone this am she would prefer he use the Combivent inhaler patient had on hand but he did not realize med was expired. Caller used Ventolin inhaler x two puffs during this phone call at 8:20pm.  Med is not expired and is good till August 2014. Caller does not plan to call 911 as prev. recommended by triage nurse.   At end of call , patient states he is feeling better and is no longer panting. Denies any chest discomfort . Denies any fever. Discussed with patient he can use inhaler at two puffs every 4-6 hours as needed. Advised to call 911 for struggling to breathe or gasping for breath.. Also, advised to call overnight triage nurse if has any concerns or questions. Appointment sched. for patient to see Dr. Drue Novel on Friday, Oct. 25th at 9:00am for eval. of sxymptoms and discussion about refill on Combivent inhaler.

## 2012-08-03 ENCOUNTER — Encounter: Payer: Self-pay | Admitting: Internal Medicine

## 2012-08-03 ENCOUNTER — Ambulatory Visit (INDEPENDENT_AMBULATORY_CARE_PROVIDER_SITE_OTHER): Payer: Medicare Other | Admitting: Internal Medicine

## 2012-08-03 VITALS — BP 130/62 | HR 66 | Temp 97.6°F | Wt 180.0 lb

## 2012-08-03 DIAGNOSIS — I2510 Atherosclerotic heart disease of native coronary artery without angina pectoris: Secondary | ICD-10-CM

## 2012-08-03 DIAGNOSIS — R0602 Shortness of breath: Secondary | ICD-10-CM

## 2012-08-03 MED ORDER — IPRATROPIUM-ALBUTEROL 18-103 MCG/ACT IN AERO: 2.0000 | INHALATION_SPRAY | Freq: Four times a day (QID) | RESPIRATORY_TRACT | Status: DC | PRN

## 2012-08-03 MED ORDER — FLUTICASONE PROPIONATE 50 MCG/ACT NA SUSP: 2.0000 | Freq: Every day | NASAL | Status: DC

## 2012-08-03 MED ORDER — ISOSORBIDE MONONITRATE ER 30 MG PO TB24: 30.0000 mg | ORAL_TABLET | Freq: Every day | ORAL | Status: DC

## 2012-08-03 NOTE — Assessment & Plan Note (Addendum)
78 year old gentleman presents with cough, yellow sputum production, occasional wheezing and dyspnea on exertion. He has no previous history of COPD, recent CT showed pulmonary scarring, chest x-ray showed hyperinflation. The patient has  Asthma or COPD although he report no h/o heavy smoking. He did recall using Advair at some point in the past. Plan: PFTs Start Symbicort Was recommended to change from albuterol to Combivent, I don't disagree, prescription provided We'll get overnight pulse oximetry See PCP in 2 weeks ER if symptoms severe.

## 2012-08-03 NOTE — Telephone Encounter (Signed)
Seen today. 

## 2012-08-03 NOTE — Telephone Encounter (Signed)
Please note

## 2012-08-03 NOTE — Progress Notes (Signed)
  Subjective:    Patient ID: Daniel Ali, male    DOB: 1925-02-01, 78 y.o.   MRN: [id number]  HPI Acute visit Patient was seen 07/13/2012 with cough, dyspnea on exertion and chest tightness. O2 sat was 90%. Was sent to the ED, hemoglobin was 11.2, WBCs 12, CMP was okay. D-dimer was slightly elevated, BNP 583 which is elevated. Chest x-ray showed no pneumonia, CT chest showed no PE. Patient felt better after nebulization and was discharged home on Levaquin.  Reason for today's visit:  he continue with cough, producing yellow or white sputum, no hemoptysis he continue also with dyspnea on exertion that even disrupt some of his activities of daily living. Has occ wheezing Med list reviewed: Currently using Ventolin. Was recommend to switch to Combivent by the [facility] doctor Not using Imdur  Past Medical History  Diagnosis Date  . Loss of height   . Compression fracture   . Hypertension   . Hypercholesterolemia   . Rhinitis   . Dyspnea   . Weight loss   . CAD (coronary artery disease)   . Peptic ulcer disease   . Alcohol abuse   . AMI (acute mesenteric ischemia)   . Colon polyps   . Pleurisy with effusion   . Grief reaction   . Macular degeneration    Past Surgical History  Procedure Date  . Exploratory laparotomy 1978    due to kidney problems  . Thoracotomy 1982    hemothorax  . Back surgery 2003    Cervical Spondylosis  . Spine surgery 2003    C-spine  . Cardiac catheterization 06/21/03  . Coronary stent placement 07/2003    x3  . Coronary angioplasty with stent placement 05/2004  . Colonoscopy w/ polypectomy 11/2006   History   Social History  . Marital Status: Divorced    Spouse Name: N/A    Number of Children: 4  . Years of Education: N/A   Occupational History  . Retired    Social History Main Topics  . Smoking status: Former Games developer  . Smokeless tobacco: Never Used   Comment: cigars during college  . Alcohol Use: No     recovered ETOH abuse  .  Drug Use: No  . Sexually Active: Not Currently   Other Topics Concern  . Not on file   Social History Narrative   Lives by him self , able to do all ADL    Review of Systems No fever or chills No sore throat, constant runny nose No chest pain, trace edema at baseline Got a flu shot 3 weeks ago     Objective:   Physical Exam General -- alert, well-developed . No apparent distress.  HEENT -- nose congested Lungs --  decreased breath sounds but otherwise clear Heart-- normal rate, regular rhythm, no murmur, and no gallop.   Extremities--  Symmetric +/+++ pretibial edema   Neurologic-- alert & oriented X3 and strength normal in all extremities. Psych-- Cognition and judgment appear intact. Alert and cooperative with normal attention span and concentration.  not anxious appearing and not depressed appearing.       Assessment & Plan:

## 2012-08-03 NOTE — Patient Instructions (Signed)
Stop ventolin Start Symbicort 2 puffs twice a day Combivent as needed for wheezing, cough. You can take 2 puffs up to 4 times a day Start the nose spray every day Go back to Imdur, a heart medicine Please schedule an office visit to see Dr. Alwyn Ren in 2 weeks ER if symptoms severe

## 2012-08-03 NOTE — Assessment & Plan Note (Signed)
Recommend to go back to imdur which he has not been taking. Prescription provided

## 2012-08-10 ENCOUNTER — Ambulatory Visit (INDEPENDENT_AMBULATORY_CARE_PROVIDER_SITE_OTHER): Payer: Medicare Other | Admitting: Internal Medicine

## 2012-08-10 DIAGNOSIS — R0602 Shortness of breath: Secondary | ICD-10-CM

## 2012-08-10 LAB — PULMONARY FUNCTION TEST

## 2012-08-10 NOTE — Progress Notes (Signed)
PFT done today. 

## 2012-08-12 ENCOUNTER — Telehealth: Payer: Self-pay | Admitting: Internal Medicine

## 2012-08-12 NOTE — Telephone Encounter (Signed)
Was seen with shortness or breath, please check on the patient, is he feeling better?Marland Kitchen Also needs a followup with PCP, please be sure that  is schedule.

## 2012-08-13 NOTE — Telephone Encounter (Signed)
Called pt, unable to leave a msg.   

## 2012-08-14 NOTE — Telephone Encounter (Signed)
Called pt, unable to leave a msg.   

## 2012-08-15 ENCOUNTER — Encounter: Payer: Self-pay | Admitting: *Deleted

## 2012-08-15 NOTE — Telephone Encounter (Signed)
Unable to get in touch with pt, mailed letter.  

## 2012-08-21 ENCOUNTER — Telehealth: Payer: Self-pay | Admitting: Internal Medicine

## 2012-08-21 NOTE — Telephone Encounter (Signed)
Advise patient, he does have some obstruction on PFTs, plan: Start Spiriva, one puff a day, #30 and 3 refills. Okay to come pick up some samples, please demonstrate Spiriva use to the patient Followup with Dr Alwyn Ren as recommended.

## 2012-08-22 ENCOUNTER — Other Ambulatory Visit: Payer: Self-pay | Admitting: *Deleted

## 2012-08-22 MED ORDER — METOPROLOL SUCCINATE ER 50 MG PO TB24: ORAL_TABLET | ORAL | Status: DC

## 2012-08-22 NOTE — Telephone Encounter (Signed)
Called pt, unable to leave a msg.   

## 2012-08-24 ENCOUNTER — Encounter: Payer: Self-pay | Admitting: Internal Medicine

## 2012-08-24 ENCOUNTER — Ambulatory Visit (INDEPENDENT_AMBULATORY_CARE_PROVIDER_SITE_OTHER): Payer: Medicare Other | Admitting: Internal Medicine

## 2012-08-24 VITALS — BP 104/62 | HR 63 | Temp 97.9°F | Wt 177.6 lb

## 2012-08-24 DIAGNOSIS — J449 Chronic obstructive pulmonary disease, unspecified: Secondary | ICD-10-CM

## 2012-08-24 NOTE — Progress Notes (Signed)
  Subjective:    Patient ID: Daniel Ali, male    DOB: 02-Jun-1925, 78 y.o.   MRN: [id number]  HPI He had not been using the Combivent Respimat; as the directions were difficult to understand. He has not been using his rescue albuterol. He is using Symbicort 80/12.5 one puff once a day.  Spirometry revealed moderate obstructive lung disease without significant bronchodilator response. Residual volume was increased and diffusing capacity was  Not reduced.   Review of Systems His shortness of breath is stable at this time. He denies significant sputum production. He is not having fever, chills, or sweats.     Objective:   Physical Exam General appearance is one of good health and nourishment w/o distress.Appears younger than stated age   Eyes: No conjunctival inflammation .    Heart:  Normal rate and regular rhythm. S1 and S2 normal without gallop, murmur, click, rub or other extra sounds  . Heart sounds distant   Lungs:Chest clear to auscultation; no wheezes, rhonchi,rales ,or rubs present.No increased work of breathing. Barrel chested    Skin:Warm & dry.  Intact without suspicious lesions or rashes   Lymphatic: No lymphadenopathy is noted about the head, neck, axilla  1/2+ pedal edema. No clubbing or cyanosis present             Assessment & Plan:  #1 moderate obstructive lung disease with air trapping. Surprisingly diffusing capacity is well retained  Plan: Combivent Respimat 1-2 puffs every 6 hours as needed will be mainstay of therapy

## 2012-08-24 NOTE — Patient Instructions (Addendum)
Combivent Respimat 1-2 puffs every 6 hours as needed. Symbicort 80/4.5  1-2 puffs every 12 hours as needed. Gargle and spit after using Symbicort.  Fluticasone 1 spray in each nostril twice a day as needed. Use the "crossover" technique as discussed

## 2012-08-24 NOTE — Telephone Encounter (Signed)
Called pt, unable to leave a msg. Pt has an appt today with Dr. Alwyn Ren.

## 2012-08-28 ENCOUNTER — Other Ambulatory Visit: Payer: Self-pay | Admitting: Cardiology

## 2012-09-05 ENCOUNTER — Telehealth: Payer: Self-pay | Admitting: Physician Assistant

## 2012-09-05 ENCOUNTER — Other Ambulatory Visit: Payer: Self-pay | Admitting: Physician Assistant

## 2012-09-05 NOTE — Telephone Encounter (Signed)
Erin with [facility] was calling to confirm OK to refill patient's medicines. It appears he recently had these called in on 08/22/12 (Toprol 50mg  1/2 tab BID and Imdur) but they did not receive this rx. Previous notes reviewed. He was previously on Toprol 25mg  daily but this was increased per pharmacy records to Toprol 50mg  po 1/2 tablet BID on 06/21/12 which the patient has been stable on. He has also been taking Imdur 30mg  po daily. D/w Dr. Riley Kill - he would like to continue the regimen that the patient had been taking at home. Refilled both for #30 with 6 refills verbally, read back by pharmacist. Patient also notified. Ronie Spies PA--C

## 2012-09-05 NOTE — Telephone Encounter (Signed)
Received call from [place] at [facility]. Patient awaiting approval of refills of Toprol and Imdur. OV note 06/18/2012 showed Toprol 25mg  daily. The patient had Toprol 50mg  1/2 tablet BID filled 06/21/12 as well as Imdur 30mg  daily and has been taking this as prescribed. This was also phoned in 08/22/12 but [facility] does not have record of this. D/w Dr. Riley Kill given dose variation - since patient stable on current dose will continue current regimen. Refills called in for both with 30 tablets, 6 refills verbally to pharmacist. Pt also informed. Dayna Dunn PA-C

## 2012-09-24 NOTE — Telephone Encounter (Signed)
I spoke with the pt about this issue at his 06/18/12 office visit.

## 2012-09-28 ENCOUNTER — Ambulatory Visit (INDEPENDENT_AMBULATORY_CARE_PROVIDER_SITE_OTHER): Payer: Medicare Other | Admitting: Ophthalmology

## 2012-10-31 ENCOUNTER — Telehealth: Payer: Self-pay | Admitting: *Deleted

## 2012-10-31 NOTE — Telephone Encounter (Signed)
To Whom It May Concern:  Is medically indicated that Mr.  Daniel Ali , date of birth Sep 04, 1925, continue in the Silver Sneakers exercise program. This has been invaluable in reference to his documented coronary disease and COPD.  The cardiac literature has documented that regular excise program is preventive in this context.                                                                                                                                      Respectfully,

## 2012-10-31 NOTE — Telephone Encounter (Signed)
Pt states that he needs a letter stating that he needs to be enrolled in the silver sneaker through [employer] care. Pt notes that this was done last year as well. Pt would like to get a copy of letter mailed to him as well. Please advise  Mail to:[place] healthcare claim [address] [place], [place] [address]

## 2012-11-01 NOTE — Telephone Encounter (Signed)
Left message on VM informing patient letter mailed to [facility] and copy sent to [facility]

## 2012-12-08 ENCOUNTER — Telehealth: Payer: Self-pay | Admitting: Adult Health

## 2012-12-08 NOTE — Telephone Encounter (Signed)
Patient has been having some dizziness. Saw [facility] at [facility] location and was told to decrease isosorbide to 1/2 tablet daily from 1/2 tablet BID. He asks if this is okay with cardiology to due so.  I have told him that he can cut back to once a day to ascertain if this is helpful to his dizziness. He is due to see Dr. Riley Kill in one month. If symptoms persist, he is to make appointment for sooner.

## 2012-12-15 NOTE — Telephone Encounter (Signed)
Spoke with patient by phone.  He is going through a lot, maybe feeling better.  He has three family members now with cancer, including and up to his schizophrenic son.  A lot of stress.  He has made changes in his medication, so it is ok.  He will see me later this month.  TS

## 2013-01-04 ENCOUNTER — Telehealth: Payer: Self-pay | Admitting: Cardiology

## 2013-01-04 NOTE — Telephone Encounter (Signed)
Walk In pt Form " pt has Questions Concerning son" Sent to lauren 01/04/13/KM

## 2013-05-06 ENCOUNTER — Telehealth: Payer: Self-pay | Admitting: *Deleted

## 2013-05-06 NOTE — Telephone Encounter (Signed)
NO NOTE AT THIS TIME

## 2013-06-06 ENCOUNTER — Ambulatory Visit: Payer: Medicare Other | Admitting: Family Medicine

## 2013-06-06 DIAGNOSIS — Z0289 Encounter for other administrative examinations: Secondary | ICD-10-CM

## 2013-07-10 ENCOUNTER — Other Ambulatory Visit: Payer: Self-pay | Admitting: Physician Assistant

## 2013-07-30 ENCOUNTER — Other Ambulatory Visit: Payer: Self-pay

## 2013-07-30 MED ORDER — ISOSORBIDE MONONITRATE ER 30 MG PO TB24: 30.0000 mg | ORAL_TABLET | Freq: Every day | ORAL | Status: DC

## 2013-08-01 ENCOUNTER — Other Ambulatory Visit: Payer: Self-pay | Admitting: *Deleted

## 2013-08-01 MED ORDER — ISOSORBIDE MONONITRATE ER 30 MG PO TB24: 30.0000 mg | ORAL_TABLET | Freq: Every day | ORAL | Status: DC

## 2013-08-21 ENCOUNTER — Other Ambulatory Visit: Payer: Self-pay | Admitting: Physician Assistant

## 2013-08-27 ENCOUNTER — Encounter: Payer: Self-pay | Admitting: Family Medicine

## 2013-08-27 ENCOUNTER — Ambulatory Visit (INDEPENDENT_AMBULATORY_CARE_PROVIDER_SITE_OTHER): Payer: Medicare Other | Admitting: Family Medicine

## 2013-08-27 VITALS — BP 140/72 | HR 74 | Temp 98.3°F | Wt 177.4 lb

## 2013-08-27 DIAGNOSIS — J441 Chronic obstructive pulmonary disease with (acute) exacerbation: Secondary | ICD-10-CM

## 2013-08-27 DIAGNOSIS — J40 Bronchitis, not specified as acute or chronic: Secondary | ICD-10-CM

## 2013-08-27 MED ORDER — AZITHROMYCIN 250 MG PO TABS
ORAL_TABLET | ORAL | Status: DC
Start: 2013-08-27 — End: 2013-12-04

## 2013-08-27 MED ORDER — METHYLPREDNISOLONE ACETATE 80 MG/ML IJ SUSP
80.0000 mg | Freq: Once | INTRAMUSCULAR | Status: AC
  Administered 2013-08-27: 80 mg via INTRAMUSCULAR

## 2013-08-27 MED ORDER — IPRATROPIUM-ALBUTEROL 0.5-2.5 (3) MG/3ML IN SOLN
3.0000 mL | Freq: Once | RESPIRATORY_TRACT | Status: AC
  Administered 2013-08-27: 3 mL via RESPIRATORY_TRACT

## 2013-08-27 MED ORDER — PREDNISONE 10 MG PO TABS: ORAL_TABLET | ORAL | Status: DC

## 2013-08-27 MED ORDER — ALBUTEROL SULFATE (2.5 MG/3ML) 0.083% IN NEBU: 2.5000 mg | INHALATION_SOLUTION | Freq: Four times a day (QID) | RESPIRATORY_TRACT | Status: DC | PRN

## 2013-08-27 MED ORDER — FORMOTEROL FUMARATE 20 MCG/2ML IN NEBU: 20.0000 ug | INHALATION_SOLUTION | Freq: Two times a day (BID) | RESPIRATORY_TRACT | Status: DC

## 2013-08-27 NOTE — Patient Instructions (Addendum)
  F/u 2 weeks or sooner prn with hopper  Bronchitis Bronchitis is the body's way of reacting to injury and/or infection (inflammation) of the bronchi. Bronchi are the air tubes that extend from the windpipe into the lungs. If the inflammation becomes severe, it may cause shortness of breath. CAUSES  Inflammation may be caused by:  A virus.  Germs (bacteria).  Dust.  Allergens.  Pollutants and many other irritants. The cells lining the bronchial tree are covered with tiny hairs (cilia). These constantly beat upward, away from the lungs, toward the mouth. This keeps the lungs free of pollutants. When these cells become too irritated and are unable to do their job, mucus begins to develop. This causes the characteristic cough of bronchitis. The cough clears the lungs when the cilia are unable to do their job. Without either of these protective mechanisms, the mucus would settle in the lungs. Then you would develop pneumonia. Smoking is a common cause of bronchitis and can contribute to pneumonia. Stopping this habit is the single most important thing you can do to help yourself. TREATMENT   Your caregiver may prescribe an antibiotic if the cough is caused by bacteria. Also, medicines that open up your airways make it easier to breathe. Your caregiver may also recommend or prescribe an expectorant. It will loosen the mucus to be coughed up. Only take over-the-counter or prescription medicines for pain, discomfort, or fever as directed by your caregiver.  Removing whatever causes the problem (smoking, for example) is critical to preventing the problem from getting worse.  Cough suppressants may be prescribed for relief of cough symptoms.  Inhaled medicines may be prescribed to help with symptoms now and to help prevent problems from returning.  For those with recurrent (chronic) bronchitis, there may be a need for steroid medicines. SEEK IMMEDIATE MEDICAL CARE IF:   During treatment, you  develop more pus-like mucus (purulent sputum).  You have a fever.  You become progressively more ill.  You have increased difficulty breathing, wheezing, or shortness of breath. It is necessary to seek immediate medical care if you are elderly or sick from any other disease. MAKE SURE YOU:   Understand these instructions.  Will watch your condition.  Will get help right away if you are not doing well or get worse. Document Released: 09/26/2005 Document Revised: 05/29/2013 Document Reviewed: 05/21/2013 [facility] Patient Information 2014 [place], [place].

## 2013-08-27 NOTE — Progress Notes (Signed)
  Subjective:     Daniel Ali is a 79 y.o. male who presents for evaluation of symptoms of a URI. Symptoms include facial pain, nasal congestion, shortness of breath and wheezing. Onset of symptoms was 1 month ago, and has been gradually worsening since that time. Treatment to date: none.  The following portions of the patient's history were reviewed and updated as appropriate: allergies, current medications, past family history, past medical history, past social history, past surgical history and problem list.  Review of Systems Pertinent items are noted in HPI.   Objective:    BP 140/72  Pulse 74  Temp(Src) 98.3 F (36.8 C) (Oral)  Wt 177 lb 6.4 oz (80.468 kg)  SpO2 94% General appearance: alert, cooperative, appears stated age and mild distress Ears: normal TM's and external ear canals both ears Nose: green discharge, moderate congestion, turbinates red, swollen, sinus tenderness bilateral Throat: lips, mucosa, and tongue normal; teeth and gums normal Neck: mild anterior cervical adenopathy, supple, symmetrical, trachea midline and thyroid not enlarged, symmetric, no tenderness/mass/nodules Lungs: rhonchi bilaterally and wheezes bilaterally   Assessment:    bronchitis and copd exacerbation   Plan:    Nasal saline spray for congestion. Zithromax per orders. Nasal steroids per orders. Follow up as needed. Follow up in 2 weeks or as needed.  Neb to be delivered---perfomist and albuterol

## 2013-08-27 NOTE — Progress Notes (Signed)
Pre visit review using our clinic review tool, if applicable. No additional management support is needed unless otherwise documented below in the visit note. 

## 2013-09-03 ENCOUNTER — Other Ambulatory Visit: Payer: Self-pay

## 2013-09-03 MED ORDER — ISOSORBIDE MONONITRATE ER 30 MG PO TB24: 30.0000 mg | ORAL_TABLET | Freq: Every day | ORAL | Status: DC

## 2013-09-25 ENCOUNTER — Encounter: Payer: Self-pay | Admitting: Cardiovascular Disease

## 2013-09-25 ENCOUNTER — Ambulatory Visit (INDEPENDENT_AMBULATORY_CARE_PROVIDER_SITE_OTHER): Payer: Medicare Other | Admitting: Cardiovascular Disease

## 2013-09-25 VITALS — BP 108/58 | HR 70 | Ht 67.0 in | Wt 177.0 lb

## 2013-09-25 DIAGNOSIS — E7800 Pure hypercholesterolemia, unspecified: Secondary | ICD-10-CM

## 2013-09-25 DIAGNOSIS — I2510 Atherosclerotic heart disease of native coronary artery without angina pectoris: Secondary | ICD-10-CM

## 2013-09-25 DIAGNOSIS — I10 Essential (primary) hypertension: Secondary | ICD-10-CM

## 2013-09-25 NOTE — Progress Notes (Signed)
History of Present Illness: 79 yo male with history of CAD, HTN, HLD here today for cardiac follow up. He has been followed by Dr. Riley Kill. He has several stents in the LAD. Last cath 2005 per Dr. Riley Kill with placement of Taxus DES in the mid and distal LAD. No cath since 2005. He swims 2 days per week at the [facility] and walks often.   He is here today for follow up. He has no exertional chest pain or SOB.   Primary Care Physician: Marga Melnick  Last Lipid Profile:Lipid Panel     Component Value Date/Time   CHOL 143 06/27/2011 0835   TRIG 70.0 06/27/2011 0835   HDL 43.20 06/27/2011 0835   CHOLHDL 3 06/27/2011 0835   VLDL 14.0 06/27/2011 0835   LDLCALC 86 06/27/2011 0835     Past Medical History  Diagnosis Date  . Loss of height   . Compression fracture   . Hypertension   . Hypercholesterolemia   . Rhinitis   . Dyspnea   . Weight loss   . CAD (coronary artery disease)   . Peptic ulcer disease   . Alcohol abuse   . AMI (acute mesenteric ischemia)   . Colon polyps   . Pleurisy with effusion   . Grief reaction   . Macular degeneration     Past Surgical History  Procedure Laterality Date  . Exploratory laparotomy  1978    due to kidney problems  . Thoracotomy  1982    hemothorax  . Back surgery  2003    Cervical Spondylosis  . Spine surgery  2003    C-spine  . Cardiac catheterization  06/21/03  . Coronary stent placement  07/2003    x3  . Coronary angioplasty with stent placement  05/2004  . Colonoscopy w/ polypectomy  11/2006    Current Outpatient Prescriptions  Medication Sig Dispense Refill  . albuterol (PROVENTIL HFA;VENTOLIN HFA) 108 (90 BASE) MCG/ACT inhaler Inhale 2 puffs into the lungs every 4 (four) hours as needed.      Marland Kitchen albuterol (PROVENTIL) (2.5 MG/3ML) 0.083% nebulizer solution Take 3 mLs (2.5 mg total) by nebulization every 6 (six) hours as needed for wheezing or shortness of breath.  150 mL  1  . albuterol-ipratropium (COMBIVENT) 18-103 MCG/ACT  inhaler Inhale 2 puffs into the lungs every 6 (six) hours as needed.  1 Inhaler  6  . Artificial Tear Solution (TEARS NATURALE OP) Apply to eye daily.      Marland Kitchen aspirin 81 MG tablet Take 81 mg by mouth daily.        Marland Kitchen azithromycin (ZITHROMAX Z-PAK) 250 MG tablet As directed  6 each  0  . budesonide-formoterol (SYMBICORT) 80-4.5 MCG/ACT inhaler Inhale 2 puffs into the lungs 2 (two) times daily.      . Cholecalciferol (VITAMIN D-3) 5000 UNITS TABS Take by mouth daily.        . Coenzyme Q10 (COQ10) 100 MG CAPS Take by mouth daily.        . fluticasone (FLONASE) 50 MCG/ACT nasal spray Place 2 sprays into the nose daily.  16 g  6  . formoterol (PERFOROMIST) 20 MCG/2ML nebulizer solution Take 2 mLs (20 mcg total) by nebulization 2 (two) times daily.      Marland Kitchen guaifenesin (MUCUS RELIEF) 400 MG TABS Take 400 mg by mouth as needed.        . isosorbide mononitrate (IMDUR) 30 MG 24 hr tablet Take 1 tablet (30 mg total) by  mouth daily.  30 tablet  0  . LUTEIN PO Take by mouth 2 (two) times daily.      . metoprolol succinate (TOPROL-XL) 50 MG 24 hr tablet take 1/2 tablet by mouth twice a day  30 tablet  0  . Multiple Minerals-Vitamins (CALCIUM & VIT D3 BONE HEALTH PO) Take by mouth daily.       . Multiple Vitamins-Minerals (ANTIOXIDANT) TABS Take by mouth daily.        . nitroGLYCERIN (NITROSTAT) 0.4 MG SL tablet Place 0.4 mg under the tongue every 5 (five) minutes as needed.        . Omega-3 Fatty Acids (FISH OIL) 1000 MG CAPS Take by mouth daily.      . predniSONE (DELTASONE) 10 MG tablet 3 po qd for 3 days then 2 po qd for 3 days the 1 po qd for 3 days  18 tablet  0   No current facility-administered medications for this visit.    Allergies  Allergen Reactions  . Cefprozil     REACTION: insomnia  . Simvastatin     REACTION: MUSCLE ACHES    History   Social History  . Marital Status: Divorced    Spouse Name: N/A    Number of Children: 4  . Years of Education: N/A   Occupational History  .  Retired    Social History Main Topics  . Smoking status: Former Smoker    Quit date: 10/10/1946  . Smokeless tobacco: Never Used     Comment: cigars during college  . Alcohol Use: No     Comment: recovered ETOH abuser  . Drug Use: No  . Sexual Activity: Not Currently   Other Topics Concern  . Not on file   Social History Narrative   Lives by him self , able to do all ADL    Family History  Problem Relation Age of Onset  . Diabetes Father   . Colon cancer Mother     Review of Systems:  As stated in the HPI and otherwise negative.   BP 108/58  Pulse 70  Ht 5\' 7"  (1.702 m)  Wt 177 lb (80.287 kg)  BMI 27.72 kg/m2  Physical Examination: General: Well developed, well nourished, NAD HEENT: OP clear, mucus membranes moist SKIN: warm, dry. No rashes. Neuro: No focal deficits Musculoskeletal: Muscle strength 5/5 all ext Psychiatric: Mood and affect normal Neck: No JVD, no carotid bruits, no thyromegaly, no lymphadenopathy. Lungs:Clear bilaterally, no wheezes, rhonci, crackles Cardiovascular: Regular rate and rhythm. No murmurs, gallops or rubs. Abdomen:Soft. Bowel sounds present. Non-tender.  Extremities: No lower extremity edema. Pulses are 2 + in the bilateral DP/PT.  EKG: NSR, rate 70 bpm.   Cardiac cath 2005: . Ventriculography was performed in the RAO projection. Overall systolic  function appeared to be well preserved and ejection fraction is excess of  60%.  2. Hand injection into the aortic root did not suggest aortic dissection nor  was there any evidence of aortic regurgitation.  3. The left main was free of critical disease.  4. The left anterior descending artery coursed to the apex. There was  minimal luminal irregularity inside the previously placed stent with no  evidence of significant restenosis. At the bifurcation point in the mid  vessel at the small diagonal takeoff there is about a 75% focal stenosis  which is more demonstrable after the wire was  placed across. Following  stenting, this was reduced to less than 10% residual luminal narrowing.  There was second lesion of about 60-70% distally which was stented also  and resulted in a 0% residual stenosis. The small diagonal branch at the  beginning of the procedure did not have significant narrowing and was  pinched to probably 50-60% at the completion of the procedure, but it is  a small branch and likely to be relieved ultimately with nitroglycerin.  5. The circumflex provides a large marginal branch and a smaller AV  circumflex. Both of these are without critical narrowing.  6. The right coronary artery is a dominant vessel with posterior descending  and posterior lateral system also without significant narrowing.   Assessment and Plan:   1. CAD: Stable. Will continue ASA and beta blocker. Now on 12.5 mg Toprol BID. He does not tolerate statins.   2. HTN: BP well controlled. No changes.   3. Hyperlipidemia: Lipids well controlled. He does not tolerate statins.   Extensive review of records today including primary care notes, cath reports, EKG.

## 2013-09-25 NOTE — Patient Instructions (Signed)
Your physician wants you to follow-up in:  12 months.  You will receive a reminder letter in the mail two months in advance. If you don't receive a letter, please call our office to schedule the follow-up appointment.   

## 2013-11-01 ENCOUNTER — Other Ambulatory Visit: Payer: Self-pay | Admitting: *Deleted

## 2013-11-01 MED ORDER — METOPROLOL SUCCINATE ER 50 MG PO TB24: ORAL_TABLET | ORAL | Status: DC

## 2013-11-01 MED ORDER — ISOSORBIDE MONONITRATE ER 30 MG PO TB24: 30.0000 mg | ORAL_TABLET | Freq: Every day | ORAL | Status: DC

## 2013-12-04 ENCOUNTER — Telehealth: Payer: Self-pay | Admitting: *Deleted

## 2013-12-04 ENCOUNTER — Ambulatory Visit (INDEPENDENT_AMBULATORY_CARE_PROVIDER_SITE_OTHER): Payer: Medicare Other | Admitting: Family Medicine

## 2013-12-04 VITALS — BP 124/70 | HR 73 | Temp 98.1°F | Resp 16 | Ht 64.5 in | Wt 177.0 lb

## 2013-12-04 DIAGNOSIS — F4320 Adjustment disorder, unspecified: Secondary | ICD-10-CM

## 2013-12-04 DIAGNOSIS — J029 Acute pharyngitis, unspecified: Secondary | ICD-10-CM

## 2013-12-04 DIAGNOSIS — R42 Dizziness and giddiness: Secondary | ICD-10-CM

## 2013-12-04 NOTE — Telephone Encounter (Signed)
Spoke with the pt and he stated that he has been having some side effect with the Proventil inhaler,which he started using 2-3 days ago.  While talking with the pt phone was disconnected.  Tried to call the pt back (x2) but unable to reach him.  Called the pt back again and he stated that he is at [facility] been seen.  He said he going to see if they can help him.  Informed the pt to let us know if he needs [place].  He stated okay.//AB/CMA

## 2013-12-04 NOTE — Patient Instructions (Signed)
Thank you for coming in today  1. Use the spacer chamber with your inhalers 2. Take 1 puff of inhaler at a time. Hold it for 10 seconds and wait 1 minute between puffs. 3. Call a counselor to discuss your loss of loved ones 4. Go to ER if you have any recurrent dizziness, severe headache, speech difficulties, weakness or numbness on one side of body.  Here are some counselors  [facility] [address] [place], [place] [address] [phone] or [facility] [place] [phone] or [place] [address] [place], [place] [address] [phone]

## 2013-12-04 NOTE — Progress Notes (Signed)
   Subjective:    Patient ID: Daniel Ali, male    DOB: 11-12-1924, 80 y.o.   MRN: [id number]  HPI Patient is an 80 y/o male with multiple medical problems who presents with questions regarding medications. Woke up feeling dizzy and "upset". He has been under a lot of stress lately; he has lost several family members to cancer in the last couple months and has been in charge of estates. Lives independently and has good relationships with neighbors. Going to church and [employer]. Not sure that he is coping well. Dizziness has now completely resolved. Was feeling off balance, but now resolved. No headache, blurry vision, weakness or numbness or tingling, speech problems. No history of stroke but does have 3 stents.   Had some trouble last night with albuterol inhaler. Has COPD and went to use albuterol and thinks he got too much liquid in his throat. Now this feeling is also resolved. Not using spacer and took both puffs at one time.  Review of Systems  All other systems reviewed and are negative.       Objective:   Physical Exam  Constitutional: He is oriented to person, place, and time. He appears well-developed and well-nourished. No distress.  HENT:  Head: Normocephalic and atraumatic.  Mouth/Throat: Oropharynx is clear and moist. No oropharyngeal exudate.  Eyes: Conjunctivae are normal. Pupils are equal, round, and reactive to light. No scleral icterus.  Neck: Normal range of motion. Neck supple.  Cardiovascular: Normal rate, regular rhythm and normal heart sounds.   No murmur heard. Pulmonary/Chest: Effort normal and breath sounds normal. No respiratory distress. He has no wheezes.  Musculoskeletal: Normal range of motion.  Lymphadenopathy:    He has no cervical adenopathy.  Neurological: He is alert and oriented to person, place, and time. He has normal strength. He is not disoriented. No cranial nerve deficit or sensory deficit. He displays a negative Romberg sign.  Coordination and gait normal.  Skin: Skin is warm and dry. He is not diaphoretic.  Psychiatric: He has a normal mood and affect. His behavior is normal.      Assessment & Plan:  #1. Dizziness - now resolved - low suspicion for TIA or stroke as no other symptoms - suspect stress reaction - return precautions given  #2. Throat discomfort - Use spacer with inhaler - Take 1 puff at a time, wait 1 min between puffs  #3. Adjustment reaction - Refer to counseling - Denies HI/SI

## 2013-12-04 NOTE — Telephone Encounter (Signed)
Patient called and stated that he is having "a hard time" with his Proventil inhaler. He would like a call back. JG//CMA

## 2013-12-09 ENCOUNTER — Ambulatory Visit (INDEPENDENT_AMBULATORY_CARE_PROVIDER_SITE_OTHER): Payer: Medicare Other | Admitting: Physician Assistant

## 2013-12-09 ENCOUNTER — Encounter: Payer: Self-pay | Admitting: Physician Assistant

## 2013-12-09 VITALS — BP 120/60 | HR 68 | Ht 64.5 in | Wt 176.0 lb

## 2013-12-09 DIAGNOSIS — E785 Hyperlipidemia, unspecified: Secondary | ICD-10-CM

## 2013-12-09 DIAGNOSIS — I10 Essential (primary) hypertension: Secondary | ICD-10-CM

## 2013-12-09 DIAGNOSIS — J449 Chronic obstructive pulmonary disease, unspecified: Secondary | ICD-10-CM

## 2013-12-09 DIAGNOSIS — I2510 Atherosclerotic heart disease of native coronary artery without angina pectoris: Secondary | ICD-10-CM

## 2013-12-09 DIAGNOSIS — R609 Edema, unspecified: Secondary | ICD-10-CM

## 2013-12-09 DIAGNOSIS — R0602 Shortness of breath: Secondary | ICD-10-CM

## 2013-12-09 NOTE — Patient Instructions (Signed)
Your physician has requested that you have a lexiscan myoview. For further information please visit [email or web address]. Please follow instruction sheet, as given.  Your physician recommends that you continue on your current medications as directed. Please refer to the Current Medication list given to you today.  Your physician wants you to follow-up in: 6 month ov with Dr Shirley Friar will receive a reminder letter in the mail two months in advance. If you don't receive a letter, please call our office to schedule the follow-up appointment.

## 2013-12-09 NOTE — Progress Notes (Signed)
[address], [place], [place]  [address] Phone: [phone] Fax:  [phone]  Date:  12/09/2013   ID:  Daniel Ali, DOB 06-Sep-1925, MRN [id number]  PCP:  Unice Cobble, MD  Cardiologist:  Dr. Lauree Chandler     History of Present Illness: Daniel Ali is a 80 y.o. male with a history of CAD, prior PCI to the LAD, HTN, HL, COPD. He is intolerant to statins.  He was previously followed by Dr. Lia Foyer. He established with Dr. Angelena Form in 09/2013.    LHC (05/2004):  EF 60%; mid LAD 75%, distal LAD 60-70%. PCI: Taxus (12 x 2.75 mm) DES to the mid LAD; Taxus (12 x 2.75 mm) DES to the distal LAD.  Dx "pinched" to 50-60% after PCI. Echo (03/2010): Technically limited; LV function may be good; PASP 39.  He was recently seen by his doctor at the [facility] and told to follow up to "get his heart checked out."  He notes increased DOE over the last few months.  He is probably NYHA Class 2b.  He denies chest pain. Of note, he still swims at the [facility] 2-3 x a week.  He feels ok with this.  Denies any chest pain, syncope, orthopnea, PND.  He has some mild ankle edema that is chronic.    Recent Labs: No results found for requested labs within last 365 days.   Wt Readings from Last 3 Encounters:  12/09/13 176 lb (79.833 kg)  12/04/13 177 lb (80.287 kg)  09/25/13 177 lb (80.287 kg)     Past Medical History  Diagnosis Date  . Loss of height   . Compression fracture   . Hypertension   . Hypercholesterolemia   . Rhinitis   . Dyspnea   . Weight loss   . CAD (coronary artery disease)   . Peptic ulcer disease   . Alcohol abuse   . AMI (acute mesenteric ischemia)   . Colon polyps   . Pleurisy with effusion   . Grief reaction   . Macular degeneration     Current Outpatient Prescriptions  Medication Sig Dispense Refill  . albuterol (PROVENTIL HFA;VENTOLIN HFA) 108 (90 BASE) MCG/ACT inhaler Inhale 2 puffs into the lungs every 4 (four) hours as needed.      Marland Kitchen albuterol  (PROVENTIL) (2.5 MG/3ML) 0.083% nebulizer solution Take 3 mLs (2.5 mg total) by nebulization every 6 (six) hours as needed for wheezing or shortness of breath.  150 mL  1  . albuterol-ipratropium (COMBIVENT) 18-103 MCG/ACT inhaler Inhale 2 puffs into the lungs every 6 (six) hours as needed.  1 Inhaler  6  . Artificial Tear Solution (TEARS NATURALE OP) Apply to eye daily.      Marland Kitchen aspirin 81 MG tablet Take 81 mg by mouth daily.        . budesonide-formoterol (SYMBICORT) 80-4.5 MCG/ACT inhaler Inhale 2 puffs into the lungs 2 (two) times daily.      . Cholecalciferol (VITAMIN D-3) 5000 UNITS TABS Take by mouth daily.        . Coenzyme Q10 (COQ10) 100 MG CAPS Take by mouth daily.        . fluticasone (FLONASE) 50 MCG/ACT nasal spray Place 2 sprays into the nose daily.  16 g  6  . formoterol (PERFOROMIST) 20 MCG/2ML nebulizer solution Take 2 mLs (20 mcg total) by nebulization 2 (two) times daily.      . isosorbide mononitrate (IMDUR) 30 MG 24 hr tablet Take 1 tablet (  30 mg total) by mouth daily.  30 tablet  5  . LUTEIN PO Take by mouth 2 (two) times daily.      . metoprolol succinate (TOPROL-XL) 50 MG 24 hr tablet take 1/4 tablet by mouth twice a day      . Multiple Minerals-Vitamins (CALCIUM & VIT D3 BONE HEALTH PO) Take by mouth daily.       . Multiple Vitamins-Minerals (ANTIOXIDANT) TABS Take by mouth daily.        . nitroGLYCERIN (NITROSTAT) 0.4 MG SL tablet Place 0.4 mg under the tongue every 5 (five) minutes as needed.        . Omega-3 Fatty Acids (FISH OIL) 1000 MG CAPS Take by mouth daily.       No current facility-administered medications for this visit.    Allergies:   Cefprozil and Simvastatin   Social History:  The patient  reports that he quit smoking about 67 years ago. He has never used smokeless tobacco. He reports that he does not drink alcohol or use illicit drugs.   Family History:  The patient's family history includes Colon cancer in his mother; Diabetes in his father.   ROS:   Please see the history of present illness.   He has frequent nocturia.  He has a chronic cough.   He has had several acquaintances pass away recently including his son.  He is grieving.  All other systems reviewed and negative.   PHYSICAL EXAM: VS:  BP 120/60  Pulse 68  Ht 5' 4.5" (1.638 m)  Wt 176 lb (79.833 kg)  BMI 29.75 kg/m2 Well nourished, well developed, in no acute distress HEENT: normal Neck: no JVD Cardiac:  normal S1, S2; RRR; no murmur Lungs:  Decreased breath sounds bilaterally, no wheezing, rhonchi or rales Abd: soft, nontender, no hepatomegaly Ext: trace to 1+ bilateral ankle edema Skin: warm and dry Neuro:  CNs 2-12 intact, no focal abnormalities noted  EKG:  NSR, HR 68, normal axis, no ST changes     ASSESSMENT AND PLAN:  1. Dyspnea:  I suspect this is all related to COPD. There seems to have been a change recently.  I felt an ETT would be adequate to screen for ischemia.  However, he can not walk on a treadmill.  Arrange Lexiscan Myoview.   2. COPD:  F/u with doctors at the [facility]. 3. CAD:  Continue ASA, beta blocker, nitrates.  Proceed with Lexiscan Myoview as noted.  4. Hypertension:  Controlled.  5. Hyperlipidemia:  He is intol to statins.  6. Grief Reaction:  F/u with primary care.  7. Disposition:  F/u with Dr. Lauree Chandler in 6 mos or sooner if myoview is abnormal.    Signed, Richardson Dopp, PA-C  12/09/2013 2:42 PM

## 2013-12-24 ENCOUNTER — Encounter (HOSPITAL_COMMUNITY): Payer: Medicare Other

## 2014-01-21 ENCOUNTER — Encounter: Payer: Medicare Other | Admitting: Physician Assistant

## 2014-01-22 ENCOUNTER — Encounter: Payer: Self-pay | Admitting: Physician Assistant

## 2014-02-12 ENCOUNTER — Ambulatory Visit (INDEPENDENT_AMBULATORY_CARE_PROVIDER_SITE_OTHER): Payer: Medicare Other | Admitting: Family Medicine

## 2014-02-12 ENCOUNTER — Other Ambulatory Visit (INDEPENDENT_AMBULATORY_CARE_PROVIDER_SITE_OTHER): Payer: Medicare Other

## 2014-02-12 ENCOUNTER — Encounter: Payer: Self-pay | Admitting: Internal Medicine

## 2014-02-12 ENCOUNTER — Ambulatory Visit (INDEPENDENT_AMBULATORY_CARE_PROVIDER_SITE_OTHER): Payer: Medicare Other | Admitting: Internal Medicine

## 2014-02-12 VITALS — BP 120/70 | HR 66

## 2014-02-12 VITALS — BP 120/70 | HR 66 | Temp 98.4°F | Wt 169.5 lb

## 2014-02-12 DIAGNOSIS — S4980XA Other specified injuries of shoulder and upper arm, unspecified arm, initial encounter: Secondary | ICD-10-CM

## 2014-02-12 DIAGNOSIS — S46909A Unspecified injury of unspecified muscle, fascia and tendon at shoulder and upper arm level, unspecified arm, initial encounter: Secondary | ICD-10-CM

## 2014-02-12 DIAGNOSIS — I10 Essential (primary) hypertension: Secondary | ICD-10-CM

## 2014-02-12 DIAGNOSIS — W1800XA Striking against unspecified object with subsequent fall, initial encounter: Secondary | ICD-10-CM

## 2014-02-12 DIAGNOSIS — M25512 Pain in left shoulder: Secondary | ICD-10-CM

## 2014-02-12 DIAGNOSIS — M25519 Pain in unspecified shoulder: Secondary | ICD-10-CM

## 2014-02-12 DIAGNOSIS — S46009A Unspecified injury of muscle(s) and tendon(s) of the rotator cuff of unspecified shoulder, initial encounter: Secondary | ICD-10-CM

## 2014-02-12 DIAGNOSIS — W1809XA Striking against other object with subsequent fall, initial encounter: Secondary | ICD-10-CM

## 2014-02-12 DIAGNOSIS — M25511 Pain in right shoulder: Secondary | ICD-10-CM

## 2014-02-12 LAB — URINALYSIS, ROUTINE W REFLEX MICROSCOPIC
Hgb urine dipstick: NEGATIVE
NITRITE: NEGATIVE
PH: 6 (ref 5.0–8.0)
RBC / HPF: NONE SEEN (ref 0–?)
SPECIFIC GRAVITY, URINE: 1.025 (ref 1.000–1.030)
UROBILINOGEN UA: 0.2 (ref 0.0–1.0)
Urine Glucose: NEGATIVE

## 2014-02-12 LAB — CBC WITH DIFFERENTIAL/PLATELET
Basophils Absolute: 0.2 10*3/uL — ABNORMAL HIGH (ref 0.0–0.1)
Basophils Relative: 2.1 % (ref 0.0–3.0)
EOS ABS: 0.3 10*3/uL (ref 0.0–0.7)
Eosinophils Relative: 2.8 % (ref 0.0–5.0)
HCT: 36.1 % — ABNORMAL LOW (ref 39.0–52.0)
Hemoglobin: 11.7 g/dL — ABNORMAL LOW (ref 13.0–17.0)
LYMPHS PCT: 17.3 % (ref 12.0–46.0)
Lymphs Abs: 2 10*3/uL (ref 0.7–4.0)
MCHC: 32.4 g/dL (ref 30.0–36.0)
MCV: 88.9 fl (ref 78.0–100.0)
MONOS PCT: 6.3 % (ref 3.0–12.0)
Monocytes Absolute: 0.7 10*3/uL (ref 0.1–1.0)
Neutro Abs: 8.2 10*3/uL — ABNORMAL HIGH (ref 1.4–7.7)
Neutrophils Relative %: 71.5 % (ref 43.0–77.0)
PLATELETS: 932 10*3/uL — AB (ref 150.0–400.0)
RBC: 4.07 Mil/uL — ABNORMAL LOW (ref 4.22–5.81)
RDW: 30.9 % — AB (ref 11.5–15.5)
WBC: 11.5 10*3/uL — ABNORMAL HIGH (ref 4.0–10.5)

## 2014-02-12 LAB — BASIC METABOLIC PANEL
BUN: 17 mg/dL (ref 6–23)
CO2: 29 meq/L (ref 19–32)
Calcium: 8.9 mg/dL (ref 8.4–10.5)
Chloride: 104 mEq/L (ref 96–112)
Creatinine, Ser: 1.2 mg/dL (ref 0.4–1.5)
GFR: 59.2 mL/min — ABNORMAL LOW (ref >60.00)
Glucose, Bld: 99 mg/dL (ref 70–99)
POTASSIUM: 4.5 meq/L (ref 3.5–5.1)
SODIUM: 138 meq/L (ref 135–145)

## 2014-02-12 LAB — HEPATIC FUNCTION PANEL
ALBUMIN: 3.9 g/dL (ref 3.5–5.2)
ALK PHOS: 71 U/L (ref 39–117)
ALT: 20 U/L (ref 0–53)
AST: 25 U/L (ref 0–37)
BILIRUBIN DIRECT: 0 mg/dL (ref 0.0–0.3)
TOTAL PROTEIN: 6.1 g/dL (ref 6.0–8.3)
Total Bilirubin: 0.6 mg/dL (ref 0.2–1.2)

## 2014-02-12 NOTE — Progress Notes (Signed)
Subjective:    Patient ID: Daniel Ali, male    DOB: [birth year], 80 y.o.   MRN: [id number]  HPI  Here after being seen at [facility] with 14 xrays after fell walking into church, slipped on a step, hit right nose on the ground and right chest on a curb, no blood, but could not get up at the time.  Now with pain mostly to right rib cage, right elbow, shoulder and back, has mult bruises to legs and right side as well.  No broken bones according to [place].  Can only raise the shoulder a short distance.  Still feels overall beat up all over, sleeping about 4-5 more hrs per day due to fatigue since the fall.  Tried to swim yesterday, only made it half distance, usually swims .25 mile 2-3 days per wk.   Pt denies fever, wt loss, night sweats, loss of appetite, or other constitutional symptoms  Pt denies chest pain, increased sob or doe, wheezing, orthopnea, PND, increased LE swelling, palpitations, dizziness or syncope. Past Medical History  Diagnosis Date  . Loss of height   . Compression fracture   . Hypertension   . Hypercholesterolemia   . Rhinitis   . Dyspnea   . Weight loss   . CAD (coronary artery disease)   . Peptic ulcer disease   . Alcohol abuse   . AMI (acute mesenteric ischemia)   . Colon polyps   . Pleurisy with effusion   . Grief reaction   . Macular degeneration    Past Surgical History  Procedure Laterality Date  . Exploratory laparotomy  1978    due to kidney problems  . Thoracotomy  1982    hemothorax  . Back surgery  2003    Cervical Spondylosis  . Spine surgery  2003    C-spine  . Cardiac catheterization  06/21/03  . Coronary stent placement  07/2003    x3  . Coronary angioplasty with stent placement  05/2004  . Colonoscopy w/ polypectomy  11/2006    reports that he quit smoking about 67 years ago. He has never used smokeless tobacco. He reports that he does not drink alcohol or use illicit drugs. family history includes Colon cancer in his mother; Diabetes in his  father. Allergies  Allergen Reactions  . Cefprozil     REACTION: insomnia  . Simvastatin     REACTION: MUSCLE ACHES   Current Outpatient Prescriptions on File Prior to Visit  Medication Sig Dispense Refill  . albuterol (PROVENTIL HFA;VENTOLIN HFA) 108 (90 BASE) MCG/ACT inhaler Inhale 2 puffs into the lungs every 4 (four) hours as needed.      Marland Kitchen albuterol (PROVENTIL) (2.5 MG/3ML) 0.083% nebulizer solution Take 3 mLs (2.5 mg total) by nebulization every 6 (six) hours as needed for wheezing or shortness of breath.  150 mL  1  . albuterol-ipratropium (COMBIVENT) 18-103 MCG/ACT inhaler Inhale 2 puffs into the lungs every 6 (six) hours as needed.  1 Inhaler  6  . Artificial Tear Solution (TEARS NATURALE OP) Apply to eye daily.      Marland Kitchen aspirin 81 MG tablet Take 81 mg by mouth daily.        . budesonide-formoterol (SYMBICORT) 80-4.5 MCG/ACT inhaler Inhale 2 puffs into the lungs 2 (two) times daily.      . Cholecalciferol (VITAMIN D-3) 5000 UNITS TABS Take by mouth daily.        . Coenzyme Q10 (COQ10) 100 MG CAPS Take by mouth daily.        Marland Kitchen  fluticasone (FLONASE) 50 MCG/ACT nasal spray Place 2 sprays into the nose daily.  16 g  6  . formoterol (PERFOROMIST) 20 MCG/2ML nebulizer solution Take 2 mLs (20 mcg total) by nebulization 2 (two) times daily.      . isosorbide mononitrate (IMDUR) 30 MG 24 hr tablet Take 1 tablet (30 mg total) by mouth daily.  30 tablet  5  . LUTEIN PO Take by mouth 2 (two) times daily.      . metoprolol succinate (TOPROL-XL) 50 MG 24 hr tablet take 1/4 tablet by mouth twice a day      . Multiple Minerals-Vitamins (CALCIUM & VIT D3 BONE HEALTH PO) Take by mouth daily.       . Multiple Vitamins-Minerals (ANTIOXIDANT) TABS Take by mouth daily.        . nitroGLYCERIN (NITROSTAT) 0.4 MG SL tablet Place 0.4 mg under the tongue every 5 (five) minutes as needed.        . Omega-3 Fatty Acids (FISH OIL) 1000 MG CAPS Take by mouth daily.       No current facility-administered  medications on file prior to visit.   Review of Systems  Constitutional: Negative for unusual diaphoresis or other sweats  HENT: Negative for ringing in ear Eyes: Negative for double vision or worsening visual disturbance.  Respiratory: Negative for choking and stridor.   Gastrointestinal: Negative for vomiting or other signifcant bowel change Genitourinary: Negative for hematuria or decreased urine volume.  Musculoskeletal: Negative for other MSK pain or swelling Skin: Negative for color change and worsening wound.  Neurological: Negative for tremors and numbness other than noted  Psychiatric/Behavioral: Negative for decreased concentration or agitation other than above       Objective:   Physical Exam BP 120/70  Pulse 66  Temp(Src) 98.4 F (36.9 C) (Oral)  Wt 169 lb 8 oz (76.885 kg)  SpO2 97% VS noted,  Constitutional: Pt appears well-developed, well-nourished.  HENT: Head: NCAT.  Right Ear: External ear normal.  Left Ear: External ear normal.  Eyes: . Pupils are equal, round, and reactive to light. Conjunctivae and EOM are normal Neck: Normal range of motion. Neck supple.  Cardiovascular: Normal rate and regular rhythm.   Pulmonary/Chest: Effort normal and breath sounds normal.  Abd:  Soft, NT, ND, + BS Neurological: Pt is alert. Not confused , motor grossly intact Skin: Skin is warm. No rash Psychiatric: Pt behavior is normal. No agitation.     Assessment & Plan:

## 2014-02-12 NOTE — Patient Instructions (Signed)
Very nice to meet you Injection today Ice 20 minutes 2 times a day  exercises 3 times a week start in  2 days.  Com back in 2 weeks if shoulder or knee still hurts.

## 2014-02-12 NOTE — Progress Notes (Signed)
Pre visit review using our clinic review tool, if applicable. No additional management support is needed unless otherwise documented below in the visit note. 

## 2014-02-12 NOTE — Patient Instructions (Signed)
Please continue all other medications as before, and refills have been done if requested. Please have the pharmacy call with any other refills you may need.  Please go to the LAB in the Basement (turn left off the elevator) for the tests to be done today  You will be contacted by phone if any changes need to be made immediately.  Otherwise, you will receive a letter about your results with an explanation, but please check with MyChart first.  You are to see Dr Tamala Julian in our office today about your right shoulder  Please see Dr Linna Darner as you have planned

## 2014-02-13 ENCOUNTER — Encounter: Payer: Self-pay | Admitting: Family Medicine

## 2014-02-13 DIAGNOSIS — S46009A Unspecified injury of muscle(s) and tendon(s) of the rotator cuff of unspecified shoulder, initial encounter: Secondary | ICD-10-CM | POA: Insufficient documentation

## 2014-02-13 NOTE — Assessment & Plan Note (Signed)
Patient does have a bruise or contusion to the rotator cuff. Patient did have an injection and did have complete resolution of pain immediately. We discussed with patient though that this can take some time. We discussed getting x-rays which were ordered today. Patient's was hesitant within feeling somewhat better. Patient was given home exercise program 3 times a week and we discussed icing. Because the patient's other comorbidities has been on it had any other medications. Patient will followup again in 2-3 weeks for further evaluation.

## 2014-02-13 NOTE — Progress Notes (Signed)
  [facility] [place], [place] [address] Phone: [phone] Subjective:    I'm seeing this patient by the request  of:  Unice Cobble, MD   CC: Right shoulder pain  ELF:[id number] Daniel Ali is a 80 y.o. male coming in with complaint of right shoulder pain. Patient states 2 weeks ago he did follow had significant bruising on the right side of his body. Patient states everything else seems to be getting better slowly but his right shoulder still continues to hurt. Patient has difficulty even lifting his arm. Patient states that it is a chronic dull aching sensation that can have a sharp pain with certain movements. Patient states that sometimes can have some mild radiation down the arm. Patient has had a history of cervical neck surgery but states that this is different type of pain. Patient is a severity of 8/10 because it is affecting his activities of daily living and making it difficult to sleep. Patient has tried some mild over-the-counter medications with minimal improvement.    Past medical history, social, surgical and family history all reviewed in electronic medical record.   Review of Systems: No headache, visual changes, nausea, vomiting, diarrhea, constipation, dizziness, abdominal pain, skin rash, fevers, chills, night sweats, weight loss, swollen lymph nodes, body aches, joint swelling, muscle aches, chest pain, shortness of breath, mood changes.   Objective Blood pressure 120/70, pulse 66, SpO2 97.00%.  General: No apparent distress alert and oriented x3 mood and affect normal, dressed appropriately.  HEENT: Pupils equal, extraocular movements intact  Respiratory: Patient's speak in full sentences and does not appear short of breath  Cardiovascular: No lower extremity edema, non tender, no erythema  Skin: Warm dry intact with no signs of infection or rash on extremities or on axial skeleton. Patient does have some mild  bruising on the right lateral aspect of the knee and mild on the right elbow the patient has good range of motion. Abdomen: Soft nontender  Neuro: Cranial nerves II through XII are intact, neurovascularly intact in all extremities with 2+ DTRs and 2+ pulses.  Lymph: No lymphadenopathy of posterior or anterior cervical chain or axillae bilaterally.  Gait normal with good balance and coordination.  MSK:  Non tender with full range of motion and good stability and symmetric strength and tone of  elbows, wrist, hip, knee and ankles bilaterally.  Shoulder: Right Inspection reveals no abnormalities, atrophy or asymmetry. Patient though does have loss of muscle strength of the shoulder girdle bilaterally. Palpation is normal with no tenderness over AC joint or bicipital groove. ROM is limited actively secondary to pain but does have full passive range of motion. Rotator cuff strength normal throughout.  signs of impingement with positive Neer and Hawkin's tests, empty can sign. Speeds and Yergason's tests normal. No labral pathology noted with negative Obrien's, negative clunk and good stability. Normal scapular function observed. No painful arc and no drop arm sign. No apprehension sign   Procedure note After informed written and verbal consent, patient was seated on exam table. Right shoulder was prepped with alcohol swab and utilizing posterior approach, patient's right glenohumeral space was injected with 4:1  marcaine 0.5%: Kenalog 40mg /dL. Patient tolerated the procedure well without immediate complications.    Impression and Recommendations:     This case required medical decision making of moderate complexity.

## 2014-02-15 DIAGNOSIS — M25512 Pain in left shoulder: Secondary | ICD-10-CM | POA: Insufficient documentation

## 2014-02-15 DIAGNOSIS — W1800XA Striking against unspecified object with subsequent fall, initial encounter: Secondary | ICD-10-CM | POA: Insufficient documentation

## 2014-02-15 NOTE — Assessment & Plan Note (Signed)
Etiology unclear, for lab evaluation today

## 2014-02-15 NOTE — Assessment & Plan Note (Signed)
For sport med referral,  to f/u any worsening symptoms or concerns  

## 2014-02-15 NOTE — Assessment & Plan Note (Signed)
stable overall by history and exam, recent data reviewed with pt, and pt to continue medical treatment as before,  to f/u any worsening symptoms or concerns BP Readings from Last 3 Encounters:  02/12/14 120/70  02/12/14 120/70  12/09/13 120/60

## 2014-03-19 ENCOUNTER — Ambulatory Visit (INDEPENDENT_AMBULATORY_CARE_PROVIDER_SITE_OTHER): Payer: Medicare Other | Admitting: Internal Medicine

## 2014-03-19 ENCOUNTER — Encounter: Payer: Self-pay | Admitting: Internal Medicine

## 2014-03-19 VITALS — BP 120/60 | HR 64 | Temp 98.2°F | Wt 175.2 lb

## 2014-03-19 DIAGNOSIS — G459 Transient cerebral ischemic attack, unspecified: Secondary | ICD-10-CM
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
## 2014-03-19 NOTE — Progress Notes (Signed)
Pre visit review using our clinic review tool, if applicable. No additional management support is needed unless otherwise documented below in the visit note. 

## 2014-03-19 NOTE — Patient Instructions (Signed)
I feel that there  would be limited risk with you engaging in a water aerobics class at the [facility]. Also holding onto the side of the pool while performing leg exercises in the shallow end is certainly reasonable. Report your progress to your physician at the [facility].

## 2014-04-24 ENCOUNTER — Emergency Department (HOSPITAL_BASED_OUTPATIENT_CLINIC_OR_DEPARTMENT_OTHER): Payer: Medicare Other

## 2014-04-24 ENCOUNTER — Emergency Department (HOSPITAL_BASED_OUTPATIENT_CLINIC_OR_DEPARTMENT_OTHER)
Admission: EM | Admit: 2014-04-24 | Discharge: 2014-04-24 | Disposition: A | Discharge status: Home / Self Care | Payer: Medicare Other | Attending: Emergency Medicine | Admitting: Emergency Medicine

## 2014-04-24 ENCOUNTER — Encounter (HOSPITAL_BASED_OUTPATIENT_CLINIC_OR_DEPARTMENT_OTHER): Payer: Self-pay | Admitting: Emergency Medicine

## 2014-04-24 DIAGNOSIS — I2510 Atherosclerotic heart disease of native coronary artery without angina pectoris: Secondary | ICD-10-CM | POA: Diagnosis not present

## 2014-04-24 DIAGNOSIS — Z9889 Other specified postprocedural states: Secondary | ICD-10-CM | POA: Insufficient documentation

## 2014-04-24 DIAGNOSIS — D473 Essential (hemorrhagic) thrombocythemia: Secondary | ICD-10-CM | POA: Insufficient documentation

## 2014-04-24 DIAGNOSIS — S3981XA Other specified injuries of abdomen, initial encounter: Secondary | ICD-10-CM | POA: Insufficient documentation

## 2014-04-24 DIAGNOSIS — E7800 Pure hypercholesterolemia, unspecified: Secondary | ICD-10-CM | POA: Diagnosis not present

## 2014-04-24 DIAGNOSIS — I10 Essential (primary) hypertension: Secondary | ICD-10-CM | POA: Insufficient documentation

## 2014-04-24 DIAGNOSIS — S20211A Contusion of right front wall of thorax, initial encounter: Secondary | ICD-10-CM

## 2014-04-24 DIAGNOSIS — Z8719 Personal history of other diseases of the digestive system: Secondary | ICD-10-CM | POA: Insufficient documentation

## 2014-04-24 DIAGNOSIS — S298XXA Other specified injuries of thorax, initial encounter: Secondary | ICD-10-CM | POA: Diagnosis present

## 2014-04-24 DIAGNOSIS — Y92410 Unspecified street and highway as the place of occurrence of the external cause: Secondary | ICD-10-CM | POA: Insufficient documentation

## 2014-04-24 DIAGNOSIS — Z87891 Personal history of nicotine dependence: Secondary | ICD-10-CM | POA: Insufficient documentation

## 2014-04-24 DIAGNOSIS — Z8669 Personal history of other diseases of the nervous system and sense organs: Secondary | ICD-10-CM | POA: Diagnosis not present

## 2014-04-24 DIAGNOSIS — Z9861 Coronary angioplasty status: Secondary | ICD-10-CM | POA: Insufficient documentation

## 2014-04-24 DIAGNOSIS — Z860100 Personal history of colon polyps, unspecified: Secondary | ICD-10-CM | POA: Insufficient documentation

## 2014-04-24 DIAGNOSIS — Z8711 Personal history of peptic ulcer disease: Secondary | ICD-10-CM | POA: Insufficient documentation

## 2014-04-24 DIAGNOSIS — Z79899 Other long term (current) drug therapy: Secondary | ICD-10-CM | POA: Insufficient documentation

## 2014-04-24 DIAGNOSIS — D75839 Thrombocytosis, unspecified: Secondary | ICD-10-CM

## 2014-04-24 DIAGNOSIS — Y9389 Activity, other specified: Secondary | ICD-10-CM | POA: Insufficient documentation

## 2014-04-24 DIAGNOSIS — Z7982 Long term (current) use of aspirin: Secondary | ICD-10-CM | POA: Insufficient documentation

## 2014-04-24 DIAGNOSIS — S20219A Contusion of unspecified front wall of thorax, initial encounter: Secondary | ICD-10-CM | POA: Insufficient documentation

## 2014-04-24 LAB — COMPREHENSIVE METABOLIC PANEL
ALBUMIN: 3.5 g/dL (ref 3.5–5.2)
ALT: 32 U/L (ref 0–53)
ANION GAP: 11 (ref 5–15)
AST: 33 U/L (ref 0–37)
Alkaline Phosphatase: 117 U/L (ref 39–117)
BUN: 23 mg/dL (ref 6–23)
CO2: 27 mEq/L (ref 19–32)
CREATININE: 1.2 mg/dL (ref 0.50–1.35)
Calcium: 9 mg/dL (ref 8.4–10.5)
Chloride: 104 mEq/L (ref 96–112)
GFR calc Af Amer: 61 mL/min — ABNORMAL LOW (ref >90)
GFR calc non Af Amer: 53 mL/min — ABNORMAL LOW (ref >90)
Glucose, Bld: 79 mg/dL (ref 70–99)
Potassium: 4.7 mEq/L (ref 3.7–5.3)
Sodium: 142 mEq/L (ref 137–147)
TOTAL PROTEIN: 6 g/dL (ref 6.0–8.3)
Total Bilirubin: 0.5 mg/dL (ref 0.3–1.2)

## 2014-04-24 LAB — CBC WITH DIFFERENTIAL/PLATELET
Basophils Absolute: 0.4 10*3/uL — ABNORMAL HIGH (ref 0.0–0.1)
Basophils Relative: 3 % — ABNORMAL HIGH (ref 0–1)
EOS ABS: 0.4 10*3/uL (ref 0.0–0.7)
Eosinophils Relative: 3 % (ref 0–5)
HCT: 35.5 % — ABNORMAL LOW (ref 39.0–52.0)
Hemoglobin: 11.7 g/dL — ABNORMAL LOW (ref 13.0–17.0)
LYMPHS PCT: 17 % (ref 12–46)
Lymphs Abs: 2.5 10*3/uL (ref 0.7–4.0)
MCH: 27.9 pg (ref 26.0–34.0)
MCHC: 33 g/dL (ref 30.0–36.0)
MCV: 84.7 fL (ref 78.0–100.0)
MONO ABS: 1.2 10*3/uL — AB (ref 0.1–1.0)
Monocytes Relative: 8 % (ref 3–12)
Neutro Abs: 10.2 10*3/uL — ABNORMAL HIGH (ref 1.7–7.7)
Neutrophils Relative %: 69 % (ref 43–77)
PLATELETS: 1022 10*3/uL — AB (ref 150–400)
RBC: 4.19 MIL/uL — ABNORMAL LOW (ref 4.22–5.81)
RDW: 28.3 % — ABNORMAL HIGH (ref 11.5–15.5)
WBC: 14.7 10*3/uL — ABNORMAL HIGH (ref 4.0–10.5)

## 2014-04-24 MED ORDER — IOHEXOL 300 MG/ML  SOLN
100.0000 mL | Freq: Once | INTRAMUSCULAR | Status: AC | PRN
  Administered 2014-04-24: 100 mL via INTRAVENOUS

## 2014-04-24 MED ORDER — SODIUM CHLORIDE 0.9 % IV SOLN
Freq: Once | INTRAVENOUS | Status: AC
  Administered 2014-04-24: 17:00:00 via INTRAVENOUS

## 2014-04-24 MED ORDER — MORPHINE SULFATE 2 MG/ML IJ SOLN
2.0000 mg | Freq: Once | INTRAMUSCULAR | Status: AC
  Administered 2014-04-24: 2 mg via INTRAVENOUS
  Filled 2014-04-24: qty 1

## 2014-04-24 MED ORDER — OXYCODONE-ACETAMINOPHEN 5-325 MG PO TABS: 1.0000 | ORAL_TABLET | ORAL | Status: DC | PRN
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
## 2014-04-25 LAB — PATHOLOGIST SMEAR REVIEW

## 2014-04-28 ENCOUNTER — Telehealth: Payer: Self-pay | Admitting: *Deleted
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
## 2014-04-30 ENCOUNTER — Other Ambulatory Visit (INDEPENDENT_AMBULATORY_CARE_PROVIDER_SITE_OTHER): Payer: Medicare Other

## 2014-04-30 ENCOUNTER — Encounter: Payer: Self-pay | Admitting: Internal Medicine

## 2014-04-30 ENCOUNTER — Ambulatory Visit (INDEPENDENT_AMBULATORY_CARE_PROVIDER_SITE_OTHER)
Admission: RE | Admit: 2014-04-30 | Discharge: 2014-04-30 | Disposition: A | Discharge status: Home / Self Care | Payer: Medicare Other | Source: Ambulatory Visit | Attending: Internal Medicine | Admitting: Internal Medicine

## 2014-04-30 ENCOUNTER — Other Ambulatory Visit: Payer: Self-pay | Admitting: Internal Medicine

## 2014-04-30 ENCOUNTER — Ambulatory Visit (INDEPENDENT_AMBULATORY_CARE_PROVIDER_SITE_OTHER): Payer: Medicare Other | Admitting: Internal Medicine

## 2014-04-30 VITALS — BP 112/60 | HR 64 | Temp 97.9°F | Wt 176.0 lb

## 2014-04-30 DIAGNOSIS — S299XXD Unspecified injury of thorax, subsequent encounter: Secondary | ICD-10-CM

## 2014-04-30 DIAGNOSIS — R609 Edema, unspecified: Secondary | ICD-10-CM

## 2014-04-30 DIAGNOSIS — Z5189 Encounter for other specified aftercare: Secondary | ICD-10-CM

## 2014-04-30 DIAGNOSIS — S2231XK Fracture of one rib, right side, subsequent encounter for fracture with nonunion: Secondary | ICD-10-CM

## 2014-04-30 DIAGNOSIS — J90 Pleural effusion, not elsewhere classified: Secondary | ICD-10-CM

## 2014-04-30 DIAGNOSIS — I2510 Atherosclerotic heart disease of native coronary artery without angina pectoris: Secondary | ICD-10-CM

## 2014-04-30 DIAGNOSIS — S298XXA Other specified injuries of thorax, initial encounter: Secondary | ICD-10-CM

## 2014-04-30 DIAGNOSIS — IMO0002 Reserved for concepts with insufficient information to code with codable children: Secondary | ICD-10-CM

## 2014-04-30 LAB — CBC WITH DIFFERENTIAL/PLATELET
BASOS PCT: 1.4 % (ref 0.0–3.0)
Basophils Absolute: 0.2 10*3/uL — ABNORMAL HIGH (ref 0.0–0.1)
Eosinophils Absolute: 0.4 10*3/uL (ref 0.0–0.7)
Eosinophils Relative: 3.4 % (ref 0.0–5.0)
HEMATOCRIT: 37 % — AB (ref 39.0–52.0)
Hemoglobin: 12 g/dL — ABNORMAL LOW (ref 13.0–17.0)
LYMPHS ABS: 2.1 10*3/uL (ref 0.7–4.0)
Lymphocytes Relative: 18.1 % (ref 12.0–46.0)
MCHC: 32.4 g/dL (ref 30.0–36.0)
MCV: 86.3 fl (ref 78.0–100.0)
MONO ABS: 0.9 10*3/uL (ref 0.1–1.0)
Monocytes Relative: 7.7 % (ref 3.0–12.0)
Neutro Abs: 8.1 10*3/uL — ABNORMAL HIGH (ref 1.4–7.7)
Neutrophils Relative %: 69.4 % (ref 43.0–77.0)
Platelets: 1205 10*3/uL — ABNORMAL HIGH (ref 150.0–400.0)
RBC: 4.29 Mil/uL (ref 4.22–5.81)
RDW: 30.8 % — ABNORMAL HIGH (ref 11.5–15.5)
WBC: 11.7 10*3/uL — ABNORMAL HIGH (ref 4.0–10.5)

## 2014-04-30 LAB — BASIC METABOLIC PANEL
BUN: 21 mg/dL (ref 6–23)
CHLORIDE: 109 meq/L (ref 96–112)
CO2: 30 mEq/L (ref 19–32)
Calcium: 8.7 mg/dL (ref 8.4–10.5)
Creatinine, Ser: 1.2 mg/dL (ref 0.4–1.5)
GFR: 63.31 mL/min (ref >60.00)
Glucose, Bld: 77 mg/dL (ref 70–99)
POTASSIUM: 4.7 meq/L (ref 3.5–5.1)
Sodium: 142 mEq/L (ref 135–145)

## 2014-04-30 MED ORDER — FUROSEMIDE 40 MG PO TABS: 40.0000 mg | ORAL_TABLET | Freq: Every day | ORAL | Status: DC

## 2014-04-30 MED ORDER — KETOCONAZOLE 2 % EX CREA: 1.0000 "application " | TOPICAL_CREAM | Freq: Every day | CUTANEOUS | Status: DC

## 2014-04-30 MED ORDER — TRAMADOL HCL 50 MG PO TABS: ORAL_TABLET | ORAL | Status: DC

## 2014-04-30 NOTE — Patient Instructions (Signed)
Your next office appointment will be determined based upon review of your pending labs & x-rays. Those instructions will be transmitted to you by mail.

## 2014-04-30 NOTE — Progress Notes (Signed)
Pre visit review using our clinic review tool, if applicable. No additional management support is needed unless otherwise documented below in the visit note. 
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
## 2014-05-01 ENCOUNTER — Telehealth: Payer: Self-pay | Admitting: Hematology and Oncology

## 2014-05-01 ENCOUNTER — Other Ambulatory Visit: Payer: Self-pay | Admitting: Internal Medicine

## 2014-05-01 DIAGNOSIS — Z86718 Personal history of other venous thrombosis and embolism: Secondary | ICD-10-CM

## 2014-05-01 DIAGNOSIS — R7989 Other specified abnormal findings of blood chemistry: Secondary | ICD-10-CM | POA: Insufficient documentation

## 2014-05-01 NOTE — Telephone Encounter (Signed)
S/W PATIENT AND GAVE NP APPT FOR 07/29 @ 1:30 W/DR. [place]. REFERRING DR. Gwyndolyn Saxon HOPPER DX- ELEVATED PLT CT

## 2014-05-02 ENCOUNTER — Other Ambulatory Visit: Payer: Medicare Other

## 2014-05-02 ENCOUNTER — Ambulatory Visit: Payer: Medicare Other

## 2014-05-07 ENCOUNTER — Ambulatory Visit (HOSPITAL_BASED_OUTPATIENT_CLINIC_OR_DEPARTMENT_OTHER): Payer: Medicare Other | Admitting: Hematology and Oncology

## 2014-05-07 ENCOUNTER — Encounter: Payer: Self-pay | Admitting: Hematology and Oncology

## 2014-05-07 ENCOUNTER — Telehealth: Payer: Self-pay | Admitting: Hematology and Oncology

## 2014-05-07 ENCOUNTER — Ambulatory Visit: Payer: Medicare Other

## 2014-05-07 ENCOUNTER — Ambulatory Visit (HOSPITAL_BASED_OUTPATIENT_CLINIC_OR_DEPARTMENT_OTHER): Payer: Medicare Other

## 2014-05-07 VITALS — BP 115/66 | HR 64 | Temp 97.9°F | Resp 20 | Ht 67.0 in | Wt 173.6 lb

## 2014-05-07 DIAGNOSIS — D72829 Elevated white blood cell count, unspecified: Secondary | ICD-10-CM

## 2014-05-07 DIAGNOSIS — D759 Disease of blood and blood-forming organs, unspecified: Secondary | ICD-10-CM

## 2014-05-07 DIAGNOSIS — D649 Anemia, unspecified: Secondary | ICD-10-CM

## 2014-05-07 DIAGNOSIS — R600 Localized edema: Secondary | ICD-10-CM

## 2014-05-07 DIAGNOSIS — R296 Repeated falls: Secondary | ICD-10-CM | POA: Insufficient documentation

## 2014-05-07 DIAGNOSIS — R7989 Other specified abnormal findings of blood chemistry: Secondary | ICD-10-CM

## 2014-05-07 DIAGNOSIS — G459 Transient cerebral ischemic attack, unspecified: Secondary | ICD-10-CM

## 2014-05-07 DIAGNOSIS — R609 Edema, unspecified: Secondary | ICD-10-CM

## 2014-05-07 DIAGNOSIS — Z9181 History of falling: Secondary | ICD-10-CM

## 2014-05-07 DIAGNOSIS — D473 Essential (hemorrhagic) thrombocythemia: Secondary | ICD-10-CM

## 2014-05-07 HISTORY — DX: Localized edema: R60.0

## 2014-05-07 HISTORY — DX: Repeated falls: R29.6

## 2014-05-07 HISTORY — DX: Elevated white blood cell count, unspecified: D72.829

## 2014-05-07 LAB — CBC & DIFF AND RETIC
BASO%: 1.6 % (ref 0.0–2.0)
Basophils Absolute: 0.2 10*3/uL — ABNORMAL HIGH (ref 0.0–0.1)
EOS ABS: 0.5 10*3/uL (ref 0.0–0.5)
EOS%: 4 % (ref 0.0–7.0)
HCT: 35.9 % — ABNORMAL LOW (ref 38.4–49.9)
HGB: 11.5 g/dL — ABNORMAL LOW (ref 13.0–17.1)
Immature Retic Fract: 10.2 % (ref 3.00–10.60)
LYMPH%: 17.8 % (ref 14.0–49.0)
MCH: 26.8 pg — AB (ref 27.2–33.4)
MCHC: 32 g/dL (ref 32.0–36.0)
MCV: 83.7 fL (ref 79.3–98.0)
MONO#: 0.8 10*3/uL (ref 0.1–0.9)
MONO%: 7.2 % (ref 0.0–14.0)
NEUT%: 69.4 % (ref 39.0–75.0)
NEUTROS ABS: 7.8 10*3/uL — AB (ref 1.5–6.5)
NRBC: 0 % (ref 0–0)
PLATELETS: 882 10*3/uL — AB (ref 140–400)
RBC: 4.29 10*6/uL (ref 4.20–5.82)
RDW: 27.5 % — ABNORMAL HIGH (ref 11.0–14.6)
Retic %: 1.3 % (ref 0.80–1.80)
Retic Ct Abs: 55.77 10*3/uL (ref 34.80–93.90)
WBC: 11.2 10*3/uL — ABNORMAL HIGH (ref 4.0–10.3)
lymph#: 2 10*3/uL (ref 0.9–3.3)

## 2014-05-07 LAB — COMPREHENSIVE METABOLIC PANEL (CC13)
ALBUMIN: 3.3 g/dL — AB (ref 3.5–5.0)
ALT: 18 U/L (ref 0–55)
ANION GAP: 5 meq/L (ref 3–11)
AST: 25 U/L (ref 5–34)
Alkaline Phosphatase: 103 U/L (ref 40–150)
BILIRUBIN TOTAL: 0.43 mg/dL (ref 0.20–1.20)
BUN: 21.6 mg/dL (ref 7.0–26.0)
CO2: 28 meq/L (ref 22–29)
Calcium: 8.6 mg/dL (ref 8.4–10.4)
Chloride: 109 mEq/L (ref 98–109)
Creatinine: 1.2 mg/dL (ref 0.7–1.3)
Glucose: 102 mg/dl (ref 70–140)
POTASSIUM: 4.4 meq/L (ref 3.5–5.1)
SODIUM: 143 meq/L (ref 136–145)
TOTAL PROTEIN: 5.7 g/dL — AB (ref 6.4–8.3)

## 2014-05-07 LAB — TECHNOLOGIST REVIEW

## 2014-05-07 MED ORDER — HYDROXYUREA 500 MG PO CAPS: 500.0000 mg | ORAL_CAPSULE | Freq: Every day | ORAL | Status: DC

## 2014-05-07 NOTE — Assessment & Plan Note (Signed)
This is highly suspicious related to the elevated platelet count. He would continue aspirin therapy.
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
## 2014-05-07 NOTE — Assessment & Plan Note (Signed)
I am concerned he may have undiagnosed myeloproliferative disorder. I will order an additional workup for this. He is on aspirin. I am afraid that may not be enough as the patient had recent transient ischemic attack. I would proceed to prescribe hydroxyurea. I told the patient, in general, I do not like to prescribe medication without an appropriate diagnosis. However, in this situation, the risk of not treating him now outweighs the risk of side effects. I discussed with him the risks, benefits, side effects of hydroxyurea and he agrees to proceed. I gave the patient education handout and warn him about side effects.

## 2014-05-07 NOTE — Assessment & Plan Note (Signed)
I am wondering whether the patient may have undiagnosed congestive heart failure. I will order additional blood test. I would address this issue in the next visit and consider cardiology consultation if confirmed he has elevated BNP.

## 2014-05-07 NOTE — Patient Instructions (Signed)
Hydroxyurea capsules What is this medicine? HYDROXYUREA (hye drox ee yoor EE a) is a chemotherapy drug. It slows the growth of cancer cells. This medicine is used to treat certain leukemias, skin cancer, head and neck cancer, and advanced ovarian cancer. It is also used to control the painful crises of sickle cell anemia. This medicine may be used for other purposes; ask your health care provider or pharmacist if you have questions. COMMON BRAND NAME(S): Droxia, Hydrea What should I tell my health care provider before I take this medicine? They need to know if you have any of these conditions: -immune system problems -infection (especially a virus infection such as chickenpox, cold sores, or herpes) -kidney disease -low blood counts, like low white cell, platelet, or red cell counts -previous or ongoing radiation therapy -an unusual or allergic reaction to hydroxyurea, other chemotherapy, other medicines, foods, dyes, or preservatives -pregnant or trying to get pregnant -breast-feeding How should I use this medicine? Take this medicine by mouth with a glass of water. Follow the directions on the prescription label. Take your medicine at regular intervals. Do not take it more often than directed. Do not stop taking except on your doctor's advice. People who are not taking this medicine should not be exposed to it. Wash your hands before and after handling your bottle or medicine. Caregivers should wear disposable gloves if they must touch the bottle or medicine. Clean up any medicine powder that spills with a damp disposable towel and throw the towel away in a closed container, such as a plastic bag. Talk to your pediatrician regarding the use of this medicine in children. Special care may be needed. Patients over 65 years old may have a stronger reaction and need a smaller dose. Overdosage: If you think you have taken too much of this medicine contact a poison control center or emergency room at  once. NOTE: This medicine is only for you. Do not share this medicine with others. What if I miss a dose? If you miss a dose, take it as soon as you can. If it is almost time for your next dose, take only that dose. Do not take double or extra doses. What may interact with this medicine? -didanosine -other chemotherapy agents -stavudine -tenofovir -vaccines This list may not describe all possible interactions. Give your health care provider a list of all the medicines, herbs, non-prescription drugs, or dietary supplements you use. Also tell them if you smoke, drink alcohol, or use illegal drugs. Some items may interact with your medicine. What should I watch for while using this medicine? This drug may make you feel generally unwell. This is not uncommon, as chemotherapy can affect healthy cells as well as cancer cells. Report any side effects. Continue your course of treatment even though you feel ill unless your doctor tells you to stop. You will receive regular blood tests during your treatment. Call your doctor or health care professional for advice if you get a fever, chills or sore throat, or other symptoms of a cold or flu. Do not treat yourself. This drug decreases your body's ability to fight infections. Try to avoid being around people who are sick. This medicine may increase your risk to bruise or bleed. Call your doctor or health care professional if you notice any unusual bleeding. Be careful brushing and flossing your teeth or using a toothpick because you may get an infection or bleed more easily. If you have any dental work done, tell your dentist you are   receiving this medicine. Avoid taking products that contain aspirin, acetaminophen, ibuprofen, naproxen, or ketoprofen unless instructed by your doctor. These medicines may hide a fever. Do not become pregnant while taking this medicine. Women should inform their doctor if they wish to become pregnant or think they might be  pregnant. There is a potential for serious side effects to an unborn child. Men should inform their doctors if they wish to father a child. This medicine may lower sperm counts. Talk to your health care professional or pharmacist for more information. Do not breast-feed an infant while taking this medicine. What side effects may I notice from receiving this medicine? Side effects that you should report to your doctor or health care professional as soon as possible: -allergic reactions like skin rash, itching or hives, swelling of the face, lips, or tongue -low blood counts - this medicine may decrease the number of white blood cells, red blood cells and platelets. You may be at increased risk for infections and bleeding. -signs of infection - fever or chills, cough, sore throat, pain or difficulty passing urine -signs of decreased platelets or bleeding - bruising, pinpoint red spots on the skin, black, tarry stools, blood in the urine -signs of decreased red blood cells - unusually weak or tired, fainting spells, lightheadedness -breathing problems -burning, redness or pain at the site of any radiation therapy -changes in skin color -confusion -mouth sores -pain, tingling, numbness in the hands or feet -seizures -skin ulcers -trouble passing urine or change in the amount of urine -vomiting Side effects that usually do not require medical attention (report to your doctor or health care professional if they continue or are bothersome): -headache -loss of appetite -red color to the face This list may not describe all possible side effects. Call your doctor for medical advice about side effects. You may report side effects to FDA at 1-800-FDA-1088. Where should I keep my medicine? Keep out of the reach of children. Store at room temperature between 15 and 30 degrees C (59 and 86 degrees F). Keep tightly closed. Throw away any unused medicine after the expiration date. NOTE: This sheet is a  summary. It may not cover all possible information. If you have questions about this medicine, talk to your doctor, pharmacist, or health care provider.  2015, Elsevier/Gold Standard. (2008-02-08 15:03:29)  

## 2014-05-07 NOTE — Assessment & Plan Note (Signed)
I suspect this is due to undiagnosed myeloproliferative disorder. I will order additional workup.

## 2014-05-07 NOTE — Telephone Encounter (Signed)
gv and printed appt sched and avs for pt for Aug...sent pt to lab

## 2014-05-07 NOTE — Assessment & Plan Note (Signed)
The cane he is using is not the appropriate height. I am concerned about his safety. I recommend social worker consult to consider long-term placement.

## 2014-05-07 NOTE — Progress Notes (Signed)
Checked in new pt with no financial concerns. °

## 2014-05-08 ENCOUNTER — Telehealth: Payer: Self-pay | Admitting: Internal Medicine

## 2014-05-08 LAB — SEDIMENTATION RATE: Sed Rate: 1 mm/hr (ref 0–16)

## 2014-05-08 LAB — FERRITIN CHCC: FERRITIN: 50 ng/mL (ref 22–316)

## 2014-05-08 LAB — BRAIN NATRIURETIC PEPTIDE: Brain Natriuretic Peptide: 164.4 pg/mL — ABNORMAL HIGH (ref 0.0–100.0)

## 2014-05-08 NOTE — Telephone Encounter (Signed)
Apply once daily to rash

## 2014-05-08 NOTE — Telephone Encounter (Signed)
I spoke to patient and let him know to apply Ketoconazole to his arms daily since he mentioned at his office visit he arms were itching and dry.

## 2014-05-08 NOTE — Telephone Encounter (Signed)
Pt need a clarification about Ketoconazole that Dr. Linna Darner gave to him, but he doesn't know what is it for. Please call pt.

## 2014-05-09 ENCOUNTER — Encounter: Payer: Self-pay | Admitting: *Deleted

## 2014-05-09 NOTE — Progress Notes (Signed)
[place] [facility]  Clinical Social Work was referred by [employer] for assessment of psychosocial needs and possible skilled nursing placement.  Clinical Social Worker contacted patient by phone to offer support and assess for needs.  Daniel Ali reports his son is still visiting, but plans to go back to his home in a few days.  The patient feels they are taking the appropriate steps to ensure safety at home and he has many close friends in the area for additional support.  CSW introduced the concept of a rehab facility to regain strength and provide nursing supervision.  Daniel Ali was receptive to rehab, but does not feel he needs that level of care at this time.  Daniel Ali plans to follow up with CSW if additional needs arise.     Daniel Ali, MSW, LCSW, OSW-C Clinical Social Worker [facility] [phone]

## 2014-05-16 ENCOUNTER — Ambulatory Visit (INDEPENDENT_AMBULATORY_CARE_PROVIDER_SITE_OTHER): Payer: Medicare Other | Admitting: Cardiovascular Disease

## 2014-05-16 ENCOUNTER — Other Ambulatory Visit (HOSPITAL_BASED_OUTPATIENT_CLINIC_OR_DEPARTMENT_OTHER): Payer: Medicare Other

## 2014-05-16 ENCOUNTER — Encounter: Payer: Self-pay | Admitting: Cardiovascular Disease

## 2014-05-16 ENCOUNTER — Ambulatory Visit (HOSPITAL_BASED_OUTPATIENT_CLINIC_OR_DEPARTMENT_OTHER): Payer: Medicare Other | Admitting: Hematology and Oncology

## 2014-05-16 ENCOUNTER — Telehealth: Payer: Self-pay | Admitting: Hematology and Oncology

## 2014-05-16 ENCOUNTER — Encounter: Payer: Self-pay | Admitting: Hematology and Oncology

## 2014-05-16 VITALS — BP 126/60 | HR 60 | Temp 98.3°F | Resp 17 | Ht 67.0 in | Wt 177.1 lb

## 2014-05-16 VITALS — BP 115/64 | HR 64 | Wt 178.0 lb

## 2014-05-16 DIAGNOSIS — D471 Chronic myeloproliferative disease: Secondary | ICD-10-CM

## 2014-05-16 DIAGNOSIS — R7989 Other specified abnormal findings of blood chemistry: Secondary | ICD-10-CM

## 2014-05-16 DIAGNOSIS — Z8673 Personal history of transient ischemic attack (TIA), and cerebral infarction without residual deficits: Secondary | ICD-10-CM

## 2014-05-16 DIAGNOSIS — R609 Edema, unspecified: Secondary | ICD-10-CM

## 2014-05-16 DIAGNOSIS — R0602 Shortness of breath: Secondary | ICD-10-CM

## 2014-05-16 DIAGNOSIS — D630 Anemia in neoplastic disease: Secondary | ICD-10-CM

## 2014-05-16 DIAGNOSIS — D47Z9 Other specified neoplasms of uncertain behavior of lymphoid, hematopoietic and related tissue: Secondary | ICD-10-CM

## 2014-05-16 DIAGNOSIS — D649 Anemia, unspecified: Secondary | ICD-10-CM

## 2014-05-16 DIAGNOSIS — R600 Localized edema: Secondary | ICD-10-CM

## 2014-05-16 DIAGNOSIS — I10 Essential (primary) hypertension: Secondary | ICD-10-CM

## 2014-05-16 DIAGNOSIS — D72829 Elevated white blood cell count, unspecified: Secondary | ICD-10-CM

## 2014-05-16 DIAGNOSIS — G459 Transient cerebral ischemic attack, unspecified: Secondary | ICD-10-CM

## 2014-05-16 LAB — CBC WITH DIFFERENTIAL/PLATELET
BASO%: 2.4 % — ABNORMAL HIGH (ref 0.0–2.0)
BASOS ABS: 0.2 10*3/uL — AB (ref 0.0–0.1)
EOS%: 5.5 % (ref 0.0–7.0)
Eosinophils Absolute: 0.5 10*3/uL (ref 0.0–0.5)
HCT: 39 % (ref 38.4–49.9)
HEMOGLOBIN: 12.6 g/dL — AB (ref 13.0–17.1)
LYMPH%: 20.2 % (ref 14.0–49.0)
MCH: 27 pg — AB (ref 27.2–33.4)
MCHC: 32.3 g/dL (ref 32.0–36.0)
MCV: 83.5 fL (ref 79.3–98.0)
MONO#: 0.5 10*3/uL (ref 0.1–0.9)
MONO%: 5.2 % (ref 0.0–14.0)
NEUT#: 6.3 10*3/uL (ref 1.5–6.5)
NEUT%: 66.7 % (ref 39.0–75.0)
Platelets: 929 10*3/uL — ABNORMAL HIGH (ref 140–400)
RBC: 4.67 10*6/uL (ref 4.20–5.82)
RDW: 27.5 % — AB (ref 11.0–14.6)
WBC: 9.5 10*3/uL (ref 4.0–10.3)
lymph#: 1.9 10*3/uL (ref 0.9–3.3)
nRBC: 0 % (ref 0–0)

## 2014-05-16 NOTE — Assessment & Plan Note (Signed)
This is highly suspicious related to the elevated platelet count. He would continue aspirin therapy.

## 2014-05-16 NOTE — Assessment & Plan Note (Signed)
This is likely anemia of chronic disease and his underlying bone marrow disease. The patient denies recent history of bleeding such as epistaxis, hematuria or hematochezia. He is asymptomatic from the anemia. We will observe for now.   

## 2014-05-16 NOTE — Telephone Encounter (Signed)
pt confirmed labs/ov per 08/07 POF, gave pt AVS...KJ °

## 2014-05-16 NOTE — Assessment & Plan Note (Signed)
Peripheral blood detected JAK2 mutation. 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 Due to his age and comorbidities, I would not perform bone marrow aspirate and biopsy. He will remain on aspirin. I will increase hydroxyurea to 2 tablets a day in an attempt to bring his platelet count to near normal range.

## 2014-05-16 NOTE — Progress Notes (Signed)
[place] OFFICE PROGRESS NOTE  Patient Care Team: Hendricks Limes, MD as PCP - General  SUMMARY OF ONCOLOGIC HISTORY: He was found to have abnormal CBC from recent CBC monitoring when he saw his primary care provider for recurrent falls. This patient had history disease, with angioplasty and stent placement in 2002 and 2005.  In May of this year, he fell at [employer]. After a detailed history, he stated that he had misjudged the distance between his hands and railing and fell. He did not sustain major injury but his CBC showed mild leukocytosis, anemia and very high platelet count, platelet count over 800,000. According to his son, the patient may have transient ischemic attack with mild weakness which subsequently resolved. A week ago, he had accident and had broken ribs. He had CT imaging study which confirmed retractors. Repeat CBC showed platelet count over 1 million. He is hence referred here. 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 The patient was started on hydroxyurea. INTERVAL HISTORY: Please see below for problem oriented charting.  REVIEW OF SYSTEMS:   Constitutional: Denies fevers, chills or abnormal weight loss Eyes: Denies blurriness of vision Ears, nose, mouth, throat, and face: Denies mucositis or sore throat Respiratory: Denies cough, dyspnea or wheezes Cardiovascular: Denies palpitation, chest discomfort  Gastrointestinal:  Denies nausea, heartburn or change in bowel habits Skin: Denies abnormal skin rashes Lymphatics: Denies new lymphadenopathy or easy bruising Neurological:Denies numbness, tingling or new weaknesses Behavioral/Psych: Mood is stable, no new changes  All other systems were reviewed with the patient and are negative.  I have reviewed the past medical history, past surgical history, social history and family history with the patient and they are unchanged from previous note.  ALLERGIES:  is allergic to cefprozil and  simvastatin.  MEDICATIONS:  Current Outpatient Prescriptions  Medication Sig Dispense Refill  . albuterol-ipratropium (COMBIVENT) 18-103 MCG/ACT inhaler Inhale 2 puffs into the lungs every 6 (six) hours as needed.  1 Inhaler  6  . aspirin 81 MG tablet Take 81 mg by mouth daily.        . furosemide (LASIX) 40 MG tablet Take 1 tablet (40 mg total) by mouth daily.  30 tablet  3  . hydroxyurea (HYDREA) 500 MG capsule Take 1 capsule (500 mg total) by mouth daily. May take with food to minimize GI side effects.  30 capsule  6  . isosorbide mononitrate (IMDUR) 30 MG 24 hr tablet Take 1 tablet (30 mg total) by mouth daily.  30 tablet  5  . metoprolol succinate (TOPROL-XL) 50 MG 24 hr tablet take 1/4 tablet by mouth twice a day      . Multiple Minerals-Vitamins (CALCIUM & VIT D3 BONE HEALTH PO) Take by mouth daily.       . Multiple Vitamins-Minerals (ANTIOXIDANT) TABS Take by mouth daily.        . nitroGLYCERIN (NITROSTAT) 0.4 MG SL tablet Place 0.4 mg under the tongue every 5 (five) minutes as needed.        . Omega-3 Fatty Acids (FISH OIL) 1000 MG CAPS Take by mouth daily.      . traMADol (ULTRAM) 50 MG tablet 1 q 8 hrs prn  30 tablet  0   No current facility-administered medications for this visit.    PHYSICAL EXAMINATION: ECOG PERFORMANCE STATUS: 1 - Symptomatic but completely ambulatory  Filed Vitals:   05/16/14 1030  BP: 126/60  Pulse: 60  Temp: 98.3 F (36.8 C)  Resp: 17  Filed Weights   05/16/14 1030  Weight: 177 lb 1.6 oz (80.332 kg)    GENERAL:alert, no distress and comfortable SKIN: skin color, texture, turgor are normal, no rashes or significant lesions EYES: normal, Conjunctiva are pink and non-injected, sclera clear HEART: regular rate & rhythm and no murmurs with moderate bilateral lower extremity edema ABDOMEN:abdomen soft, non-tender and normal bowel sounds Musculoskeletal:no cyanosis of digits and no clubbing  NEURO: alert & oriented x 3 with fluent speech, no  focal motor/sensory deficits  LABORATORY DATA:  I have reviewed the data as listed    Component Value Date/Time   NA 143 05/07/2014 1445   NA 142 04/30/2014 1552   K 4.4 05/07/2014 1445   K 4.7 04/30/2014 1552   CL 109 04/30/2014 1552   CO2 28 05/07/2014 1445   CO2 30 04/30/2014 1552   GLUCOSE 102 05/07/2014 1445   GLUCOSE 77 04/30/2014 1552   BUN 21.6 05/07/2014 1445   BUN 21 04/30/2014 1552   CREATININE 1.2 05/07/2014 1445   CREATININE 1.2 04/30/2014 1552   CALCIUM 8.6 05/07/2014 1445   CALCIUM 8.7 04/30/2014 1552   PROT 5.7* 05/07/2014 1445   PROT 6.0 04/24/2014 1700   ALBUMIN 3.3* 05/07/2014 1445   ALBUMIN 3.5 04/24/2014 1700   AST 25 05/07/2014 1445   AST 33 04/24/2014 1700   ALT 18 05/07/2014 1445   ALT 32 04/24/2014 1700   ALKPHOS 103 05/07/2014 1445   ALKPHOS 117 04/24/2014 1700   BILITOT 0.43 05/07/2014 1445   BILITOT 0.5 04/24/2014 1700   GFRNONAA 53* 04/24/2014 1700   GFRAA 61* 04/24/2014 1700    No results found for this basename: SPEP,  UPEP,   kappa and lambda light chains    Lab Results  Component Value Date   WBC 9.5 05/16/2014   NEUTROABS 6.3 05/16/2014   HGB 12.6* 05/16/2014   HCT 39.0 05/16/2014   MCV 83.5 05/16/2014   PLT 929* 05/16/2014      Chemistry      Component Value Date/Time   NA 143 05/07/2014 1445   NA 142 04/30/2014 1552   K 4.4 05/07/2014 1445   K 4.7 04/30/2014 1552   CL 109 04/30/2014 1552   CO2 28 05/07/2014 1445   CO2 30 04/30/2014 1552   BUN 21.6 05/07/2014 1445   BUN 21 04/30/2014 1552   CREATININE 1.2 05/07/2014 1445   CREATININE 1.2 04/30/2014 1552      Component Value Date/Time   CALCIUM 8.6 05/07/2014 1445   CALCIUM 8.7 04/30/2014 1552   ALKPHOS 103 05/07/2014 1445   ALKPHOS 117 04/24/2014 1700   AST 25 05/07/2014 1445   AST 33 04/24/2014 1700   ALT 18 05/07/2014 1445   ALT 32 04/24/2014 1700   BILITOT 0.43 05/07/2014 1445   BILITOT 0.5 04/24/2014 1700     ASSESSMENT & PLAN:  Myeloproliferative disease Peripheral blood detected JAK2 mutation. I suspect he  has essential thrombocytosis. Due to his age and comorbidities, I would not perform bone marrow aspirate and biopsy. He will remain on aspirin. I will increase hydroxyurea to 2 tablets a day in an attempt to bring his platelet count to near normal range.  TIA (transient ischemic attack) This is highly suspicious related to the elevated platelet count. He would continue aspirin therapy.    Leukocytosis, unspecified I suspect this is due to myeloproliferative disorder. Observe only.    Anemia in neoplastic disease This is likely anemia of chronic disease and his underlying bone marrow  disease. The patient denies recent history of bleeding such as epistaxis, hematuria or hematochezia. He is asymptomatic from the anemia. We will observe for now.    Bilateral leg edema I suspect this could be related to heart disease. I will refer him back to his cardiologist for management.    Orders Placed This Encounter  Procedures  . Ambulatory referral to Cardiology    Referral Priority:  Routine    Referral Type:  Consultation    Referral Reason:  Specialty Services Required    Requested Specialty:  Cardiology    Number of Visits Requested:  1   All questions were answered. The patient knows to call the clinic with any problems, questions or concerns. No barriers to learning was detected. I spent 25 minutes counseling the patient face to face. The total time spent in the appointment was 30 minutes and more than 50% was on counseling and review of test results     [facility], [place], MD 05/16/2014 12:47 PM

## 2014-05-16 NOTE — Assessment & Plan Note (Signed)
I suspect this is due to myeloproliferative disorder. Observe only.

## 2014-05-16 NOTE — Patient Instructions (Signed)
Please START taking Furosemide 40mg  daily.  You already have this medication at home.  Your physician recommends that you return for lab work in: [place] ([place])  Your physician has requested that you have an echocardiogram. Echocardiography is a painless test that uses sound waves to create images of your heart. It provides your doctor with information about the size and shape of your heart and how well your heart's chambers and valves are working. This procedure takes approximately one hour. There are no restrictions for this procedure.  Your physician recommends that you schedule a follow-up appointment in: 3 MONTHS with Richardson Dopp PA-C

## 2014-05-16 NOTE — Assessment & Plan Note (Signed)
I suspect this could be related to heart disease. I will refer him back to his cardiologist for management.

## 2014-05-16 NOTE — Progress Notes (Signed)
HPI:  80 year old patient followed by Dr. Angelena Form. The patient has a history of coronary artery disease with previous PCI of the LAD, hypertension, hyperlipidemia, and COPD. He was seen by Richardson Dopp in March 2015 for progressive dyspnea. This was felt related to COPD. A nuclear scan was recommended but it does not appear the patient followed through with this. The patient is followed by hematology for myeloproliferative disease. He is treated with hydroxyurea for thrombocytosis and history of TIA. He was seen earlier today and was noted to have increased leg edema. It was recommended that he evaluated by cardiology because of his leg swelling.  The patient lives alone. He is here with his neighbor today. He doesn't seem to remember why he is here, even though he was referred over to [place] this morning by Dr. Alvy Bimler. The patient reports leg swelling for several months. He does not have pain in his legs. The patient is sedentary. He denies chest pain or pressure. His shortness of breath is unchanged over time. He denies cough. He has no other specific complaints today.  Outpatient Encounter Prescriptions as of 05/16/2014  Medication Sig  . albuterol-ipratropium (COMBIVENT) 18-103 MCG/ACT inhaler Inhale 2 puffs into the lungs every 6 (six) hours as needed.  Marland Kitchen aspirin 81 MG tablet Take 81 mg by mouth daily.    . budesonide-formoterol (SYMBICORT) 160-4.5 MCG/ACT inhaler Inhale 2 puffs into the lungs 2 (two) times daily.  . furosemide (LASIX) 40 MG tablet Take 1 tablet (40 mg total) by mouth daily.  . hydroxyurea (HYDREA) 500 MG capsule Take 1,000 mg by mouth daily. May take with food to minimize GI side effects.  . isosorbide mononitrate (IMDUR) 30 MG 24 hr tablet Take 1 tablet (30 mg total) by mouth daily.  . metoprolol succinate (TOPROL-XL) 50 MG 24 hr tablet take 1/4 tablet by mouth twice a day  . Multiple Minerals-Vitamins (CALCIUM & VIT D3 BONE HEALTH PO) Take by mouth daily.   . Multiple  Vitamins-Minerals (ANTIOXIDANT) TABS Take by mouth daily.    . nitroGLYCERIN (NITROSTAT) 0.4 MG SL tablet Place 0.4 mg under the tongue every 5 (five) minutes as needed.    . Omega-3 Fatty Acids (FISH OIL) 1000 MG CAPS Take by mouth daily.  . traMADol (ULTRAM) 50 MG tablet 1 q 8 hrs prn  . [DISCONTINUED] hydroxyurea (HYDREA) 500 MG capsule Take 1 capsule (500 mg total) by mouth daily. May take with food to minimize GI side effects.    Allergies  Allergen Reactions  . Cefprozil     REACTION: insomnia  . Simvastatin     REACTION: MUSCLE ACHES    Past Medical History  Diagnosis Date  . Loss of height   . Compression fracture   . Hypertension   . Hypercholesterolemia   . Rhinitis   . Dyspnea   . Weight loss   . CAD (coronary artery disease)   . Peptic ulcer disease   . Alcohol abuse   . AMI (acute mesenteric ischemia)   . Colon polyps   . Pleurisy with effusion   . Grief reaction   . Macular degeneration   . Leukocytosis, unspecified 05/07/2014  . Bilateral leg edema 05/07/2014  . Recurrent falls 05/07/2014    ROS: Negative except as per HPI  BP 115/64  Pulse 64  Wt 178 lb (80.74 kg)  PHYSICAL EXAM: Pt is alert and oriented, elderly male with poor recall, in NAD HEENT: normal Neck: JVP - normal, carotids 2+= without bruits  Lungs: Diffuse rhonchi with diminished breath sounds bilaterally CV: RRR without murmur or gallop, heart sounds are distant Abd: soft, NT, Positive BS, no hepatomegaly Ext: 2+ ankle and calf edema bilaterally Skin: warm/dry no rash  ASSESSMENT AND PLAN: 1. Leg edema 2. Coronary artery disease, native vessel without angina 3. Chronic dyspnea, suspect COPD as the primary issue is on his exam findings 4. Essential hypertension, well controlled  Furosemide is on the patient's medication list, but his neighbor went to his house today and looked over his medications. He is apparently not taking this. I advised that he start taking furosemide 40 mg  daily. Would check a metabolic panel in 2 weeks. Will check an echocardiogram to evaluate LV and RV function. His last study from 2011 was reviewed and it was noted to have poor image quality, but his overall cardiac function appeared normal. The patient's memory seems to be a major issue. He should followup with Richardson Dopp in about 3 months and then ongoing cardiac followup with Dr. Angelena Form.  Sherren Mocha 05/16/2014 4:22 PM

## 2014-05-26 ENCOUNTER — Telehealth: Payer: Self-pay | Admitting: Hematology and Oncology

## 2014-05-26 ENCOUNTER — Other Ambulatory Visit: Payer: Self-pay | Admitting: Hematology and Oncology

## 2014-05-26 ENCOUNTER — Ambulatory Visit (HOSPITAL_BASED_OUTPATIENT_CLINIC_OR_DEPARTMENT_OTHER): Payer: Medicare Other | Admitting: Hematology and Oncology

## 2014-05-26 ENCOUNTER — Encounter: Payer: Self-pay | Admitting: Hematology and Oncology

## 2014-05-26 ENCOUNTER — Other Ambulatory Visit (HOSPITAL_BASED_OUTPATIENT_CLINIC_OR_DEPARTMENT_OTHER): Payer: Medicare Other

## 2014-05-26 VITALS — BP 121/64 | HR 70 | Temp 98.3°F | Resp 17 | Ht 67.0 in | Wt 174.2 lb

## 2014-05-26 DIAGNOSIS — D630 Anemia in neoplastic disease: Secondary | ICD-10-CM

## 2014-05-26 DIAGNOSIS — D649 Anemia, unspecified: Secondary | ICD-10-CM

## 2014-05-26 DIAGNOSIS — D47Z9 Other specified neoplasms of uncertain behavior of lymphoid, hematopoietic and related tissue: Secondary | ICD-10-CM

## 2014-05-26 DIAGNOSIS — G459 Transient cerebral ischemic attack, unspecified: Secondary | ICD-10-CM

## 2014-05-26 DIAGNOSIS — D471 Chronic myeloproliferative disease: Secondary | ICD-10-CM

## 2014-05-26 LAB — CBC WITH DIFFERENTIAL/PLATELET
BASO%: 4 % — ABNORMAL HIGH (ref 0.0–2.0)
Basophils Absolute: 0.2 10*3/uL — ABNORMAL HIGH (ref 0.0–0.1)
EOS%: 2.3 % (ref 0.0–7.0)
Eosinophils Absolute: 0.1 10*3/uL (ref 0.0–0.5)
HEMATOCRIT: 36.2 % — AB (ref 38.4–49.9)
HGB: 11.6 g/dL — ABNORMAL LOW (ref 13.0–17.1)
LYMPH%: 28.4 % (ref 14.0–49.0)
MCH: 26.9 pg — AB (ref 27.2–33.4)
MCHC: 32.1 g/dL (ref 32.0–36.0)
MCV: 83.8 fL (ref 79.3–98.0)
MONO#: 0.2 10*3/uL (ref 0.1–0.9)
MONO%: 3.1 % (ref 0.0–14.0)
NEUT#: 3.4 10*3/uL (ref 1.5–6.5)
NEUT%: 62.2 % (ref 39.0–75.0)
Platelets: 629 10*3/uL — ABNORMAL HIGH (ref 140–400)
RBC: 4.32 10*6/uL (ref 4.20–5.82)
RDW: 29.5 % — ABNORMAL HIGH (ref 11.0–14.6)
WBC: 5.4 10*3/uL (ref 4.0–10.3)
lymph#: 1.5 10*3/uL (ref 0.9–3.3)

## 2014-05-26 MED ORDER — HYDROXYUREA 500 MG PO CAPS: 1000.0000 mg | ORAL_CAPSULE | Freq: Every day | ORAL | Status: DC

## 2014-05-26 NOTE — Telephone Encounter (Signed)
Pt confirmed labs/ov per 08/17 POF, gave pt AVS.....KJ °

## 2014-05-26 NOTE — Assessment & Plan Note (Signed)
This is likely anemia of chronic disease and his underlying bone marrow disease. The patient denies recent history of bleeding such as epistaxis, hematuria or hematochezia. He is asymptomatic from the anemia. We will observe for now.   

## 2014-05-26 NOTE — Assessment & Plan Note (Signed)
Peripheral blood detected JAK2 mutation. 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 Due to his age and comorbidities, I would not perform bone marrow aspirate and biopsy. He will remain on aspirin. He tolerated 2 tablets of hydroxyurea a day with improvement of his platelet count. I will continue to monitor him closely with repeat blood work, history and physical examination in 2 weeks.

## 2014-05-26 NOTE — Assessment & Plan Note (Signed)
He is doing well with no further recurrence of TIA

## 2014-05-26 NOTE — Progress Notes (Signed)
[place] OFFICE PROGRESS NOTE  Patient Care Team: Hendricks Limes, MD as PCP - General  SUMMARY OF ONCOLOGIC HISTORY: He was found to have abnormal CBC from recent CBC monitoring when he saw his primary care provider for recurrent falls. This patient had history disease, with angioplasty and stent placement in 2002 and 2005.  In May of this year, he fell at [employer]. After a detailed history, he stated that he had misjudged the distance between his hands and railing and fell. He did not sustain major injury but his CBC showed mild leukocytosis, anemia and very high platelet count, platelet count over 800,000. According to his son, the patient may have transient ischemic attack with mild weakness which subsequently resolved. A week ago, he had accident and had broken ribs. He had CT imaging study which confirmed retractors. Repeat CBC showed platelet count over 1 million. He is hence referred here. 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 The patient was started on hydroxyurea. On 05/16/2014, hydroxyurea is increased to 2 tablets a day INTERVAL HISTORY: Please see below for problem oriented charting. The patient appears forgetful. He states that he is compliant taking hydroxyurea as prescribed. He complains of mild leg edema. The patient denies any recent signs or symptoms of bleeding such as spontaneous epistaxis, hematuria or hematochezia. No new neurological deficits  REVIEW OF SYSTEMS:   Constitutional: Denies fevers, chills or abnormal weight loss Eyes: Denies blurriness of vision Ears, nose, mouth, throat, and face: Denies mucositis or sore throat Respiratory: Denies cough, dyspnea or wheezes Cardiovascular: Denies palpitation, chest discomfort  Gastrointestinal:  Denies nausea, heartburn or change in bowel habits Skin: Denies abnormal skin rashes Lymphatics: Denies new lymphadenopathy Neurological:Denies numbness, tingling or new  weaknesses Behavioral/Psych: Mood is stable, no new changes  All other systems were reviewed with the patient and are negative.  I have reviewed the past medical history, past surgical history, social history and family history with the patient and they are unchanged from previous note.  ALLERGIES:  is allergic to cefprozil and simvastatin.  MEDICATIONS:  Current Outpatient Prescriptions  Medication Sig Dispense Refill  . aspirin 81 MG tablet Take 81 mg by mouth daily.        . budesonide-formoterol (SYMBICORT) 160-4.5 MCG/ACT inhaler Inhale 2 puffs into the lungs 2 (two) times daily.      . furosemide (LASIX) 40 MG tablet Take 1 tablet (40 mg total) by mouth daily.  30 tablet  3  . hydroxyurea (HYDREA) 500 MG capsule Take 2 capsules (1,000 mg total) by mouth daily. May take with food to minimize GI side effects.  60 capsule  3  . isosorbide mononitrate (IMDUR) 30 MG 24 hr tablet Take 1 tablet (30 mg total) by mouth daily.  30 tablet  5  . metoprolol succinate (TOPROL-XL) 50 MG 24 hr tablet take 1/4 tablet by mouth twice a day      . Multiple Minerals-Vitamins (CALCIUM & VIT D3 BONE HEALTH PO) Take by mouth daily.       . Multiple Vitamins-Minerals (ANTIOXIDANT) TABS Take by mouth daily.        . nitroGLYCERIN (NITROSTAT) 0.4 MG SL tablet Place 0.4 mg under the tongue every 5 (five) minutes as needed.        . Omega-3 Fatty Acids (FISH OIL) 1000 MG CAPS Take by mouth daily.      . traMADol (ULTRAM) 50 MG tablet 1 q 8 hrs prn  30 tablet  0  .  albuterol-ipratropium (COMBIVENT) 18-103 MCG/ACT inhaler Inhale 2 puffs into the lungs every 6 (six) hours as needed.  1 Inhaler  6   No current facility-administered medications for this visit.    PHYSICAL EXAMINATION: ECOG PERFORMANCE STATUS: 2 - Symptomatic, <50% confined to bed  Filed Vitals:   05/26/14 1431  BP: 121/64  Pulse: 70  Temp: 98.3 F (36.8 C)  Resp: 17   Filed Weights   05/26/14 1431  Weight: 174 lb 3.2 oz (79.017 kg)     GENERAL:alert, no distress and comfortable SKIN: skin color, texture, turgor are normal, no rashes or significant lesions in the extensive bruises on noted EYES: normal, Conjunctiva are pink and non-injected, sclera clear OROPHARYNX:no exudate, no erythema and lips, buccal mucosa, and tongue normal  NECK: supple, thyroid normal size, non-tender, without nodularity LYMPH:  no palpable lymphadenopathy in the cervical, axillary or inguinal LUNGS: clear to auscultation and percussion with normal breathing effort HEART: regular rate & rhythm and no murmurs mild bilateral lower extremity edema ABDOMEN:abdomen soft, non-tender and normal bowel sounds Musculoskeletal:no cyanosis of digits and no clubbing  NEURO: alert & oriented x 3 with fluent speech, no focal motor/sensory deficits. He appears to have poor memory  LABORATORY DATA:  I have reviewed the data as listed    Component Value Date/Time   NA 143 05/07/2014 1445   NA 142 04/30/2014 1552   K 4.4 05/07/2014 1445   K 4.7 04/30/2014 1552   CL 109 04/30/2014 1552   CO2 28 05/07/2014 1445   CO2 30 04/30/2014 1552   GLUCOSE 102 05/07/2014 1445   GLUCOSE 77 04/30/2014 1552   BUN 21.6 05/07/2014 1445   BUN 21 04/30/2014 1552   CREATININE 1.2 05/07/2014 1445   CREATININE 1.2 04/30/2014 1552   CALCIUM 8.6 05/07/2014 1445   CALCIUM 8.7 04/30/2014 1552   PROT 5.7* 05/07/2014 1445   PROT 6.0 04/24/2014 1700   ALBUMIN 3.3* 05/07/2014 1445   ALBUMIN 3.5 04/24/2014 1700   AST 25 05/07/2014 1445   AST 33 04/24/2014 1700   ALT 18 05/07/2014 1445   ALT 32 04/24/2014 1700   ALKPHOS 103 05/07/2014 1445   ALKPHOS 117 04/24/2014 1700   BILITOT 0.43 05/07/2014 1445   BILITOT 0.5 04/24/2014 1700   GFRNONAA 53* 04/24/2014 1700   GFRAA 61* 04/24/2014 1700    No results found for this basename: SPEP,  UPEP,   kappa and lambda light chains    Lab Results  Component Value Date   WBC 5.4 05/26/2014   NEUTROABS 3.4 05/26/2014   HGB 11.6* 05/26/2014   HCT 36.2*  05/26/2014   MCV 83.8 05/26/2014   PLT 629* 05/26/2014      Chemistry      Component Value Date/Time   NA 143 05/07/2014 1445   NA 142 04/30/2014 1552   K 4.4 05/07/2014 1445   K 4.7 04/30/2014 1552   CL 109 04/30/2014 1552   CO2 28 05/07/2014 1445   CO2 30 04/30/2014 1552   BUN 21.6 05/07/2014 1445   BUN 21 04/30/2014 1552   CREATININE 1.2 05/07/2014 1445   CREATININE 1.2 04/30/2014 1552      Component Value Date/Time   CALCIUM 8.6 05/07/2014 1445   CALCIUM 8.7 04/30/2014 1552   ALKPHOS 103 05/07/2014 1445   ALKPHOS 117 04/24/2014 1700   AST 25 05/07/2014 1445   AST 33 04/24/2014 1700   ALT 18 05/07/2014 1445   ALT 32 04/24/2014 1700   BILITOT 0.43 05/07/2014 1445  BILITOT 0.5 04/24/2014 1700       ASSESSMENT & PLAN:  Myeloproliferative disease Peripheral blood detected JAK2 mutation. 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 Due to his age and comorbidities, I would not perform bone marrow aspirate and biopsy. He will remain on aspirin. He tolerated 2 tablets of hydroxyurea a day with improvement of his platelet count. I will continue to monitor him closely with repeat blood work, history and physical examination in 2 weeks.  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 The patient denies recent history of bleeding such as epistaxis, hematuria or hematochezia. He is asymptomatic from the anemia. We will observe for now.        No orders of the defined types were placed in this encounter.   All questions were answered. The patient knows to call the clinic with any problems, questions or concerns. No barriers to learning was detected. I spent 15 minutes counseling the patient face to face. The total time spent in the appointment was 20 minutes and more than 50% was on counseling and review of test results     [facility], [place],  MD 05/26/2014 8:34 PM

## 2014-05-30 ENCOUNTER — Ambulatory Visit (HOSPITAL_COMMUNITY): Payer: Medicare Other | Attending: Cardiovascular Disease | Admitting: Cardiology

## 2014-05-30 ENCOUNTER — Other Ambulatory Visit (INDEPENDENT_AMBULATORY_CARE_PROVIDER_SITE_OTHER): Payer: Medicare Other

## 2014-05-30 DIAGNOSIS — I10 Essential (primary) hypertension: Secondary | ICD-10-CM

## 2014-05-30 DIAGNOSIS — R0989 Other specified symptoms and signs involving the circulatory and respiratory systems: Principal | ICD-10-CM | POA: Insufficient documentation

## 2014-05-30 DIAGNOSIS — R0602 Shortness of breath: Secondary | ICD-10-CM

## 2014-05-30 DIAGNOSIS — R0609 Other forms of dyspnea: Secondary | ICD-10-CM | POA: Diagnosis present

## 2014-05-30 DIAGNOSIS — R609 Edema, unspecified: Secondary | ICD-10-CM

## 2014-05-30 NOTE — Progress Notes (Signed)
Echo performed. 

## 2014-05-31 LAB — BASIC METABOLIC PANEL
BUN: 23 mg/dL (ref 6–23)
CALCIUM: 8.5 mg/dL (ref 8.4–10.5)
CO2: 29 mEq/L (ref 19–32)
CREATININE: 1.2 mg/dL (ref 0.4–1.5)
Chloride: 104 mEq/L (ref 96–112)
GFR: 62.68 mL/min (ref >60.00)
Glucose, Bld: 74 mg/dL (ref 70–99)
Potassium: 4.4 mEq/L (ref 3.5–5.1)
Sodium: 139 mEq/L (ref 135–145)

## 2014-06-04 ENCOUNTER — Other Ambulatory Visit: Payer: Self-pay | Admitting: Cardiology

## 2014-06-06 ENCOUNTER — Telehealth: Payer: Self-pay | Admitting: Cardiovascular Disease

## 2014-06-06 NOTE — Telephone Encounter (Signed)
Notified Ivin Booty (care giver) of echo results and labs.

## 2014-06-06 NOTE — Telephone Encounter (Signed)
New message      Returning a nurses call.  Please call after 3pm

## 2014-06-09 ENCOUNTER — Ambulatory Visit (HOSPITAL_BASED_OUTPATIENT_CLINIC_OR_DEPARTMENT_OTHER): Payer: Medicare Other | Admitting: Hematology and Oncology

## 2014-06-09 ENCOUNTER — Telehealth: Payer: Self-pay | Admitting: Hematology and Oncology

## 2014-06-09 ENCOUNTER — Encounter: Payer: Self-pay | Admitting: Hematology and Oncology

## 2014-06-09 ENCOUNTER — Other Ambulatory Visit (HOSPITAL_BASED_OUTPATIENT_CLINIC_OR_DEPARTMENT_OTHER): Payer: Medicare Other

## 2014-06-09 VITALS — BP 121/56 | HR 64 | Temp 98.3°F | Resp 18 | Ht 67.0 in | Wt 176.8 lb

## 2014-06-09 DIAGNOSIS — D649 Anemia, unspecified: Secondary | ICD-10-CM

## 2014-06-09 DIAGNOSIS — D701 Agranulocytosis secondary to cancer chemotherapy: Secondary | ICD-10-CM | POA: Insufficient documentation

## 2014-06-09 DIAGNOSIS — D72819 Decreased white blood cell count, unspecified: Secondary | ICD-10-CM

## 2014-06-09 DIAGNOSIS — D47Z9 Other specified neoplasms of uncertain behavior of lymphoid, hematopoietic and related tissue: Secondary | ICD-10-CM

## 2014-06-09 DIAGNOSIS — I959 Hypotension, unspecified: Secondary | ICD-10-CM | POA: Insufficient documentation

## 2014-06-09 DIAGNOSIS — G459 Transient cerebral ischemic attack, unspecified: Secondary | ICD-10-CM

## 2014-06-09 DIAGNOSIS — D630 Anemia in neoplastic disease: Secondary | ICD-10-CM

## 2014-06-09 DIAGNOSIS — D471 Chronic myeloproliferative disease: Secondary | ICD-10-CM

## 2014-06-09 DIAGNOSIS — T451X5A Adverse effect of antineoplastic and immunosuppressive drugs, initial encounter: Secondary | ICD-10-CM

## 2014-06-09 DIAGNOSIS — R609 Edema, unspecified: Secondary | ICD-10-CM

## 2014-06-09 DIAGNOSIS — I952 Hypotension due to drugs: Secondary | ICD-10-CM

## 2014-06-09 LAB — CBC WITH DIFFERENTIAL/PLATELET
BASO%: 0.7 % (ref 0.0–2.0)
Basophils Absolute: 0 10*3/uL (ref 0.0–0.1)
EOS ABS: 0.1 10*3/uL (ref 0.0–0.5)
EOS%: 2.8 % (ref 0.0–7.0)
HCT: 31.8 % — ABNORMAL LOW (ref 38.4–49.9)
HGB: 10.4 g/dL — ABNORMAL LOW (ref 13.0–17.1)
LYMPH%: 43.5 % (ref 14.0–49.0)
MCH: 26.9 pg — ABNORMAL LOW (ref 27.2–33.4)
MCHC: 32.7 g/dL (ref 32.0–36.0)
MCV: 82.4 fL (ref 79.3–98.0)
MONO#: 0.2 10*3/uL (ref 0.1–0.9)
MONO%: 7.1 % (ref 0.0–14.0)
NEUT%: 45.9 % (ref 39.0–75.0)
NEUTROS ABS: 1.3 10*3/uL — AB (ref 1.5–6.5)
RBC: 3.86 10*6/uL — ABNORMAL LOW (ref 4.20–5.82)
RDW: 26.4 % — ABNORMAL HIGH (ref 11.0–14.6)
WBC: 2.8 10*3/uL — ABNORMAL LOW (ref 4.0–10.3)
lymph#: 1.2 10*3/uL (ref 0.9–3.3)
nRBC: 0 % (ref 0–0)

## 2014-06-09 LAB — TECHNOLOGIST REVIEW

## 2014-06-09 NOTE — Telephone Encounter (Signed)
gv and printed appt schedand avs for pt for Sept °

## 2014-06-09 NOTE — Assessment & Plan Note (Signed)
He is doing well with no further recurrence of TIA

## 2014-06-09 NOTE — Assessment & Plan Note (Signed)
Peripheral blood detected JAK2 mutation. 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 Due to his age and comorbidities, I would not perform bone marrow aspirate and biopsy. He will remain on aspirin. He tolerated 2 tablets of hydroxyurea a day with improvement of his platelet count, however, repeat blood tests today show progressive leukopenia and anemia. I recommend reducing hydroxyurea down to 1 tablet a day. I will continue to monitor him closely with repeat blood work, history and physical examination in 2 weeks.

## 2014-06-09 NOTE — Assessment & Plan Note (Signed)
He is on multiple blood pressure medications and appears symptomatic. I recommend he has return followup with PCP or cardiologist for medication adjustment.

## 2014-06-09 NOTE — Progress Notes (Signed)
[place] OFFICE PROGRESS NOTE  Patient Care Team: Hendricks Limes, MD as PCP - General  SUMMARY OF ONCOLOGIC HISTORY: He was found to have abnormal CBC from recent CBC monitoring when he saw his primary care provider for recurrent falls. This patient had history disease, with angioplasty and stent placement in 2002 and 2005.  In May of this year, he fell at [employer]. After a detailed history, he stated that he had misjudged the distance between his hands and railing and fell. He did not sustain major injury but his CBC showed mild leukocytosis, anemia and very high platelet count, platelet count over 800,000. According to his son, the patient may have transient ischemic attack with mild weakness which subsequently resolved. A week ago, he had accident and had broken ribs. He had CT imaging study which confirmed retractors. Repeat CBC showed platelet count over 1 million. He is hence referred here. 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 The patient was started on hydroxyurea. On 05/16/2014, hydroxyurea is increased to 2 tablets a day On 06/09/2014, hydroxyurea dose is reduced back to one tablet per day. INTERVAL HISTORY: Please see below for problem oriented charting. He complained of some occasional dizziness, postural related. The patient denies any recent signs or symptoms of bleeding such as spontaneous epistaxis, hematuria or hematochezia. Denies recent infection. Denies recurrence of neurological deficits. He complained of persistent leg edema.  REVIEW OF SYSTEMS:   Constitutional: Denies fevers, chills or abnormal weight loss Eyes: Denies blurriness of vision Ears, nose, mouth, throat, and face: Denies mucositis or sore throat Respiratory: Denies cough, dyspnea or wheezes Cardiovascular: Denies palpitation, chest discomfort Gastrointestinal:  Denies nausea, heartburn or change in bowel habits Skin: Denies abnormal skin rashes Lymphatics: Denies  new lymphadenopathy or easy bruising Neurological:Denies numbness, tingling or new weaknesses Behavioral/Psych: Mood is stable, no new changes  All other systems were reviewed with the patient and are negative.  I have reviewed the past medical history, past surgical history, social history and family history with the patient and they are unchanged from previous note.  ALLERGIES:  is allergic to cefprozil and simvastatin.  MEDICATIONS:  Current Outpatient Prescriptions  Medication Sig Dispense Refill  . aspirin 81 MG tablet Take 81 mg by mouth daily.        . budesonide-formoterol (SYMBICORT) 160-4.5 MCG/ACT inhaler Inhale 2 puffs into the lungs 2 (two) times daily.      . furosemide (LASIX) 40 MG tablet Take 1 tablet (40 mg total) by mouth daily.  30 tablet  3  . hydroxyurea (HYDREA) 500 MG capsule Take 2 capsules (1,000 mg total) by mouth daily. May take with food to minimize GI side effects.  60 capsule  3  . isosorbide mononitrate (IMDUR) 30 MG 24 hr tablet take 1 tablet by mouth once daily  30 tablet  3  . metoprolol succinate (TOPROL-XL) 50 MG 24 hr tablet take 1/4 tablet by mouth twice a day      . Multiple Minerals-Vitamins (CALCIUM & VIT D3 BONE HEALTH PO) Take by mouth daily.       . Multiple Vitamins-Minerals (ANTIOXIDANT) TABS Take by mouth daily.        . Omega-3 Fatty Acids (FISH OIL) 1000 MG CAPS Take by mouth daily.      Marland Kitchen albuterol-ipratropium (COMBIVENT) 18-103 MCG/ACT inhaler Inhale 2 puffs into the lungs every 6 (six) hours as needed.  1 Inhaler  6  . nitroGLYCERIN (NITROSTAT) 0.4 MG SL tablet Place 0.4 mg  under the tongue every 5 (five) minutes as needed.        . traMADol (ULTRAM) 50 MG tablet 1 q 8 hrs prn  30 tablet  0   No current facility-administered medications for this visit.    PHYSICAL EXAMINATION: ECOG PERFORMANCE STATUS: 1 - Symptomatic but completely ambulatory  Filed Vitals:   06/09/14 1432  BP: 121/56  Pulse: 64  Temp: 98.3 F (36.8 C)  Resp:  18   Filed Weights   06/09/14 1432  Weight: 176 lb 12.8 oz (80.196 kg)    GENERAL:alert, no distress and comfortable SKIN: skin color, texture, turgor are normal, no rashes or significant lesions EYES: normal, Conjunctiva are pink and non-injected, sclera clear OROPHARYNX:no exudate, no erythema and lips, buccal mucosa, and tongue normal  NECK: supple, thyroid normal size, non-tender, without nodularity LYMPH:  no palpable lymphadenopathy in the cervical, axillary or inguinal LUNGS: clear to auscultation and percussion with normal breathing effort HEART: regular rate & rhythm and no murmurs with moderate bilateral lower extremity edema ABDOMEN:abdomen soft, non-tender and normal bowel sounds Musculoskeletal:no cyanosis of digits and no clubbing  NEURO: alert & oriented x 3 with fluent speech, no focal motor/sensory deficits  LABORATORY DATA:  I have reviewed the data as listed    Component Value Date/Time   NA 139 05/30/2014 1345   NA 143 05/07/2014 1445   K 4.4 05/30/2014 1345   K 4.4 05/07/2014 1445   CL 104 05/30/2014 1345   CO2 29 05/30/2014 1345   CO2 28 05/07/2014 1445   GLUCOSE 74 05/30/2014 1345   GLUCOSE 102 05/07/2014 1445   BUN 23 05/30/2014 1345   BUN 21.6 05/07/2014 1445   CREATININE 1.2 05/30/2014 1345   CREATININE 1.2 05/07/2014 1445   CALCIUM 8.5 05/30/2014 1345   CALCIUM 8.6 05/07/2014 1445   PROT 5.7* 05/07/2014 1445   PROT 6.0 04/24/2014 1700   ALBUMIN 3.3* 05/07/2014 1445   ALBUMIN 3.5 04/24/2014 1700   AST 25 05/07/2014 1445   AST 33 04/24/2014 1700   ALT 18 05/07/2014 1445   ALT 32 04/24/2014 1700   ALKPHOS 103 05/07/2014 1445   ALKPHOS 117 04/24/2014 1700   BILITOT 0.43 05/07/2014 1445   BILITOT 0.5 04/24/2014 1700   GFRNONAA 53* 04/24/2014 1700   GFRAA 61* 04/24/2014 1700    No results found for this basename: SPEP,  UPEP,   kappa and lambda light chains    Lab Results  Component Value Date   WBC 2.8* 06/09/2014   NEUTROABS 1.3* 06/09/2014   HGB 10.4* 06/09/2014    HCT 31.8* 06/09/2014   MCV 82.4 06/09/2014   PLT 190 Large & giant platelets 06/09/2014      Chemistry      Component Value Date/Time   NA 139 05/30/2014 1345   NA 143 05/07/2014 1445   K 4.4 05/30/2014 1345   K 4.4 05/07/2014 1445   CL 104 05/30/2014 1345   CO2 29 05/30/2014 1345   CO2 28 05/07/2014 1445   BUN 23 05/30/2014 1345   BUN 21.6 05/07/2014 1445   CREATININE 1.2 05/30/2014 1345   CREATININE 1.2 05/07/2014 1445      Component Value Date/Time   CALCIUM 8.5 05/30/2014 1345   CALCIUM 8.6 05/07/2014 1445   ALKPHOS 103 05/07/2014 1445   ALKPHOS 117 04/24/2014 1700   AST 25 05/07/2014 1445   AST 33 04/24/2014 1700   ALT 18 05/07/2014 1445   ALT 32 04/24/2014 1700   BILITOT 0.43  05/07/2014 1445   BILITOT 0.5 04/24/2014 1700     ASSESSMENT & PLAN:  Myeloproliferative disease Peripheral blood detected JAK2 mutation. 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 Due to his age and comorbidities, I would not perform bone marrow aspirate and biopsy. He will remain on aspirin. He tolerated 2 tablets of hydroxyurea a day with improvement of his platelet count, however, repeat blood tests today show progressive leukopenia and anemia. I recommend reducing hydroxyurea down to 1 tablet a day. I will continue to monitor him closely with repeat blood work, history and physical examination in 2 weeks.    TIA (transient ischemic attack) He is doing well with no further recurrence of TIA    Anemia in neoplastic disease This is likely anemia of chronic disease and his underlying bone marrow disease. The patient denies recent history of bleeding such as epistaxis, hematuria or hematochezia. He is asymptomatic from the anemia. We will observe for now.        Leukopenia due to antineoplastic chemotherapy This is likely due to recent treatment. The patient denies recent history of fevers, cough, chills, diarrhea or dysuria. He is asymptomatic from the leukopenia. I will observe for now.  As above, I  plan to reduce hydroxyurea back to one tablet per day.  EDEMA- LOCALIZED I suspect this could be related to heart disease. I will refer him back to his cardiologist for management.    Hypotension He is on multiple blood pressure medications and appears symptomatic. I recommend he has return followup with PCP or cardiologist for medication adjustment.    No orders of the defined types were placed in this encounter.   All questions were answered. The patient knows to call the clinic with any problems, questions or concerns. No barriers to learning was detected. I spent 25 minutes counseling the patient face to face. The total time spent in the appointment was 30 minutes and more than 50% was on counseling and review of test results     [facility], [place], MD 06/09/2014 4:03 PM

## 2014-06-09 NOTE — Assessment & Plan Note (Signed)
I suspect this could be related to heart disease. I will refer him back to his cardiologist for management.

## 2014-06-09 NOTE — Assessment & Plan Note (Signed)
This is likely anemia of chronic disease and his underlying bone marrow disease. The patient denies recent history of bleeding such as epistaxis, hematuria or hematochezia. He is asymptomatic from the anemia. We will observe for now.   

## 2014-06-09 NOTE — Assessment & Plan Note (Signed)
This is likely due to recent treatment. The patient denies recent history of fevers, cough, chills, diarrhea or dysuria. He is asymptomatic from the leukopenia. I will observe for now.  As above, I plan to reduce hydroxyurea back to one tablet per day.

## 2014-06-11 ENCOUNTER — Other Ambulatory Visit: Payer: Self-pay | Admitting: Cardiology

## 2014-06-23 ENCOUNTER — Ambulatory Visit (HOSPITAL_BASED_OUTPATIENT_CLINIC_OR_DEPARTMENT_OTHER): Payer: Medicare Other | Admitting: Hematology and Oncology

## 2014-06-23 ENCOUNTER — Telehealth: Payer: Self-pay | Admitting: Hematology and Oncology

## 2014-06-23 ENCOUNTER — Other Ambulatory Visit (HOSPITAL_BASED_OUTPATIENT_CLINIC_OR_DEPARTMENT_OTHER): Payer: Medicare Other

## 2014-06-23 VITALS — BP 133/50 | HR 60 | Temp 97.9°F | Resp 18 | Ht 67.0 in | Wt 179.7 lb

## 2014-06-23 DIAGNOSIS — D72819 Decreased white blood cell count, unspecified: Secondary | ICD-10-CM

## 2014-06-23 DIAGNOSIS — D47Z9 Other specified neoplasms of uncertain behavior of lymphoid, hematopoietic and related tissue: Secondary | ICD-10-CM

## 2014-06-23 DIAGNOSIS — D471 Chronic myeloproliferative disease: Secondary | ICD-10-CM

## 2014-06-23 DIAGNOSIS — D630 Anemia in neoplastic disease: Secondary | ICD-10-CM

## 2014-06-23 DIAGNOSIS — T451X5A Adverse effect of antineoplastic and immunosuppressive drugs, initial encounter: Secondary | ICD-10-CM

## 2014-06-23 DIAGNOSIS — D649 Anemia, unspecified: Secondary | ICD-10-CM

## 2014-06-23 DIAGNOSIS — Z23 Encounter for immunization: Secondary | ICD-10-CM

## 2014-06-23 DIAGNOSIS — D701 Agranulocytosis secondary to cancer chemotherapy: Secondary | ICD-10-CM

## 2014-06-23 LAB — CBC WITH DIFFERENTIAL/PLATELET
BASO%: 1 % (ref 0.0–2.0)
BASOS ABS: 0 10*3/uL (ref 0.0–0.1)
EOS ABS: 0.1 10*3/uL (ref 0.0–0.5)
EOS%: 2.5 % (ref 0.0–7.0)
HCT: 29.4 % — ABNORMAL LOW (ref 38.4–49.9)
HEMOGLOBIN: 9.5 g/dL — AB (ref 13.0–17.1)
LYMPH%: 41.4 % (ref 14.0–49.0)
MCH: 28 pg (ref 27.2–33.4)
MCHC: 32.5 g/dL (ref 32.0–36.0)
MCV: 86.2 fL (ref 79.3–98.0)
MONO#: 0.3 10*3/uL (ref 0.1–0.9)
MONO%: 9.8 % (ref 0.0–14.0)
NEUT%: 45.3 % (ref 39.0–75.0)
NEUTROS ABS: 1.4 10*3/uL — AB (ref 1.5–6.5)
PLATELETS: 218 10*3/uL (ref 140–400)
RBC: 3.41 10*6/uL — ABNORMAL LOW (ref 4.20–5.82)
RDW: 30.3 % — ABNORMAL HIGH (ref 11.0–14.6)
WBC: 3.2 10*3/uL — ABNORMAL LOW (ref 4.0–10.3)
lymph#: 1.3 10*3/uL (ref 0.9–3.3)

## 2014-06-23 MED ORDER — INFLUENZA VAC SPLIT QUAD 0.5 ML IM SUSY
0.5000 mL | PREFILLED_SYRINGE | Freq: Once | INTRAMUSCULAR | Status: AC
  Administered 2014-06-23: 0.5 mL via INTRAMUSCULAR
  Filled 2014-06-23: qty 0.5

## 2014-06-23 MED ORDER — HYDROXYUREA 500 MG PO CAPS: 500.0000 mg | ORAL_CAPSULE | Freq: Every day | ORAL | Status: DC

## 2014-06-23 NOTE — Assessment & Plan Note (Signed)
This is likely anemia of chronic disease and his underlying bone marrow disease. The patient denies recent history of bleeding such as epistaxis, hematuria or hematochezia. He is asymptomatic from the anemia. We will observe for now.   

## 2014-06-23 NOTE — Telephone Encounter (Signed)
gv pt appt schedule for oct.  °

## 2014-06-23 NOTE — Assessment & Plan Note (Signed)
Peripheral blood detected JAK2 mutation. 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 Due to his age and comorbidities, I would not perform bone marrow aspirate and biopsy. He will remain on aspirin. He tolerated 2 tablets of hydroxyurea a day with improvement of his platelet count, however, repeat blood tests today show progressive leukopenia and anemia. I recommend reducing hydroxyurea down to 1 tablet a day. His blood count is slightly improved. I will continue to monitor him closely with repeat blood work, history and physical examination in 4 weeks.

## 2014-06-23 NOTE — Assessment & Plan Note (Signed)
This is likely due to recent treatment. The patient denies recent history of fevers, cough, chills, diarrhea or dysuria. He is asymptomatic from the leukopenia. I will observe for now.  As above, I plan to continue hydroxyurea at one tablet per day.
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
## 2014-07-17 ENCOUNTER — Ambulatory Visit: Payer: Self-pay | Admitting: Internal Medicine

## 2014-07-17 ENCOUNTER — Ambulatory Visit (INDEPENDENT_AMBULATORY_CARE_PROVIDER_SITE_OTHER): Payer: Medicare Other | Admitting: Internal Medicine

## 2014-07-17 ENCOUNTER — Encounter: Payer: Self-pay | Admitting: Internal Medicine

## 2014-07-17 VITALS — BP 108/60 | HR 61 | Temp 97.6°F | Wt 172.0 lb

## 2014-07-17 DIAGNOSIS — R4689 Other symptoms and signs involving appearance and behavior: Secondary | ICD-10-CM | POA: Insufficient documentation

## 2014-07-17 DIAGNOSIS — F32A Depression, unspecified: Secondary | ICD-10-CM

## 2014-07-17 DIAGNOSIS — F329 Major depressive disorder, single episode, unspecified: Secondary | ICD-10-CM

## 2014-07-17 DIAGNOSIS — R2681 Unsteadiness on feet: Secondary | ICD-10-CM

## 2014-07-17 MED ORDER — SERTRALINE HCL 25 MG PO TABS: 25.0000 mg | ORAL_TABLET | Freq: Every day | ORAL | Status: DC
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
## 2014-07-17 NOTE — Progress Notes (Signed)
Pre visit review using our clinic review tool, if applicable. No additional management support is needed unless otherwise documented below in the visit note. 
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
## 2014-07-17 NOTE — Patient Instructions (Signed)
The Physical Therapy referral will be scheduled and you'll be notified of the time.Please call the Referral Co-Ordinator @ [phone] if you have not been notified of appointment time within 7-10 days. As we discussed the neurotransmitters are essential for good brain function, both intellectually & emotionally. The agents to increase the neurotransmitter levels are not addictive and simply keep this essential neurotransmitter at therapeutic levels. If these levels become severely depleted; depression or panic attacks can occur.

## 2014-07-22 ENCOUNTER — Ambulatory Visit: Payer: Medicare Other | Admitting: Hematology and Oncology

## 2014-07-22 ENCOUNTER — Telehealth: Payer: Self-pay | Admitting: *Deleted

## 2014-07-22 ENCOUNTER — Other Ambulatory Visit: Payer: Medicare Other

## 2014-07-22 NOTE — Telephone Encounter (Signed)
[facility] Triage Call Report Triage Record Num: [id number] Operator: Amada Kingfisher Patient Name: Daniel Ali Call Date & Time: 07/16/2014 6:10:26PM Patient Phone: [phone] PCP: Unice Cobble Patient Gender: Male PCP Fax : [phone] Patient DOB: Jul 17, 1925 Practice Name: [facility] Reason for Call: Caller: Steve/Other; PCP: Unice Cobble; CB#: [phone]; Call regarding Depression; hasn't slept in the past four days; has a neighbor that was checking on him is sick; neighbors feel like he is ok; son says he seems different; not sure if he is exagerrating, which he does at times; doesn't feel that he is self destructive; worried he will drive or fall; says he is getting Alzheimers and son is in the process of trying to move to the area; says he is slurring his words, but doesn't feel he is drinking; All emergent sxs of Depression protocol r/o except "new or increasing sxs"; disp see within 24hrs; appt scheduled for 10/8 at 1000 with Dr.Hopper Protocol(s) Used: Depression Recommended Outcome per Protocol: See Provider within 24 hours Reason for Outcome: New or increasing symptoms Care Advice: ~ SYMPTOM / CONDITION MANAGEMENT 10/

## 2014-07-29 ENCOUNTER — Ambulatory Visit (INDEPENDENT_AMBULATORY_CARE_PROVIDER_SITE_OTHER): Payer: Medicare Other | Admitting: Internal Medicine

## 2014-07-29 ENCOUNTER — Encounter: Payer: Self-pay | Admitting: Internal Medicine

## 2014-07-29 VITALS — BP 100/50 | HR 55 | Temp 98.3°F | Resp 14 | Wt 167.4 lb

## 2014-07-29 DIAGNOSIS — F32A Depression, unspecified: Secondary | ICD-10-CM

## 2014-07-29 DIAGNOSIS — R627 Adult failure to thrive: Secondary | ICD-10-CM

## 2014-07-29 DIAGNOSIS — F329 Major depressive disorder, single episode, unspecified: Secondary | ICD-10-CM

## 2014-07-29 MED ORDER — SERTRALINE HCL 50 MG PO TABS: 50.0000 mg | ORAL_TABLET | Freq: Every day | ORAL | Status: DC

## 2014-07-29 NOTE — Patient Instructions (Signed)
The Home Health Nurse referral will be scheduled and you'll be notified of the time.Please call the Referral Co-Ordinator @ [phone] if you have not been notified of appointment time within 7-10 days.

## 2014-07-29 NOTE — Progress Notes (Signed)
   Subjective:    Patient ID: Daniel Ali, male    DOB: [birth year], 80 y.o.   MRN: [id number]  HPI   He is here for followup of his depression. He feels that he may be 15% improved on sertraline 25 mg daily  He denies suicidal ideation but did report that he would "run into the street" if he were considering this. He then made a comment that his  "cane would prevent (him from doing) that."  He denies any additional falls since last visit. No Physical Therapy appointment yet.    Review of Systems   I have been in communication by E mails with his son who is healthcare power of attorney as well as his neighbor who is a Marine scientist. They as I would prefer he not drive; a driving test may be considered. His neighbor reports altered behavior with change in mood with associated isolation..     Objective:   Physical Exam   He is alert but unable to give me the month & the day. He did know the year & President  Breath sounds are markedly decreased but there is no increased work of breathing  He has a slow S4 without murmur or gallop  Pedal pulses are decreased.  He does ambulate with a cane.  OA joint changes present.        Assessment & Plan:  #1 depression #2 advanced DJD #3 adult failure to thrive  Plan: Home Health Nurse assessment of the home situation I have recommended all 3 adult children meet with him after this report is rendered to assess best long term options for his care Increase sertraline to 50 mg daily

## 2014-07-29 NOTE — Progress Notes (Signed)
Pre visit review using our clinic review tool, if applicable. No additional management support is needed unless otherwise documented below in the visit note. 

## 2014-08-01 ENCOUNTER — Encounter (HOSPITAL_COMMUNITY): Payer: Self-pay | Admitting: Emergency Medicine

## 2014-08-01 ENCOUNTER — Ambulatory Visit: Payer: Medicare Other | Attending: Internal Medicine

## 2014-08-01 ENCOUNTER — Ambulatory Visit: Payer: Medicare Other

## 2014-08-01 ENCOUNTER — Emergency Department (HOSPITAL_COMMUNITY)
Admission: EM | Admit: 2014-08-01 | Discharge: 2014-08-01 | Disposition: A | Discharge status: Home / Self Care | Payer: Medicare Other | Attending: Emergency Medicine | Admitting: Emergency Medicine

## 2014-08-01 DIAGNOSIS — Z8711 Personal history of peptic ulcer disease: Secondary | ICD-10-CM | POA: Diagnosis not present

## 2014-08-01 DIAGNOSIS — I10 Essential (primary) hypertension: Secondary | ICD-10-CM | POA: Insufficient documentation

## 2014-08-01 DIAGNOSIS — Z79899 Other long term (current) drug therapy: Secondary | ICD-10-CM | POA: Diagnosis not present

## 2014-08-01 DIAGNOSIS — Z8781 Personal history of (healed) traumatic fracture: Secondary | ICD-10-CM | POA: Insufficient documentation

## 2014-08-01 DIAGNOSIS — Z862 Personal history of diseases of the blood and blood-forming organs and certain disorders involving the immune mechanism: Secondary | ICD-10-CM | POA: Diagnosis not present

## 2014-08-01 DIAGNOSIS — Z9889 Other specified postprocedural states: Secondary | ICD-10-CM | POA: Diagnosis not present

## 2014-08-01 DIAGNOSIS — F32A Depression, unspecified: Secondary | ICD-10-CM

## 2014-08-01 DIAGNOSIS — Z7982 Long term (current) use of aspirin: Secondary | ICD-10-CM | POA: Diagnosis not present

## 2014-08-01 DIAGNOSIS — Z87891 Personal history of nicotine dependence: Secondary | ICD-10-CM | POA: Insufficient documentation

## 2014-08-01 DIAGNOSIS — I2510 Atherosclerotic heart disease of native coronary artery without angina pectoris: Secondary | ICD-10-CM | POA: Insufficient documentation

## 2014-08-01 DIAGNOSIS — Z86010 Personal history of colonic polyps: Secondary | ICD-10-CM | POA: Insufficient documentation

## 2014-08-01 DIAGNOSIS — Z8719 Personal history of other diseases of the digestive system: Secondary | ICD-10-CM | POA: Diagnosis not present

## 2014-08-01 DIAGNOSIS — Z8669 Personal history of other diseases of the nervous system and sense organs: Secondary | ICD-10-CM | POA: Diagnosis not present

## 2014-08-01 DIAGNOSIS — E780 Pure hypercholesterolemia: Secondary | ICD-10-CM | POA: Diagnosis not present

## 2014-08-01 DIAGNOSIS — Z7951 Long term (current) use of inhaled steroids: Secondary | ICD-10-CM | POA: Diagnosis not present

## 2014-08-01 DIAGNOSIS — F329 Major depressive disorder, single episode, unspecified: Secondary | ICD-10-CM | POA: Diagnosis present

## 2014-08-01 DIAGNOSIS — Z9861 Coronary angioplasty status: Secondary | ICD-10-CM | POA: Insufficient documentation

## 2014-08-01 DIAGNOSIS — Z8709 Personal history of other diseases of the respiratory system: Secondary | ICD-10-CM | POA: Insufficient documentation

## 2014-08-01 NOTE — ED Provider Notes (Signed)
CSN: [id number]     Arrival date & time 08/01/14  1406 History   First MD Initiated Contact with Patient 08/01/14 1729     Chief Complaint  Patient presents with  . Depression     (Consider location/radiation/quality/duration/timing/severity/associated sxs/prior Treatment) HPI 80 year old male presents for evaluation of depression. He's been depressed for 3-4 months. He states that one month ago his son died of cancer. His wife died 55 years ago it is oral friend died last month as well. A long time friend also died recently. The patient has been seeing his PCP, Dr. Linna Darner, who started him on Zoloft 25 mg and increased it to 50 mg 2 days ago. Patient is unsure if he has been increasing it to 50 mg. He denies any suicidal or homicidal thoughts. Patient does endorse that the Zoloft has been helping some of his depression symptoms. He primarily wants help to see a psychiatrist. He does not necessarily want to be admitted. He denies any other illnesses or acute change in his health.  Past Medical History  Diagnosis Date  . Loss of height   . Compression fracture   . Hypertension   . Hypercholesterolemia   . Rhinitis   . Dyspnea   . Weight loss   . CAD (coronary artery disease)   . Peptic ulcer disease   . Alcohol abuse   . AMI (acute mesenteric ischemia)   . Colon polyps   . Pleurisy with effusion   . Grief reaction   . Macular degeneration   . Leukocytosis, unspecified 05/07/2014  . Bilateral leg edema 05/07/2014  . Recurrent falls 05/07/2014   Past Surgical History  Procedure Laterality Date  . Exploratory laparotomy  1978    due to kidney problems  . Thoracotomy  1982    hemothorax  . Back surgery  2003    Cervical Spondylosis  . Spine surgery  2003    C-spine  . Cardiac catheterization  06/21/03  . Coronary stent placement  07/2003    x3  . Coronary angioplasty with stent placement  05/2004  . Colonoscopy w/ polypectomy  11/2006   Family History  Problem Relation Age of  Onset  . Diabetes Father   . Colon cancer Mother   . Colon cancer Son    History  Substance Use Topics  . Smoking status: Former Smoker    Quit date: 10/10/1946  . Smokeless tobacco: Never Used     Comment: cigars during college  . Alcohol Use: No     Comment: recovered ETOH abuser    Review of Systems  Constitutional: Positive for fatigue (chronic).  Respiratory: Negative for shortness of breath.   Cardiovascular: Negative for chest pain.  Psychiatric/Behavioral: Positive for dysphoric mood. Negative for suicidal ideas.  All other systems reviewed and are negative.     Allergies  Cefprozil and Simvastatin  Home Medications   Prior to Admission medications   Medication Sig Start Date End Date Taking? Authorizing Provider  aspirin 81 MG tablet Take 81 mg by mouth daily.      Historical Provider, MD  budesonide-formoterol (SYMBICORT) 160-4.5 MCG/ACT inhaler Inhale 2 puffs into the lungs 2 (two) times daily.    Historical Provider, MD  furosemide (LASIX) 40 MG tablet Take 1 tablet (40 mg total) by mouth daily. 04/30/14   Hendricks Limes, MD  hydroxyurea (HYDREA) 500 MG capsule Take 1 capsule (500 mg total) by mouth daily. May take with food to minimize GI side effects. 06/23/14  Heath Lark, MD  isosorbide mononitrate (IMDUR) 30 MG 24 hr tablet take 1 tablet by mouth once daily 06/04/14   Larey Dresser, MD  metoprolol succinate (TOPROL-XL) 50 MG 24 hr tablet take 1/4 tablet by mouth twice a day 11/01/13   Larey Dresser, MD  Multiple Minerals-Vitamins (CALCIUM & VIT D3 BONE HEALTH PO) Take by mouth daily.     Historical Provider, MD  Multiple Vitamins-Minerals (ANTIOXIDANT) TABS Take by mouth daily.      Historical Provider, MD  nitroGLYCERIN (NITROSTAT) 0.4 MG SL tablet Place 0.4 mg under the tongue every 5 (five) minutes as needed.      Historical Provider, MD  Omega-3 Fatty Acids (FISH OIL) 1000 MG CAPS Take by mouth daily.    Historical Provider, MD  sertraline (ZOLOFT) 50  MG tablet Take 1 tablet (50 mg total) by mouth daily. 07/29/14   Hendricks Limes, MD  traMADol (ULTRAM) 50 MG tablet 1 q 8 hrs prn 04/30/14   Hendricks Limes, MD   BP 122/64  Pulse 52  Temp(Src) 98.2 F (36.8 C) (Oral)  Resp 18  SpO2 98% Physical Exam  Nursing note and vitals reviewed. Constitutional: He is oriented to person, place, and time. He appears well-developed and well-nourished. No distress.  HENT:  Head: Normocephalic and atraumatic.  Right Ear: External ear normal.  Left Ear: External ear normal.  Nose: Nose normal.  Eyes: Right eye exhibits no discharge. Left eye exhibits no discharge.  Neck: Neck supple.  Cardiovascular: Normal rate, regular rhythm, normal heart sounds and intact distal pulses.   Pulmonary/Chest: Effort normal.  Abdominal: He exhibits no distension.  Neurological: He is alert and oriented to person, place, and time.  Skin: Skin is warm and dry. He is not diaphoretic.  Psychiatric: His speech is normal and behavior is normal. He expresses no homicidal and no suicidal ideation.    ED Course  Procedures (including critical care time) Labs Review Labs Reviewed - No data to display  Imaging Review No results found.   EKG Interpretation None      MDM   Final diagnoses:  Depression    Patient with depression without SI/HI. Appears to be somewhat improving on zoloft, and PCP increased 2 days ago. I reiterated that he was supposed to increase his dose, and discussed outpatient options and will give psych referrals. Patient amenable to this plan and will return or call 911 if symptoms worsen or he develops SI/HI.    Ephraim Hamburger, MD 08/01/14 [phone]

## 2014-08-01 NOTE — Discharge Instructions (Signed)
°Emergency Department Resource Guide °1) Find a Doctor and Pay Out of Pocket °Although you won't have to find out who is covered by your insurance plan, it is a good idea to ask around and get recommendations. You will then need to call the office and see if the doctor you have chosen will accept you as a new patient and what types of options they offer for patients who are self-pay. Some doctors offer discounts or will set up payment plans for their patients who do not have insurance, but you will need to ask so you aren't surprised when you get to your appointment. ° °2) Contact Your Local Health Department °Not all health departments have doctors that can see patients for sick visits, but many do, so it is worth a call to see if yours does. If you don't know where your local health department is, you can check in your phone book. The CDC also has a tool to help you locate your state's health department, and many state websites also have listings of all of their local health departments. ° °3) Find a Walk-in Clinic °If your illness is not likely to be very severe or complicated, you may want to try a walk in clinic. These are popping up all over the country in pharmacies, drugstores, and shopping centers. They're usually staffed by nurse practitioners or physician assistants that have been trained to treat common illnesses and complaints. They're usually fairly quick and inexpensive. However, if you have serious medical issues or chronic medical problems, these are probably not your best option. ° °No Primary Care Doctor: °- Call [facility] at  832-8000 - they can help you locate a primary care doctor that  accepts your insurance, provides certain services, etc. °- Physician Referral Service- 1-800-533-3463 ° °Chronic Pain Problems: °Organization         Address  Phone   Notes  °[facility]  (336) 297-2271 Patients need to be referred by their primary care doctor.  ° °Medication  Assistance: °Organization         Address  Phone   Notes  °[place] [facility] [address] °[place], [place] [address] (336) 641-8030 --Must be a resident of [place] °-- Must have NO insurance coverage whatsoever (no Medicaid/ Medicare, etc.) °-- The pt. MUST have a primary care doctor that directs their care regularly and follows them in the community °  °[facility]  (866) 331-1348   °[employer]  (888) 892-1162   ° °Agencies that provide inexpensive medical care: °Organization         Address  Phone   Notes  °[facility]  (336) 832-8035   °[facility]    (336) 832-7272   °[facility] [address] °[place], [place] [address] (336) 832-4777   °[facility] [address], °[place] (336) 271-4999   °[facility]    (336) 373-0678   °[facility]    (336) 272-1050   °[facility] ° [address], [place] Phone:  (336) 832-4444, Fax:  (336) 832-4440 Hours of Operation:  9 am - 6 pm, M-F.  Also accepts Medicaid/Medicare and self-pay.  °[facility] ° [address], [place] Phone: (336) [phone], Fax: (336) [phone]. Hours of Operation:  8:30 am - 5:30 pm, M-F.  Also accepts Medicaid and self-pay.  °[facility] [place] [address], [place] Phone: (336) 878-6027   °[facility] [address], [place], [place] (336)723-1848, Ext. 123 Mondays & Thursdays: 7-9 AM.  First 15 patients are seen on a first come, first serve basis. °  ° °Medicaid-accepting [place] Providers: ° °Organization         Address  Phone   Notes  °[facility] [address], [place] (336) 641-2100 Also accepts self-pay patients.  °[facility] [address], [place] ° (336) 856-9996   °[facility] [address], [place]  (336) 288-8857   °[facility] [address] [place] [address], [place] (336) 299-7000   °Veita Bland [address], [place]  ° (336) 373-1557 Only accepts [place] Access Medicaid patients after they have their name applied to their card.  ° °Self-Pay (no insurance) in [place]: ° °Organization         Address  Phone   Notes  °Sickle Cell Patients, [facility] [address], [place] (336) 832-1970   °[facility] [address], [place] (336) 832-4400   °[facility] ° [address], [place] (336) 992-4800   °[facility]/Dr. Osei-Bonsu ° [address] [place] [address], [place] or [address], [place] (336) [phone] Phone number for both [place] and [place] locations is the same.  °[facility] [address], [place] (336) 299-0000   °[facility] [address] [place] [address], [place] or [address] (336) 852-7530 °(336) 878-2260   °[facility] [address], [place] (336) 350-1642, phone; (336) 294-5005, fax Sees patients 1st and 3rd Saturday of every month.  Must not qualify for public or private insurance (i.e. Medicaid, Medicare, [place] Health Choice, Veterans' Benefits) • Household income should be no more than 200% of the poverty level •The clinic cannot treat you if you are pregnant or think you are pregnant • Sexually transmitted diseases are not treated at the clinic.  ° ° °Dental Care: °Organization         Address  Phone  Notes  °[place] [facility] [address], [place] (336) 641-6152 Accepts children up to age 21 who are enrolled in Medicaid or [place] Health Choice; pregnant women with a Medicaid card; and children who have applied for Medicaid or [place] Health Choice, but were declined, whose parents can pay a reduced fee at time of service.  °[place]  [facility] [place]  [address], [place] (336) 641-7733 Accepts children up to age 21 who are enrolled in Medicaid or [place] Health Choice; pregnant women with a Medicaid card; and children who have applied for Medicaid or  Health Choice, but were declined, whose parents can pay a reduced fee at time of service.  °[facility] ° [address],  (336) 641-4533 Patients are seen by appointment only. Walk-ins are not accepted. [facility] will see patients 18 years of age and older. °Monday - Tuesday (8am-5pm) °Most Wednesdays (8:30-5pm) °$30 per visit, cash only  °[facility] ° [address], [place] (336) 641-4533 Patients are seen by appointment only. Walk-ins are not accepted. [facility] will see patients 18 years of age and older. °One   Wednesday Evening (Monthly: Volunteer Based).  $30 per visit, cash only  °[facility]  (919) [phone] for adults; Children under age 4, call Graduate Pediatric Dentistry at ([phone]) [phone]. Children aged 4-14, please call ([phone]) [phone] to request a pediatric application. ° Dental services are provided in all areas of dental care including fillings, crowns and bridges, complete and partial dentures, implants, gum treatment, root canals, and extractions. Preventive care is also provided. Treatment is provided to both adults and children. °Patients are selected via a lottery and there is often a waiting list. °  °[facility] [address], °[place] ° (336) 763-8833 www.drcivils.[email or web address] °  °[facility] [address], [place], [place] (336)723-1848, Ext. 123 Second and Fourth Thursday of each month, opens at 6:30 AM; Clinic ends at 9 AM.  Patients are seen on a first-come first-served basis, and a limited number are seen during each clinic.  ° °[facility] ° [address], [place], [place] (336) [phone]    Eligibility Requirements °You must have lived in [place], [place], or [place] counties for at least the last three months. °  You cannot be eligible for state or federal sponsored healthcare insurance, including Veterans Administration, Medicaid, or Medicare. °  You generally cannot be eligible for healthcare insurance through your employer.  °  How to apply: °Eligibility screenings are held every Tuesday and Wednesday afternoon from 1:00 pm until 4:00 pm. You do not need an appointment for the interview!  °[facility] [address], [place], [place] 336-631-2330   °[facility]  336-342-8273   °[facility]  336-703-3100   °[facility]  336-570-6415   ° °Behavioral Health Resources in the Community: °Intensive Outpatient Programs °Organization         Address  Phone  Notes  °[facility] [address], [place], [place] 336-878-6098   °[facility] [address], [place], [place] 336-832-9800   °[facility] [address], [place], [place] ° 336-882-2125   °[facility] [address],  °[place], [place] 1-800-853-5163 or 336-641-4981   °Substance Abuse Resources °Organization         Address  Phone  Notes  °[facility]  336-882-2125   °[facility]  336-784-9470   °[facility]  336-285-9073   °[facility]  336-845-3988   °Residential & Outpatient Substance Abuse Program  1-800-659-3381   °Psychological Services °Organization         Address  Phone  Notes  °[facility]  336- 832-9600   °[facility]  336- 378-7881   °[facility] [address], [place] 1-800-853-5163 or 336-641-4981   ° °Mobile Crisis Teams °Organization         Address  Phone  Notes  °[facility], Mobile Crisis Care Unit  1-877-626-1772   °Assertive °[facility] ° [address]  [place], [place] 336-834-9664   °Sharon DeEsch [address] °[place] 336-554-5454   ° °Self-Help/Support Groups °Organization         Address  Phone             Notes  °[facility] - variety of support groups  336- 373-1402 Call for more information  °Narcotics Anonymous (NA), [facility] [address], °[place]  2 meetings at this location  ° °  Residential Treatment Programs Organization         Address  Phone  Notes  [facility] [address]    [place]  1-984 660 2134   [facility]  [address] [place] [address], [place], [place]   [place], [place] [phone] Admissions: 8am-3pm M-F  [facility] [place] [address]    [place], [place] 416-606-3016   [facility] [address] [place], [place], [place]   [facility] [address]  [place], [place]   [facility] - Intensive Outpatient [address], [place], [place]   [facility] ([place]  [place], [place] 1-606 788 2139 or [phone]   [facility] [address] [place], [place] Accepts Medicaid  [facility] [address],  [place] 1-954-853-8018 Substance Abuse/Addiction Treatment   [facility]         Address  Phone  Notes  [facility]  9082203923   Domenic Schwab, PhD [address] [place], [place]   (865) 443-7793 or 775-420-0623   [facility] [place], [place] (262) 110-7149   [facility] [address] [place], [place] (860) [phone] Insurance/Medicaid/sponsorship through [facility] [address]                                    [place], [place] [phone] [place] [address] [place], [place] [phone]    Dr. Adele Schilder  [phone]   [facility] Dept. 1) [address] [place] 2) [place] 3)  [address], [place] 336-123-0217 [phone]  ([phone]   [place] 570-804-4694 or ([phone]) [phone] (After Hours)          Depression Depression refers to feeling sad, low, down in the dumps, blue, gloomy, or empty. In general, there are two kinds of depression: 1. Normal sadness or normal grief. This kind of depression is one that we all feel from time to time after upsetting life experiences, such as the loss of a job or the ending of a relationship. This kind of depression is considered normal, is short lived, and resolves within a few days to 2 weeks. Depression experienced after the loss of a loved one (bereavement) often lasts longer than 2 weeks but normally gets better with time. 2. Clinical depression. This kind of depression lasts longer than normal sadness or normal grief or interferes with your ability to function at home, at work, and in school. It also interferes with your personal relationships. It affects almost every aspect of your life. Clinical depression is an illness. Symptoms of depression can also be caused by conditions other than those mentioned above, such as:  Physical illness. Some physical illnesses, including underactive thyroid gland (hypothyroidism), severe anemia, specific types of cancer, diabetes, uncontrolled seizures, heart and lung problems, strokes, and chronic pain are commonly associated with symptoms of depression.  Side effects of some prescription medicine. In some people, certain types of medicine can cause symptoms of depression.  Substance abuse. Abuse of alcohol and illicit  drugs can cause symptoms of depression. SYMPTOMS Symptoms of normal sadness and normal grief  include the following:  Feeling sad or crying for short periods of time.  Not caring about anything (apathy).  Difficulty sleeping or sleeping too much.  No longer able to enjoy the things you used to enjoy.  Desire to be by oneself all the time (social isolation).  Lack of energy or motivation.  Difficulty concentrating or remembering.  Change in appetite or weight.  Restlessness or agitation. Symptoms of clinical depression include the same symptoms of normal sadness or normal grief and also the following symptoms:  Feeling sad or crying all the time.  Feelings of guilt or worthlessness.  Feelings of hopelessness or helplessness.  Thoughts of suicide or the desire to harm yourself (suicidal ideation).  Loss of touch with reality (psychotic symptoms). Seeing or hearing things that are not real (hallucinations) or having false beliefs about your life or the people around you (delusions and paranoia). DIAGNOSIS  The diagnosis of clinical depression is usually based on how bad the symptoms are and how long they have lasted. Your health care provider will also ask you questions about your medical history and substance use to find out if physical illness, use of prescription medicine, or substance abuse is causing your depression. Your health care provider may also order blood tests. TREATMENT  Often, normal sadness and normal grief do not require treatment. However, sometimes antidepressant medicine is given for bereavement to ease the depressive symptoms until they resolve. The treatment for clinical depression depends on how bad the symptoms are but often includes antidepressant medicine, counseling with a mental health professional, or both. Your health care provider will help to determine what treatment is best for you. Depression caused by physical illness usually goes away with appropriate medical treatment of the illness. If prescription medicine is causing depression, talk  with your health care provider about stopping the medicine, decreasing the dose, or changing to another medicine. Depression caused by the abuse of alcohol or illicit drugs goes away when you stop using these substances. Some adults need professional help in order to stop drinking or using drugs. SEEK IMMEDIATE MEDICAL CARE IF:  You have thoughts about hurting yourself or others.  You lose touch with reality (have psychotic symptoms).  You are taking medicine for depression and have a serious side effect. FOR MORE INFORMATION  National Alliance on Mental Illness: www.nami.[employer] of Mental Health: https://carter.com/ Document Released: 09/23/2000 Document Revised: 02/10/2014 Document Reviewed: 12/26/2011 [facility] Patient Information 2015 [place], [place]. This information is not intended to replace advice given to you by your health care provider. Make sure you discuss any questions you have with your health care provider.

## 2014-08-01 NOTE — ED Notes (Signed)
Per pt, states he has been deperessed on and off for about 6 weeks-states he recently lost a son and "Threasa Beards friend" to cancer-states one of his neighbors just passed and he is sad

## 2014-08-06 ENCOUNTER — Ambulatory Visit (INDEPENDENT_AMBULATORY_CARE_PROVIDER_SITE_OTHER): Payer: Medicare Other | Admitting: Family Medicine

## 2014-08-06 VITALS — BP 110/50 | HR 68 | Temp 98.6°F | Resp 18 | Ht 67.0 in | Wt 167.8 lb

## 2014-08-06 DIAGNOSIS — F32A Depression, unspecified: Secondary | ICD-10-CM

## 2014-08-06 DIAGNOSIS — D649 Anemia, unspecified: Secondary | ICD-10-CM

## 2014-08-06 DIAGNOSIS — F329 Major depressive disorder, single episode, unspecified: Secondary | ICD-10-CM

## 2014-08-06 DIAGNOSIS — K5900 Constipation, unspecified: Secondary | ICD-10-CM

## 2014-08-06 DIAGNOSIS — R627 Adult failure to thrive: Secondary | ICD-10-CM

## 2014-08-06 NOTE — Progress Notes (Addendum)
Subjective:    Patient ID: Daniel Ali, male    DOB: 05-20-1925, 80 y.o.   MRN: [id number] This chart was scribed for Daniel Ali by Daniel Ali. The patient was seen in Room 11. The patient's care was started at 4:29 PM.   08/06/2014  Chief Complaint  Patient presents with  . medication issues    pt wants to discuss the concern of taking lots of meds and his diet; pt friend states he is not eating good   HPI HPI Comments: Pt is new to me. This pt is followed by Dr. Unice [facility] at [facility], most recently seen October 20 for depression. He had been seen prior to that on the 8th with the initial diagnosis of depression. MMSE 27/30. At that time endorsed poor appetite, trouble staying asleep, constipation, abdominal cramping. Appears to have concern with IDL's at that visit but able to perform ADL's without difficulty. He was started on low dose sertraline at 25 mg q.d for depression. Possible social worker to assess home situation. Also, noted recommendation for the three adult children to meet with him and any case worker if any evaluations completed. On follow-up on October 20, reported 15% on Zoloft 125 per day. Diagnosed with failure to thrive and depression at that time, planned for home health nurse assessment, increased Zoloft to 50 mg q.d and for all 3 adult children to meet with him after home health report. He was seen 5 days in ED for depression, wanting to see a psychiatrist at that time. Pt was not suicidial or homicidal and was denied admission at that time. Advised to increase dose of Zoloft to 50 mg.  Falling Risk Daniel Ali is a 80 y.o. male who presents to the [facility] complaining of medication issues. Pt is here with his friend today. Pt's friend is concerned with falling risk. Pt is complaining of increasing light-headedness and dizziness. Pt denies any recent falls but is concerned that the pt is walking  with an improper cane and the recent rainy weather while coming down a steep ramp from his home. Pt is also complaining of failure to thrive. Pt notes he lives alone and his children live in different states. He states the home health nurse, Daniel Ali from [place], visited his home but is unable to provide home health and safety evaluation paperwork.  Bowel Movements Pt's friend is also concerned with the pt's current medications and their side effects. Pt is complaining of new, gradually worsening, moderate constipation. Pt denies having a BM daily. He has BM 2-3 times per week. He has been prescribed narcotic medications but denies taking oxycodone and tramadol. Pt denies taking any stool softeners to relieve his symptoms.   Malnutrition Pt believes he is malnutritioned and notes he currently has a poor diet. Pt denies having an appetite throughout the day. He also notes he doesn't drink very water.   Depression Pt notes he is depressed pretty badly. He states he went to ED because he felt more depressed. He states he is feeling more depressed compared to his last visit with Daniel Ali. Pt does have a firearm at home. He currently lives by himself. Pt denies SI or self-harm.    Patient Active Problem List   Diagnosis Date Noted  . Depression 07/29/2014  . Adult failure to thrive 07/29/2014  . Non-compliant behavior 07/17/2014  . Leukopenia due to antineoplastic chemotherapy 06/09/2014  . Hypotension 06/09/2014  .  Myeloproliferative disease 05/16/2014  . Anemia in neoplastic disease 05/16/2014  . Leukocytosis, unspecified 05/07/2014  . Bilateral leg edema 05/07/2014  . Recurrent falls 05/07/2014  . Elevated platelet count 05/01/2014  . TIA (transient ischemic attack) 03/19/2014  . Fall against object 02/15/2014  . Left shoulder pain 02/15/2014  . Rotator cuff injury 02/13/2014  . Hyperlipidemia 12/09/2013  . COPD (chronic obstructive pulmonary disease) 08/24/2012  . CANDIDIASIS 11/12/2010   . CONSTIPATION 11/12/2010  . RASH-NONVESICULAR 11/12/2010  . MELENA, HX OF 11/12/2010  . ANEMIA, MILD 09/13/2010  . Nonspecific abnormal finding in stool contents 08/30/2010  . Personal history of DVT (deep vein thrombosis) 05/12/2010  . RENAL INSUFFICIENCY 05/04/2010  . FOOT PAIN, LEFT 05/04/2010  . EDEMA- LOCALIZED 05/04/2010  . HYPERTENSION, UNSPECIFIED 01/22/2009  . RHINITIS 11/27/2008  . LOW HDL 11/29/2007  . C A D 11/29/2007  . COLONIC POLYPS 09/12/2007  . ALCOHOL ABUSE 09/12/2007  . AMI 09/12/2007  . PEPTIC ULCER DISEASE 09/12/2007  . COMPRESSION FRACTURE 09/12/2007  . PLEURISY WITHOUT MENTION EFFUS/CURRENT TB 09/10/2007   Past Medical History  Diagnosis Date  . Loss of height   . Compression fracture   . Hypertension   . Hypercholesterolemia   . Rhinitis   . Dyspnea   . Weight loss   . CAD (coronary artery disease)   . Peptic ulcer disease   . Alcohol abuse   . AMI (acute mesenteric ischemia)   . Colon polyps   . Pleurisy with effusion   . Grief reaction   . Macular degeneration   . Leukocytosis, unspecified 05/07/2014  . Bilateral leg edema 05/07/2014  . Recurrent falls 05/07/2014   Past Surgical History  Procedure Laterality Date  . Exploratory laparotomy  1978    due to kidney problems  . Thoracotomy  1982    hemothorax  . Back surgery  2003    Cervical Spondylosis  . Spine surgery  2003    C-spine  . Cardiac catheterization  06/21/03  . Coronary stent placement  07/2003    x3  . Coronary angioplasty with stent placement  05/2004  . Colonoscopy w/ polypectomy  11/2006   Allergies  Allergen Reactions  . Cefprozil     REACTION: insomnia  . Simvastatin     REACTION: MUSCLE ACHES   Prior to Admission medications   Medication Sig Start Date End Date Taking? Authorizing Provider  aspirin 81 MG tablet Take 81 mg by mouth daily.     Yes Historical Provider, Ali  budesonide-formoterol (SYMBICORT) 160-4.5 MCG/ACT inhaler Inhale 2 puffs into the lungs 2  (two) times daily.   Yes Historical Provider, Ali  furosemide (LASIX) 40 MG tablet Take 1 tablet (40 mg total) by mouth daily. 04/30/14  Yes Hendricks Limes, Ali  hydroxyurea (HYDREA) 500 MG capsule Take 1 capsule (500 mg total) by mouth daily. May take with food to minimize GI side effects. 06/23/14  Yes Heath Lark, Ali  isosorbide mononitrate (IMDUR) 30 MG 24 hr tablet take 1 tablet by mouth once daily 06/04/14  Yes Larey Dresser, Ali  metoprolol succinate (TOPROL-XL) 50 MG 24 hr tablet take 1/4 tablet by mouth twice a day 11/01/13  Yes Larey Dresser, Ali  Multiple Minerals-Vitamins (CALCIUM & VIT D3 BONE HEALTH PO) Take by mouth daily.    Yes Historical Provider, Ali  Multiple Vitamins-Minerals (ANTIOXIDANT) TABS Take by mouth daily.     Yes Historical Provider, Ali  nitroGLYCERIN (NITROSTAT) 0.4 MG SL tablet Place 0.4 mg under  the tongue every 5 (five) minutes as needed.     Yes Historical Provider, Ali  Omega-3 Fatty Acids (FISH OIL) 1000 MG CAPS Take by mouth daily.   Yes Historical Provider, Ali  sertraline (ZOLOFT) 50 MG tablet Take 1 tablet (50 mg total) by mouth daily. 07/29/14  Yes Hendricks Limes, Ali  traMADol Veatrice Bourbon) 50 MG tablet 1 q 8 hrs prn 04/30/14  Yes Hendricks Limes, Ali   History   Social History  . Marital Status: Divorced    Spouse Name: N/A    Number of Children: 60  . Years of Education: N/A   Occupational History  . Retired    Social History Main Topics  . Smoking status: Former Smoker    Quit date: 10/10/1946  . Smokeless tobacco: Never Used     Comment: cigars during college  . Alcohol Use: No     Comment: recovered ETOH abuser  . Drug Use: No  . Sexual Activity: Not Currently   Other Topics Concern  . Not on file   Social History Narrative   Lives by him self , able to do all ADL   Review of Systems  Constitutional: Negative for fever and chills.  Gastrointestinal: Positive for constipation. Negative for nausea and vomiting.  Neurological: Positive for  dizziness and light-headedness.  Psychiatric/Behavioral: Positive for dysphoric mood. Negative for self-injury.   Objective:  Physical Exam  Vitals reviewed. Constitutional: He appears well-developed and well-nourished.  HENT:  Head: Normocephalic and atraumatic.  Eyes: EOM are normal. Pupils are equal, round, and reactive to light.  Neck: No JVD present. Carotid bruit is not present.  Cardiovascular: Normal rate, regular rhythm and normal heart sounds.   No murmur heard. Pulmonary/Chest: Effort normal and breath sounds normal. He has no rales.  Musculoskeletal: He exhibits no edema.  Neurological: He is alert. GCS eye subscore is 4. GCS verbal subscore is 5. GCS motor subscore is 6.  Walks with cane.  Skin: Skin is warm and dry.  Psychiatric: His speech is normal. His affect is blunt. He is slowed. He expresses no suicidal ideation. He expresses no suicidal plans.     Filed Vitals:   08/06/14 1537  BP: 110/50  Pulse: 68  Temp: 98.6 F (37 C)  TempSrc: Oral  Resp: 18  Height: 5\' 7"  (1.702 m)  Weight: 167 lb 12.8 oz (76.114 kg)  SpO2: 94%    Assessment & Plan:  4:59 PM- Patient informed of current plan for treatment and evaluation and agrees with plan at this time.  Phone call to PCP Daniel Ali discussed current symptoms and concern also reiterated his underlying depression being a contributor to his current symptoms. Discussed behavioral Health or ER eval if symptoms progressed. On further discussion with pt, his depression has worsened even from when he went to the ED. He still denies SI and HI, but would be admittable to hospitalization.   KELLIE CHISOLM is a 80 y.o. male Depression  Adult failure to thrive  Anemia, unspecified anemia type  Constipation, unspecified constipation type  Prior notes reviewed and discussed with PCP.  It appears his depression is primary cause of current failure to thrive, not eating, anhedonia, and by his hx today - worsening since  ER evaluation.  Will have him evaluated at [facility] for FTT and underlying depression, and can be determined if inpatient care needed. HH safety eval in process - call placed to nurse on [place].  Advised to bring meds to  hospital to look into medication and adherence. Friend present with him today plans on accompanying him to home then ER to help with this. If needed - repeat bloodwork for underlying anemia can be checked inpatient or in follow up with PCP.   Discussed common causes of constipation. Treatments, but to look at med list as narcotic effect may be contributory.   Understanding of plan expressed and ER charge nurse at [facility] advised.   No orders of the defined types were placed in this encounter.   Patient Instructions  I am concerned about your depression worsening, including since last visit.  After picking up your medication from home - go to [facility] emergency room for evaluation of you depression and other difficulties with fatigue, decreased appetite and intake.  We did call [place] home health and left a message with Daniel Ali to determine when the home safety eval would occur.  This can also be discussed with the providers in the hospital.   Metamucil or other fiber supplement ok, or Colace to help with depression. Increase your water intake as this can also cause constipation.   Can recehck blood count in hospital, or with your primary provider for your anemia.   Follow up with primary provider after evaluation and treatment as recommended in hospital.   Constipation Constipation is when a person has fewer than three bowel movements a week, has difficulty having a bowel movement, or has stools that are dry, hard, or larger than normal. As people grow older, constipation is more common. If you try to fix constipation with medicines that make you have a bowel movement (laxatives), the problem may get worse. Long-term laxative use may cause the muscles of the colon to become  weak. A low-fiber diet, not taking in enough fluids, and taking certain medicines may make constipation worse.  CAUSES   Certain medicines, such as antidepressants, pain medicine, iron supplements, antacids, and water pills.   Certain diseases, such as diabetes, irritable bowel syndrome (IBS), thyroid disease, or depression.   Not drinking enough water.   Not eating enough fiber-rich foods.   Stress or travel.   Lack of physical activity or exercise.   Ignoring the urge to have a bowel movement.   Using laxatives too much.  SIGNS AND SYMPTOMS   Having fewer than three bowel movements a week.   Straining to have a bowel movement.   Having stools that are hard, dry, or larger than normal.   Feeling full or bloated.   Pain in the lower abdomen.   Not feeling relief after having a bowel movement.  DIAGNOSIS  Your health care provider will take a medical history and perform a physical exam. Further testing may be done for severe constipation. Some tests may include:  A barium enema X-[facility] to examine your rectum, colon, and, sometimes, your small intestine.   A sigmoidoscopy to examine your lower colon.   A colonoscopy to examine your entire colon. TREATMENT  Treatment will depend on the severity of your constipation and what is causing it. Some dietary treatments include drinking more fluids and eating more fiber-rich foods. Lifestyle treatments may include regular exercise. If these diet and lifestyle recommendations do not help, your health care provider may recommend taking over-the-counter laxative medicines to help you have bowel movements. Prescription medicines may be prescribed if over-the-counter medicines do not work.  HOME CARE INSTRUCTIONS   Eat foods that have a lot of fiber, such as fruits, vegetables, whole grains, and beans.  Limit foods high in fat and processed sugars, such as french fries, hamburgers, cookies, candies, and soda.   A fiber  supplement may be added to your diet if you cannot get enough fiber from foods.   Drink enough fluids to keep your urine clear or pale yellow.   Exercise regularly or as directed by your health care provider.   Go to the restroom when you have the urge to go. Do not hold it.   Only take over-the-counter or prescription medicines as directed by your health care provider. Do not take other medicines for constipation without talking to your health care provider first.  [place] IF:   You have bright red blood in your stool.   Your constipation lasts for more than 4 days or gets worse.   You have abdominal or rectal pain.   You have thin, pencil-like stools.   You have unexplained weight loss. MAKE SURE YOU:   Understand these instructions.  Will watch your condition.  Will get help right away if you are not doing well or get worse. Document Released: 06/24/2004 Document Revised: 10/01/2013 Document Reviewed: 07/08/2013 [facility] Patient Information 2015 [place], [place]. This information is not intended to replace advice given to you by your health care provider. Make sure you discuss any questions you have with your health care provider.   Depression Depression refers to feeling sad, low, down in the dumps, blue, gloomy, or empty. In general, there are two kinds of depression: 1. Normal sadness or normal grief. This kind of depression is one that we all feel from time to time after upsetting life experiences, such as the loss of a job or the ending of a relationship. This kind of depression is considered normal, is short lived, and resolves within a few days to 2 weeks. Depression experienced after the loss of a loved one (bereavement) often lasts longer than 2 weeks but normally gets better with time. 2. Clinical depression. This kind of depression lasts longer than normal sadness or normal grief or interferes with your ability to function at home, at work,  and in school. It also interferes with your personal relationships. It affects almost every aspect of your life. Clinical depression is an illness. Symptoms of depression can also be caused by conditions other than those mentioned above, such as:  Physical illness. Some physical illnesses, including underactive thyroid gland (hypothyroidism), severe anemia, specific types of cancer, diabetes, uncontrolled seizures, heart and lung problems, strokes, and chronic pain are commonly associated with symptoms of depression.  Side effects of some prescription medicine. In some people, certain types of medicine can cause symptoms of depression.  Substance abuse. Abuse of alcohol and illicit drugs can cause symptoms of depression. SYMPTOMS Symptoms of normal sadness and normal grief include the following:  Feeling sad or crying for short periods of time.  Not caring about anything (apathy).  Difficulty sleeping or sleeping too much.  No longer able to enjoy the things you used to enjoy.  Desire to be by oneself all the time (social isolation).  Lack of energy or motivation.  Difficulty concentrating or remembering.  Change in appetite or weight.  Restlessness or agitation. Symptoms of clinical depression include the same symptoms of normal sadness or normal grief and also the following symptoms:  Feeling sad or crying all the time.  Feelings of guilt or worthlessness.  Feelings of hopelessness or helplessness.  Thoughts of suicide or the desire to harm yourself (suicidal ideation).  Loss  of touch with reality (psychotic symptoms). Seeing or hearing things that are not real (hallucinations) or having false beliefs about your life or the people around you (delusions and paranoia). DIAGNOSIS  The diagnosis of clinical depression is usually based on how bad the symptoms are and how long they have lasted. Your health care provider will also ask you questions about your medical history and  substance use to find out if physical illness, use of prescription medicine, or substance abuse is causing your depression. Your health care provider may also order blood tests. TREATMENT  Often, normal sadness and normal grief do not require treatment. However, sometimes antidepressant medicine is given for bereavement to ease the depressive symptoms until they resolve. The treatment for clinical depression depends on how bad the symptoms are but often includes antidepressant medicine, counseling with a mental health professional, or both. Your health care provider will help to determine what treatment is best for you. Depression caused by physical illness usually goes away with appropriate medical treatment of the illness. If prescription medicine is causing depression, talk with your health care provider about stopping the medicine, decreasing the dose, or changing to another medicine. Depression caused by the abuse of alcohol or illicit drugs goes away when you stop using these substances. Some adults need professional help in order to stop drinking or using drugs. SEEK IMMEDIATE MEDICAL CARE IF:  You have thoughts about hurting yourself or others.  You lose touch with reality (have psychotic symptoms).  You are taking medicine for depression and have a serious side effect. FOR MORE INFORMATION  National [employer] on Mental Illness: www.nami.[employer]: https://carter.com/ Document Released: 09/23/2000 Document Revised: 02/10/2014 Document Reviewed: 12/26/2011 [facility] Patient Information 2015 [place], [place]. This information is not intended to replace advice given to you by your health care provider. Make sure you discuss any questions you have with your health care provider.     I personally performed the services described in this documentation, which was scribed in my presence. The recorded information has been reviewed and considered, and addended by me  as needed.

## 2014-08-06 NOTE — Patient Instructions (Addendum)
I am concerned about your depression worsening, including since last visit.  After picking up your medication from home - go to [facility] emergency room for evaluation of you depression and other difficulties with fatigue, decreased appetite and intake.  We did call [facility] home [facility] and left a message with [name] to determine when the home safety eval would occur.  This can also be discussed with the providers in the hospital.   Metamucil or other fiber supplement ok, or Colace to help with depression. Increase your water intake as this can also cause constipation.   Can recehck blood count in hospital, or with your primary provider for your anemia.   Follow up with primary provider after evaluation and treatment as recommended in hospital.   Constipation Constipation is when a person has fewer than three bowel movements a week, has difficulty having a bowel movement, or has stools that are dry, hard, or larger than normal. As people grow older, constipation is more common. If you try to fix constipation with medicines that make you have a bowel movement (laxatives), the problem may get worse. Long-term laxative use may cause the muscles of the colon to become weak. A low-fiber diet, not taking in enough fluids, and taking certain medicines may make constipation worse.  CAUSES   Certain medicines, such as antidepressants, pain medicine, iron supplements, antacids, and water pills.   Certain diseases, such as diabetes, irritable bowel syndrome (IBS), thyroid disease, or depression.   Not drinking enough water.   Not eating enough fiber-rich foods.   Stress or travel.   Lack of physical activity or exercise.   Ignoring the urge to have a bowel movement.   Using laxatives too much.  SIGNS AND SYMPTOMS   Having fewer than three bowel movements a week.   Straining to have a bowel movement.   Having stools that are hard, dry, or larger than normal.   Feeling full or  bloated.   Pain in the lower abdomen.   Not feeling relief after having a bowel movement.  DIAGNOSIS  Your health care provider will take a medical history and perform a physical exam. Further testing may be done for severe constipation. Some tests may include:  A barium enema X-ray to examine your rectum, colon, and, sometimes, your small intestine.   A sigmoidoscopy to examine your lower colon.   A colonoscopy to examine your entire colon. TREATMENT  Treatment will depend on the severity of your constipation and what is causing it. Some dietary treatments include drinking more fluids and eating more fiber-rich foods. Lifestyle treatments may include regular exercise. If these diet and lifestyle recommendations do not help, your health care provider may recommend taking over-the-counter laxative medicines to help you have bowel movements. Prescription medicines may be prescribed if over-the-counter medicines do not work.  HOME CARE INSTRUCTIONS   Eat foods that have a lot of fiber, such as fruits, vegetables, whole grains, and beans.  Limit foods high in fat and processed sugars, such as french fries, hamburgers, cookies, candies, and soda.   A fiber supplement may be added to your diet if you cannot get enough fiber from foods.   Drink enough fluids to keep your urine clear or pale yellow.   Exercise regularly or as directed by your health care provider.   Go to the restroom when you have the urge to go. Do not hold it.   Only take over-the-counter or prescription medicines as directed by your health care provider.  Do not take other medicines for constipation without talking to your health care provider first.  [place] IF:   You have bright red blood in your stool.   Your constipation lasts for more than 4 days or gets worse.   You have abdominal or rectal pain.   You have thin, pencil-like stools.   You have unexplained weight loss. MAKE  SURE YOU:   Understand these instructions.  Will watch your condition.  Will get help right away if you are not doing well or get worse. Document Released: 06/24/2004 Document Revised: 10/01/2013 Document Reviewed: 07/08/2013 [facility] Patient Information 2015 [place], [place]. This information is not intended to replace advice given to you by your health care provider. Make sure you discuss any questions you have with your health care provider.   Depression Depression refers to feeling sad, low, down in the dumps, blue, gloomy, or empty. In general, there are two kinds of depression: 1. Normal sadness or normal grief. This kind of depression is one that we all feel from time to time after upsetting life experiences, such as the loss of a job or the ending of a relationship. This kind of depression is considered normal, is short lived, and resolves within a few days to 2 weeks. Depression experienced after the loss of a loved one (bereavement) often lasts longer than 2 weeks but normally gets better with time. 2. Clinical depression. This kind of depression lasts longer than normal sadness or normal grief or interferes with your ability to function at home, at work, and in school. It also interferes with your personal relationships. It affects almost every aspect of your life. Clinical depression is an illness. Symptoms of depression can also be caused by conditions other than those mentioned above, such as:  Physical illness. Some physical illnesses, including underactive thyroid gland (hypothyroidism), severe anemia, specific types of cancer, diabetes, uncontrolled seizures, heart and lung problems, strokes, and chronic pain are commonly associated with symptoms of depression.  Side effects of some prescription medicine. In some people, certain types of medicine can cause symptoms of depression.  Substance abuse. Abuse of alcohol and illicit drugs can cause symptoms of  depression. SYMPTOMS Symptoms of normal sadness and normal grief include the following:  Feeling sad or crying for short periods of time.  Not caring about anything (apathy).  Difficulty sleeping or sleeping too much.  No longer able to enjoy the things you used to enjoy.  Desire to be by oneself all the time (social isolation).  Lack of energy or motivation.  Difficulty concentrating or remembering.  Change in appetite or weight.  Restlessness or agitation. Symptoms of clinical depression include the same symptoms of normal sadness or normal grief and also the following symptoms:  Feeling sad or crying all the time.  Feelings of guilt or worthlessness.  Feelings of hopelessness or helplessness.  Thoughts of suicide or the desire to harm yourself (suicidal ideation).  Loss of touch with reality (psychotic symptoms). Seeing or hearing things that are not real (hallucinations) or having false beliefs about your life or the people around you (delusions and paranoia). DIAGNOSIS  The diagnosis of clinical depression is usually based on how bad the symptoms are and how long they have lasted. Your health care provider will also ask you questions about your medical history and substance use to find out if physical illness, use of prescription medicine, or substance abuse is causing your depression. Your health care provider may also order blood  tests. TREATMENT  Often, normal sadness and normal grief do not require treatment. However, sometimes antidepressant medicine is given for bereavement to ease the depressive symptoms until they resolve. The treatment for clinical depression depends on how bad the symptoms are but often includes antidepressant medicine, counseling with a mental health professional, or both. Your health care provider will help to determine what treatment is best for you. Depression caused by physical illness usually goes away with appropriate medical treatment of  the illness. If prescription medicine is causing depression, talk with your health care provider about stopping the medicine, decreasing the dose, or changing to another medicine. Depression caused by the abuse of alcohol or illicit drugs goes away when you stop using these substances. Some adults need professional help in order to stop drinking or using drugs. SEEK IMMEDIATE MEDICAL CARE IF:  You have thoughts about hurting yourself or others.  You lose touch with reality (have psychotic symptoms).  You are taking medicine for depression and have a serious side effect. FOR MORE INFORMATION  National [employer] on Mental Illness: www.nami.[employer]: https://carter.com/ Document Released: 09/23/2000 Document Revised: 02/10/2014 Document Reviewed: 12/26/2011 [facility] Patient Information 2015 [place], [place]. This information is not intended to replace advice given to you by your health care provider. Make sure you discuss any questions you have with your health care provider.

## 2014-08-07 ENCOUNTER — Encounter (HOSPITAL_COMMUNITY): Payer: Self-pay | Admitting: Emergency Medicine

## 2014-08-07 ENCOUNTER — Emergency Department (HOSPITAL_COMMUNITY): Payer: Medicare Other

## 2014-08-07 ENCOUNTER — Observation Stay (HOSPITAL_COMMUNITY)
Admission: EM | Admit: 2014-08-07 | Discharge: 2014-08-11 | Disposition: A | Discharge status: Skilled Nursing Facility | Payer: Medicare Other | Attending: Internal Medicine | Admitting: Internal Medicine

## 2014-08-07 ENCOUNTER — Telehealth: Payer: Self-pay

## 2014-08-07 DIAGNOSIS — I2510 Atherosclerotic heart disease of native coronary artery without angina pectoris: Secondary | ICD-10-CM | POA: Insufficient documentation

## 2014-08-07 DIAGNOSIS — F339 Major depressive disorder, recurrent, unspecified: Secondary | ICD-10-CM | POA: Diagnosis not present

## 2014-08-07 DIAGNOSIS — Z9181 History of falling: Secondary | ICD-10-CM | POA: Insufficient documentation

## 2014-08-07 DIAGNOSIS — F4321 Adjustment disorder with depressed mood: Secondary | ICD-10-CM

## 2014-08-07 DIAGNOSIS — D471 Chronic myeloproliferative disease: Secondary | ICD-10-CM

## 2014-08-07 DIAGNOSIS — R627 Adult failure to thrive: Secondary | ICD-10-CM

## 2014-08-07 DIAGNOSIS — J449 Chronic obstructive pulmonary disease, unspecified: Secondary | ICD-10-CM | POA: Insufficient documentation

## 2014-08-07 DIAGNOSIS — R059 Cough, unspecified: Secondary | ICD-10-CM

## 2014-08-07 DIAGNOSIS — Z7982 Long term (current) use of aspirin: Secondary | ICD-10-CM | POA: Insufficient documentation

## 2014-08-07 DIAGNOSIS — I10 Essential (primary) hypertension: Secondary | ICD-10-CM | POA: Diagnosis not present

## 2014-08-07 DIAGNOSIS — D649 Anemia, unspecified: Secondary | ICD-10-CM | POA: Insufficient documentation

## 2014-08-07 DIAGNOSIS — I951 Orthostatic hypotension: Secondary | ICD-10-CM | POA: Diagnosis present

## 2014-08-07 DIAGNOSIS — D630 Anemia in neoplastic disease: Secondary | ICD-10-CM

## 2014-08-07 DIAGNOSIS — R296 Repeated falls: Secondary | ICD-10-CM

## 2014-08-07 DIAGNOSIS — F32A Depression, unspecified: Secondary | ICD-10-CM

## 2014-08-07 DIAGNOSIS — Z87891 Personal history of nicotine dependence: Secondary | ICD-10-CM | POA: Diagnosis not present

## 2014-08-07 DIAGNOSIS — C946 Myelodysplastic disease, not classified: Secondary | ICD-10-CM | POA: Diagnosis not present

## 2014-08-07 DIAGNOSIS — E43 Unspecified severe protein-calorie malnutrition: Secondary | ICD-10-CM | POA: Diagnosis not present

## 2014-08-07 DIAGNOSIS — K5900 Constipation, unspecified: Secondary | ICD-10-CM | POA: Diagnosis not present

## 2014-08-07 DIAGNOSIS — E860 Dehydration: Secondary | ICD-10-CM

## 2014-08-07 DIAGNOSIS — Z6826 Body mass index (BMI) 26.0-26.9, adult: Secondary | ICD-10-CM | POA: Insufficient documentation

## 2014-08-07 DIAGNOSIS — Z79899 Other long term (current) drug therapy: Secondary | ICD-10-CM | POA: Diagnosis not present

## 2014-08-07 DIAGNOSIS — F332 Major depressive disorder, recurrent severe without psychotic features: Secondary | ICD-10-CM

## 2014-08-07 DIAGNOSIS — S069X9A Unspecified intracranial injury with loss of consciousness of unspecified duration, initial encounter: Secondary | ICD-10-CM

## 2014-08-07 DIAGNOSIS — F329 Major depressive disorder, single episode, unspecified: Secondary | ICD-10-CM

## 2014-08-07 DIAGNOSIS — R55 Syncope and collapse: Secondary | ICD-10-CM | POA: Diagnosis present

## 2014-08-07 DIAGNOSIS — M542 Cervicalgia: Secondary | ICD-10-CM

## 2014-08-07 DIAGNOSIS — R05 Cough: Secondary | ICD-10-CM

## 2014-08-07 LAB — BASIC METABOLIC PANEL
ANION GAP: 11 (ref 5–15)
BUN: 25 mg/dL — ABNORMAL HIGH (ref 6–23)
CALCIUM: 8.7 mg/dL (ref 8.4–10.5)
CHLORIDE: 103 meq/L (ref 96–112)
CO2: 23 meq/L (ref 19–32)
Creatinine, Ser: 1.16 mg/dL (ref 0.50–1.35)
GFR calc Af Amer: 63 mL/min — ABNORMAL LOW (ref >90)
GFR calc non Af Amer: 55 mL/min — ABNORMAL LOW (ref >90)
Glucose, Bld: 112 mg/dL — ABNORMAL HIGH (ref 70–99)
Potassium: 4.5 mEq/L (ref 3.7–5.3)
SODIUM: 137 meq/L (ref 137–147)

## 2014-08-07 LAB — CBC WITH DIFFERENTIAL/PLATELET
Basophils Absolute: 0 10*3/uL (ref 0.0–0.1)
Basophils Relative: 1 % (ref 0–1)
EOS PCT: 2 % (ref 0–5)
Eosinophils Absolute: 0.1 10*3/uL (ref 0.0–0.7)
HCT: 28.2 % — ABNORMAL LOW (ref 39.0–52.0)
Hemoglobin: 9.7 g/dL — ABNORMAL LOW (ref 13.0–17.0)
Lymphocytes Relative: 24 % (ref 12–46)
Lymphs Abs: 0.9 10*3/uL (ref 0.7–4.0)
MCH: 32.3 pg (ref 26.0–34.0)
MCHC: 34.4 g/dL (ref 30.0–36.0)
MCV: 94 fL (ref 78.0–100.0)
Monocytes Absolute: 0.3 10*3/uL (ref 0.1–1.0)
Monocytes Relative: 9 % (ref 3–12)
NEUTROS PCT: 64 % (ref 43–77)
Neutro Abs: 2.4 10*3/uL (ref 1.7–7.7)
Platelets: 208 10*3/uL (ref 150–400)
RBC: 3 MIL/uL — AB (ref 4.22–5.81)
RDW: 28.7 % — ABNORMAL HIGH (ref 11.5–15.5)
WBC: 3.7 10*3/uL — AB (ref 4.0–10.5)

## 2014-08-07 LAB — URINALYSIS, ROUTINE W REFLEX MICROSCOPIC
Bilirubin Urine: NEGATIVE
Glucose, UA: NEGATIVE mg/dL
Hgb urine dipstick: NEGATIVE
Ketones, ur: NEGATIVE mg/dL
LEUKOCYTES UA: NEGATIVE
NITRITE: NEGATIVE
PROTEIN: NEGATIVE mg/dL
Specific Gravity, Urine: 1.022 (ref 1.005–1.030)
Urobilinogen, UA: 0.2 mg/dL (ref 0.0–1.0)
pH: 7 (ref 5.0–8.0)

## 2014-08-07 LAB — ETHANOL

## 2014-08-07 LAB — TROPONIN I: Troponin I: <0.3 ng/mL (ref <0.30)

## 2014-08-07 LAB — POC OCCULT BLOOD, ED: Fecal Occult Bld: NEGATIVE

## 2014-08-07 MED ORDER — ASPIRIN 81 MG PO CHEW
81.0000 mg | CHEWABLE_TABLET | Freq: Every day | ORAL | Status: DC
  Administered 2014-08-08 – 2014-08-11 (×4): 81 mg via ORAL
  Filled 2014-08-07 (×4): qty 1

## 2014-08-07 MED ORDER — SODIUM CHLORIDE 0.9 % IV SOLN
INTRAVENOUS | Status: DC
  Administered 2014-08-08 (×2): via INTRAVENOUS

## 2014-08-07 MED ORDER — HYDROXYUREA 500 MG PO CAPS
500.0000 mg | ORAL_CAPSULE | Freq: Every day | ORAL | Status: DC
  Administered 2014-08-08 – 2014-08-11 (×4): 500 mg via ORAL
  Filled 2014-08-07 (×4): qty 1

## 2014-08-07 MED ORDER — METOPROLOL SUCCINATE ER 50 MG PO TB24
50.0000 mg | ORAL_TABLET | Freq: Every day | ORAL | Status: DC
  Administered 2014-08-08 – 2014-08-10 (×3): 50 mg via ORAL
  Filled 2014-08-07 (×3): qty 1

## 2014-08-07 MED ORDER — BUDESONIDE-FORMOTEROL FUMARATE 160-4.5 MCG/ACT IN AERO
2.0000 | INHALATION_SPRAY | Freq: Two times a day (BID) | RESPIRATORY_TRACT | Status: DC
  Administered 2014-08-08 – 2014-08-11 (×6): 2 via RESPIRATORY_TRACT
  Filled 2014-08-07 (×3): qty 6

## 2014-08-07 MED ORDER — SERTRALINE HCL 50 MG PO TABS
50.0000 mg | ORAL_TABLET | Freq: Every day | ORAL | Status: DC
  Administered 2014-08-07: 50 mg via ORAL
  Filled 2014-08-07 (×2): qty 1

## 2014-08-07 MED ORDER — HEPARIN SODIUM (PORCINE) 5000 UNIT/ML IJ SOLN
5000.0000 [IU] | Freq: Three times a day (TID) | INTRAMUSCULAR | Status: DC
  Administered 2014-08-07 – 2014-08-11 (×11): 5000 [IU] via SUBCUTANEOUS
  Filled 2014-08-07 (×17): qty 1

## 2014-08-07 MED ORDER — SODIUM CHLORIDE 0.9 % IV SOLN
INTRAVENOUS | Status: AC
  Administered 2014-08-07: 14:00:00 via INTRAVENOUS

## 2014-08-07 MED ORDER — ISOSORBIDE MONONITRATE ER 30 MG PO TB24
30.0000 mg | ORAL_TABLET | Freq: Every day | ORAL | Status: DC
  Administered 2014-08-08 – 2014-08-11 (×4): 30 mg via ORAL
  Filled 2014-08-07 (×5): qty 1

## 2014-08-07 MED ORDER — SODIUM CHLORIDE 0.9 % IJ SOLN
3.0000 mL | Freq: Two times a day (BID) | INTRAMUSCULAR | Status: DC
  Administered 2014-08-07 – 2014-08-10 (×5): 3 mL via INTRAVENOUS

## 2014-08-07 MED ORDER — SODIUM CHLORIDE 0.9 % IV BOLUS (SEPSIS)
1000.0000 mL | Freq: Once | INTRAVENOUS | Status: AC
  Administered 2014-08-07: 1000 mL via INTRAVENOUS

## 2014-08-07 NOTE — Telephone Encounter (Signed)
Please thank [name] for her call . She may want to update Dr. Unice Cobble with [name] on her findings, but I see where [name] has been admitted to hospital today.  I suspect the care team will be assisting with evaluating home safety and coordinating that with his PCP as well.  THanks.

## 2014-08-07 NOTE — ED Provider Notes (Signed)
CSN: [id number]     Arrival date & time 08/07/14  1030 History   First MD Initiated Contact with Patient 08/07/14 1039     Chief Complaint  Patient presents with  . Loss of Consciousness     (Consider location/radiation/quality/duration/timing/severity/associated sxs/prior Treatment) HPI Comments: 80 year old male with history of high blood pressure, CAD, DVT, ulcers, anemia, history of alcohol abuse, depression, failure to thrive, myeloproliferative disease presents with syncope and depression. Patient has had gradually worsening depressive symptoms, no suicidal ideation, multiple deaths in family and close friends recently. Patient lives alone and uses a cane. He is brought in by a friend who helps take care of him. They recently discussed assisted living but no plan currently. Patient was seen in urgent care yesterday and recommended admission to the hospital at that time per report. Last night patient was walking and had more sudden onset syncope and hit the back of his head. Patient has mild posterior headache and neck pain since. No chest pain or new shortness of breath.  Patient is a 80 y.o. male presenting with syncope. The history is provided by the patient and a friend.  Loss of Consciousness Associated symptoms: headaches   Associated symptoms: no chest pain, no fever, no shortness of breath and no vomiting     Past Medical History  Diagnosis Date  . Loss of height   . Compression fracture   . Hypertension   . Hypercholesterolemia   . Rhinitis   . Dyspnea   . Weight loss   . CAD (coronary artery disease)   . Peptic ulcer disease   . Alcohol abuse   . AMI (acute mesenteric ischemia)   . Colon polyps   . Pleurisy with effusion   . Grief reaction   . Macular degeneration   . Leukocytosis, unspecified 05/07/2014  . Bilateral leg edema 05/07/2014  . Recurrent falls 05/07/2014   Past Surgical History  Procedure Laterality Date  . Exploratory laparotomy  1978    due to  kidney problems  . Thoracotomy  1982    hemothorax  . Back surgery  2003    Cervical Spondylosis  . Spine surgery  2003    C-spine  . Cardiac catheterization  06/21/03  . Coronary stent placement  07/2003    x3  . Coronary angioplasty with stent placement  05/2004  . Colonoscopy w/ polypectomy  11/2006   Family History  Problem Relation Age of Onset  . Diabetes Father   . Colon cancer Mother   . Colon cancer Son    History  Substance Use Topics  . Smoking status: Former Smoker    Quit date: 10/10/1946  . Smokeless tobacco: Never Used     Comment: cigars during college  . Alcohol Use: No     Comment: recovered ETOH abuser    Review of Systems  Constitutional: Positive for appetite change and fatigue. Negative for fever and chills.  HENT: Negative for congestion.   Eyes: Negative for visual disturbance.  Respiratory: Negative for shortness of breath.   Cardiovascular: Positive for syncope. Negative for chest pain.  Gastrointestinal: Negative for vomiting and abdominal pain.  Genitourinary: Negative for dysuria and flank pain.  Musculoskeletal: Positive for neck pain. Negative for back pain and neck stiffness.  Skin: Negative for rash.  Neurological: Positive for syncope and headaches. Negative for light-headedness.  Psychiatric/Behavioral: Positive for sleep disturbance and dysphoric mood.      Allergies  Cefprozil and Simvastatin  Home Medications   Prior to  Admission medications   Medication Sig Start Date End Date Taking? Authorizing Provider  aspirin 81 MG tablet Take 81 mg by mouth daily.     Yes Historical Provider, MD  budesonide-formoterol (SYMBICORT) 160-4.5 MCG/ACT inhaler Inhale 2 puffs into the lungs 2 (two) times daily.   Yes Historical Provider, MD  furosemide (LASIX) 40 MG tablet Take 1 tablet (40 mg total) by mouth daily. 04/30/14  Yes Hendricks Limes, MD  hydroxyurea (HYDREA) 500 MG capsule Take 1 capsule (500 mg total) by mouth daily. May take with  food to minimize GI side effects. 06/23/14  Yes Heath Lark, MD  metoprolol succinate (TOPROL-XL) 50 MG 24 hr tablet take 1/4 tablet by mouth twice a day 11/01/13  Yes Larey Dresser, MD  Multiple Minerals-Vitamins (CALCIUM & VIT D3 BONE HEALTH PO) Take by mouth daily.    Yes Historical Provider, MD  Multiple Vitamins-Minerals (ANTIOXIDANT) TABS Take by mouth daily.     Yes Historical Provider, MD  nitroGLYCERIN (NITROSTAT) 0.4 MG SL tablet Place 0.4 mg under the tongue every 5 (five) minutes as needed.     Yes Historical Provider, MD  Omega-3 Fatty Acids (FISH OIL) 1000 MG CAPS Take by mouth daily.   Yes Historical Provider, MD  sertraline (ZOLOFT) 50 MG tablet Take 1 tablet (50 mg total) by mouth daily. 07/29/14  Yes Hendricks Limes, MD  isosorbide mononitrate (IMDUR) 30 MG 24 hr tablet take 1 tablet by mouth once daily 06/04/14   Larey Dresser, MD   BP 133/55  Pulse 62  Temp(Src) 98 F (36.7 C) (Oral)  Resp 15  SpO2 100% Physical Exam  Nursing note and vitals reviewed. Constitutional: He is oriented to person, place, and time. He appears well-developed and well-nourished.  HENT:  Head: Normocephalic and atraumatic.  Eyes: Conjunctivae are normal. Right eye exhibits no discharge. Left eye exhibits no discharge.  Neck: Normal range of motion. Neck supple. No tracheal deviation present.  Cardiovascular: Normal rate and regular rhythm.   Pulmonary/Chest: Effort normal and breath sounds normal.  Abdominal: Soft. He exhibits no distension. There is no tenderness. There is no guarding.  Musculoskeletal: He exhibits no edema.  Neurological: He is alert and oriented to person, place, and time.  Patient moves all extremities equal bilateral, sensation equal upper and lower extremity, no arm drift. Excellent muscle function intact.  Skin: Skin is warm. No rash noted.  Psychiatric: He has a normal mood and affect. He expresses no suicidal ideation. He expresses no suicidal plans.  Mild flat  affect  soft spoken Clinically depressed    ED Course  Procedures (including critical care time) Labs Review Labs Reviewed  CBC WITH DIFFERENTIAL - Abnormal; Notable for the following:    WBC 3.7 (*)    RBC 3.00 (*)    Hemoglobin 9.7 (*)    HCT 28.2 (*)    RDW 28.7 (*)    All other components within normal limits  BASIC METABOLIC PANEL - Abnormal; Notable for the following:    Glucose, Bld 112 (*)    BUN 25 (*)    GFR calc non Af Amer 55 (*)    GFR calc Af Amer 63 (*)    All other components within normal limits  URINALYSIS, ROUTINE W REFLEX MICROSCOPIC - Abnormal; Notable for the following:    APPearance CLOUDY (*)    All other components within normal limits  TROPONIN I  ETHANOL  OCCULT BLOOD X 1 CARD TO LAB, STOOL  POC  OCCULT BLOOD, ED    Imaging Review Dg Chest 2 View  08/07/2014   CLINICAL DATA:  80 year old male with syncopal episode. High blood pressure. Bilateral leg edema. Past smoker. Initial encounter.  EXAM: CHEST  2 VIEW  COMPARISON:  04/30/2014 chest x-ray.  04/24/2014 chest seed.  FINDINGS: Pleural thickening and remote rib fractures unchanged. Blunting right costophrenic angle suggestive of scarring minimally changed from prior exams.  No infiltrate, congestive heart failure or pneumothorax.  Calcified mildly tortuous aorta.  No focal thoracic compression deformity.  Heart size within normal limits.  IMPRESSION: No acute abnormality.  Please see above.   Electronically Signed   By: Chauncey Cruel M.D.   On: 08/07/2014 12:11   Ct Head Wo Contrast  08/07/2014   CLINICAL DATA:  Syncope.  Fall.  Initial evaluation.  EXAM: CT HEAD WITHOUT CONTRAST  CT CERVICAL SPINE WITHOUT CONTRAST  TECHNIQUE: Multidetector CT imaging of the head and cervical spine was performed following the standard protocol without intravenous contrast. Multiplanar CT image reconstructions of the cervical spine were also generated.  COMPARISON:  05/05/2010  FINDINGS: CT HEAD FINDINGS  No intra-axial  or extra-axial pathologic fluid or blood collection. No mass. Diffuse cerebral atrophy. Mild ventriculomegaly consistent with degree of atrophy noted. These findings are stable. No acute bony abnormality identified.  CT CERVICAL SPINE FINDINGS  Diffuse severe degenerative change present. Multilevel degenerative changes. No evidence of fracture or dislocation. Pulmonary apices are clear.  IMPRESSION: 1. No acute intracranial abnormality. 2. Diffuse cervical spine degenerative change.   Electronically Signed   By: Marcello Moores  Register   On: 08/07/2014 12:50   Ct Cervical Spine Wo Contrast  08/07/2014   CLINICAL DATA:  Syncope.  Fall.  Initial evaluation.  EXAM: CT HEAD WITHOUT CONTRAST  CT CERVICAL SPINE WITHOUT CONTRAST  TECHNIQUE: Multidetector CT imaging of the head and cervical spine was performed following the standard protocol without intravenous contrast. Multiplanar CT image reconstructions of the cervical spine were also generated.  COMPARISON:  05/05/2010  FINDINGS: CT HEAD FINDINGS  No intra-axial or extra-axial pathologic fluid or blood collection. No mass. Diffuse cerebral atrophy. Mild ventriculomegaly consistent with degree of atrophy noted. These findings are stable. No acute bony abnormality identified.  CT CERVICAL SPINE FINDINGS  Diffuse severe degenerative change present. Multilevel degenerative changes. No evidence of fracture or dislocation. Pulmonary apices are clear.  IMPRESSION: 1. No acute intracranial abnormality. 2. Diffuse cervical spine degenerative change.   Electronically Signed   By: Marcello Moores  Register   On: 08/07/2014 12:50     EKG Interpretation   Date/Time:  Thursday August 07 2014 10:49:00 EDT Ventricular Rate:  62 PR Interval:  204 QRS Duration: 89 QT Interval:  391 QTC Calculation: 397 R Axis:   65 Text Interpretation:  Sinus rhythm Low voltage, precordial leads Abnormal  R-wave progression, early transition Confirmed by Mithran Strike  MD, Shirlean Berman  ([id number]) on 08/07/2014  10:54:25 AM      MDM   Final diagnoses:  Acute head injury with loss of consciousness  Neck pain  Cough  Dehydration  Depression  Patient with clinical depression worsening, decreased ability to care for himself, discussed the likely need assisted living or more assistance at home in the near future.  Patient is on narcotics different pain medicines and is not sure exactly why.  Patient had syncope more acute onset with cardiac history plan for observation in the hospital. Mild anemia. Patient denies blood in the stools, Hemoccult pending. EKG no acute findings.  Patient  with fall from syncope, CT head and neck pending.  Plan for observation/admission to the hospital for syncope and further workup.  The patients results and plan were reviewed and discussed.   Any x-rays performed were personally reviewed by myself.   Differential diagnosis were considered with the presenting HPI.  Medications  heparin injection 5,000 Units (not administered)  sodium chloride 0.9 % injection 3 mL (not administered)  0.9 %  sodium chloride infusion (not administered)  0.9 %  sodium chloride infusion (not administered)  aspirin chewable tablet 81 mg (not administered)  budesonide-formoterol (SYMBICORT) 160-4.5 MCG/ACT inhaler 2 puff (not administered)  hydroxyurea (HYDREA) capsule 500 mg (not administered)  isosorbide mononitrate (IMDUR) 24 hr tablet 30 mg (not administered)  metoprolol succinate (TOPROL-XL) 24 hr tablet 50 mg (not administered)  sertraline (ZOLOFT) tablet 50 mg (not administered)  sodium chloride 0.9 % bolus 1,000 mL (0 mLs Intravenous Stopped 08/07/14 1259)    Filed Vitals:   08/07/14 1122 08/07/14 1158 08/07/14 1259 08/07/14 1346  BP: 103/51 114/57 112/79 133/55  Pulse: 66 60 69 62  Temp:    98 F (36.7 C)  TempSrc:    Oral  Resp: 18 14 14 15   SpO2: 97% 99% 96% 100%    Final diagnoses:  Acute head injury with loss of consciousness  Neck pain  Cough  Dehydration     Admission/ observation were discussed with the admitting physician, patient and/or family and they are comfortable with the plan.     Mariea Clonts, MD 08/07/14 [phone]

## 2014-08-07 NOTE — H&P (Signed)
[facility] History and Physical  Daniel Ali:[id number] DOB: 02-12-1925 DOA: 08/07/2014  Referring physician: Emergency Department PCP: Unice Cobble, MD  Specialists:   Chief Complaint: Syncope  HPI: Daniel Ali is a 80 y.o. male  With a hx of depression, htn, compression fx, prior ETOH abuse who presents with syncopal episode while walking on day of admission. Pt admits to not eating well recently and noticed markedly decreased urine output and dry mouth. Pt is currently feeling quite depressed about the recent loss of his neighbor, significant other, and son all from cancer within the past month. He denies suicidal ideations. In the ED, pt was noted to have a mildly elevated BUN. He was given aggressive IVF before orthostatics were obtained. Pt otherwise denies chest pains or sob. Hospitalist service was consulted for consideration for admission for syncope work up.  Review of Systems:   Per above, the remainder of the 10pt ros reviewed and are neg  Past Medical History  Diagnosis Date  . Loss of height   . Compression fracture   . Hypertension   . Hypercholesterolemia   . Rhinitis   . Dyspnea   . Weight loss   . CAD (coronary artery disease)   . Peptic ulcer disease   . Alcohol abuse   . AMI (acute mesenteric ischemia)   . Colon polyps   . Pleurisy with effusion   . Grief reaction   . Macular degeneration   . Leukocytosis, unspecified 05/07/2014  . Bilateral leg edema 05/07/2014  . Recurrent falls 05/07/2014   Past Surgical History  Procedure Laterality Date  . Exploratory laparotomy  1978    due to kidney problems  . Thoracotomy  1982    hemothorax  . Back surgery  2003    Cervical Spondylosis  . Spine surgery  2003    C-spine  . Cardiac catheterization  06/21/03  . Coronary stent placement  07/2003    x3  . Coronary angioplasty with stent placement  05/2004  . Colonoscopy w/ polypectomy  11/2006   Social History:  reports that he quit  smoking about 67 years ago. He has never used smokeless tobacco. He reports that he does not drink alcohol or use illicit drugs.  where does patient live--home, ALF, SNF? and with whom if at home?  Can patient participate in ADLs?  Allergies  Allergen Reactions  . Cefprozil     REACTION: insomnia  . Simvastatin     REACTION: MUSCLE ACHES    Family History  Problem Relation Age of Onset  . Diabetes Father   . Colon cancer Mother   . Colon cancer Son     (be sure to complete)  Prior to Admission medications   Medication Sig Start Date End Date Taking? Authorizing Provider  aspirin 81 MG tablet Take 81 mg by mouth daily.     Yes Historical Provider, MD  budesonide-formoterol (SYMBICORT) 160-4.5 MCG/ACT inhaler Inhale 2 puffs into the lungs 2 (two) times daily.   Yes Historical Provider, MD  furosemide (LASIX) 40 MG tablet Take 1 tablet (40 mg total) by mouth daily. 04/30/14  Yes Hendricks Limes, MD  hydroxyurea (HYDREA) 500 MG capsule Take 1 capsule (500 mg total) by mouth daily. May take with food to minimize GI side effects. 06/23/14  Yes Heath Lark, MD  metoprolol succinate (TOPROL-XL) 50 MG 24 hr tablet take 1/4 tablet by mouth twice a day 11/01/13  Yes Larey Dresser, MD  Multiple Minerals-Vitamins (CALCIUM &  VIT D3 BONE HEALTH PO) Take by mouth daily.    Yes Historical Provider, MD  Multiple Vitamins-Minerals (ANTIOXIDANT) TABS Take by mouth daily.     Yes Historical Provider, MD  nitroGLYCERIN (NITROSTAT) 0.4 MG SL tablet Place 0.4 mg under the tongue every 5 (five) minutes as needed.     Yes Historical Provider, MD  Omega-3 Fatty Acids (FISH OIL) 1000 MG CAPS Take by mouth daily.   Yes Historical Provider, MD  sertraline (ZOLOFT) 50 MG tablet Take 1 tablet (50 mg total) by mouth daily. 07/29/14  Yes Hendricks Limes, MD  isosorbide mononitrate (IMDUR) 30 MG 24 hr tablet take 1 tablet by mouth once daily 06/04/14   Larey Dresser, MD   Physical Exam: Filed Vitals:   08/07/14  1048 08/07/14 1122 08/07/14 1158 08/07/14 1259  BP: 111/63 103/51 114/57 112/79  Pulse: 62 66 60 69  Temp: 97.9 F (36.6 C)     TempSrc: Oral     Resp: 16 18 14 14   SpO2: 97% 97% 99% 96%     General:  Awake, in nad  Eyes: PERRL B  ENT: membranes dry, dentition fair  Neck: trachea midline, neck supple  Cardiovascular: regular, s1, s2  Respiratory: normal resp effort, no wheezing  Abdomen: soft,nondistended  Skin: decreased skin turgor, no abnormal skin lesions seen  Musculoskeletal: perfused, no clubbing  Psychiatric: flat affect, depressed  Neurologic: cn2-12 grossly intact, strength/sensation intact  Labs on Admission:  Basic Metabolic Panel:  Recent Labs Lab 08/07/14 1114  NA 137  K 4.5  CL 103  CO2 23  GLUCOSE 112*  BUN 25*  CREATININE 1.16  CALCIUM 8.7   Liver Function Tests: No results found for this basename: AST, ALT, ALKPHOS, BILITOT, PROT, ALBUMIN,  in the last 168 hours No results found for this basename: LIPASE, AMYLASE,  in the last 168 hours No results found for this basename: AMMONIA,  in the last 168 hours CBC:  Recent Labs Lab 08/07/14 1114  WBC 3.7*  NEUTROABS 2.4  HGB 9.7*  HCT 28.2*  MCV 94.0  PLT 208   Cardiac Enzymes:  Recent Labs Lab 08/07/14 1139  TROPONINI <0.30    BNP (last 3 results) No results found for this basename: PROBNP,  in the last 8760 hours CBG: No results found for this basename: GLUCAP,  in the last 168 hours  Radiological Exams on Admission: Dg Chest 2 View  08/07/2014   CLINICAL DATA:  80 year old male with syncopal episode. High blood pressure. Bilateral leg edema. Past smoker. Initial encounter.  EXAM: CHEST  2 VIEW  COMPARISON:  04/30/2014 chest x-ray.  04/24/2014 chest seed.  FINDINGS: Pleural thickening and remote rib fractures unchanged. Blunting right costophrenic angle suggestive of scarring minimally changed from prior exams.  No infiltrate, congestive heart failure or pneumothorax.   Calcified mildly tortuous aorta.  No focal thoracic compression deformity.  Heart size within normal limits.  IMPRESSION: No acute abnormality.  Please see above.   Electronically Signed   By: Chauncey Cruel M.D.   On: 08/07/2014 12:11   Ct Head Wo Contrast  08/07/2014   CLINICAL DATA:  Syncope.  Fall.  Initial evaluation.  EXAM: CT HEAD WITHOUT CONTRAST  CT CERVICAL SPINE WITHOUT CONTRAST  TECHNIQUE: Multidetector CT imaging of the head and cervical spine was performed following the standard protocol without intravenous contrast. Multiplanar CT image reconstructions of the cervical spine were also generated.  COMPARISON:  05/05/2010  FINDINGS: CT HEAD FINDINGS  No intra-axial  or extra-axial pathologic fluid or blood collection. No mass. Diffuse cerebral atrophy. Mild ventriculomegaly consistent with degree of atrophy noted. These findings are stable. No acute bony abnormality identified.  CT CERVICAL SPINE FINDINGS  Diffuse severe degenerative change present. Multilevel degenerative changes. No evidence of fracture or dislocation. Pulmonary apices are clear.  IMPRESSION: 1. No acute intracranial abnormality. 2. Diffuse cervical spine degenerative change.   Electronically Signed   By: Marcello Moores  Register   On: 08/07/2014 12:50   Ct Cervical Spine Wo Contrast  08/07/2014   CLINICAL DATA:  Syncope.  Fall.  Initial evaluation.  EXAM: CT HEAD WITHOUT CONTRAST  CT CERVICAL SPINE WITHOUT CONTRAST  TECHNIQUE: Multidetector CT imaging of the head and cervical spine was performed following the standard protocol without intravenous contrast. Multiplanar CT image reconstructions of the cervical spine were also generated.  COMPARISON:  05/05/2010  FINDINGS: CT HEAD FINDINGS  No intra-axial or extra-axial pathologic fluid or blood collection. No mass. Diffuse cerebral atrophy. Mild ventriculomegaly consistent with degree of atrophy noted. These findings are stable. No acute bony abnormality identified.  CT CERVICAL SPINE  FINDINGS  Diffuse severe degenerative change present. Multilevel degenerative changes. No evidence of fracture or dislocation. Pulmonary apices are clear.  IMPRESSION: 1. No acute intracranial abnormality. 2. Diffuse cervical spine degenerative change.   Electronically Signed   By: Marcello Moores  Register   On: 08/07/2014 12:50    EKG: Independently reviewed. NSR  Assessment/Plan Principal Problem:   Syncope Active Problems:   Constipation   Recurrent falls   Hypotension  1. Syncope 1. History and findings are highly suggestive of mild dehydration from decreased PO intake, especially in the setting of profound depression (see below) 2. Pt already received aggressive IVF in the ED, so unsure of how useful orthostatics would be at this time. Will order anyway 3. Cont IVF for now and monitor renal fx 4. Admit to med-tele 5. Will check 2d echo 2. Falls 1. Suspect secondary to dehydration per above 3. Depression 1. Denies suicidal ideations 2. Will consult Psychiatry 4. COPD 1. Stable. No wheezing 5. DVT prophylaxis 1. Heparin subQ  Code Status: Full (must indicate code status--if unknown or must be presumed, indicate so) Family Communication: Pt in room (indicate person spoken with, if applicable, with phone number if by telephone) Disposition Plan: Pending (indicate anticipated LOS)  Time spent: 28min  [facility] Pager [phone]  If 7PM-7AM, please contact night-coverage [email or web address] Password [email or web address] 08/07/2014, 1:23 PM

## 2014-08-07 NOTE — Telephone Encounter (Signed)
Lm for rtn call 

## 2014-08-07 NOTE — ED Notes (Signed)
Pt is here in ED with concerned friend. Pt and friend report that pt has been depressed. Pt saw pcp last week. Friend thinks pt is on too many medications and needs to be taken off some of his meds. Friend reports pt had syncopal episode/LOC last night and hit his head. Pt reports he does not remember falling. Pt denies pain.

## 2014-08-07 NOTE — ED Notes (Addendum)
Per pt fell in entry way at 0400 this am resulting in neck pain. Upon assessment head/neck skin clean, dry, and intact. Pt hx of depression. Pt denies SI/HI but friend at bedside reports "he has profound depression and is not taking care of himself."

## 2014-08-07 NOTE — Telephone Encounter (Signed)
THIS MESSAGE IS FOR DR. Carlota Raspberry: [place] AND STATED THAT DR. GREENE WANTED HER TO GO TO SEE MR. Perrow FOR AN ACCESSMENT. SHE JUST WANTED TO TALK WITH HIM ABOUT WHAT SHE FOUND. SHE WANTS HIM TO KNOW THAT MR. Huard WAS CONFUSED AND HAD ERRORS WITH TAKING HIS MEDICATIONS. SHE SAID OVERALL, HE WAS GETTING AROUND ALL RIGHT. PLEASE RETURN HER CALL. BEST PHONE [phone] ([place])  [place]

## 2014-08-07 NOTE — Progress Notes (Signed)
  CARE MANAGEMENT ED NOTE 08/07/2014  Patient:  Daniel Ali, Daniel Ali   Account Number:  [id number]  Date Initiated:  08/07/2014  Documentation initiated by:  Livia Snellen  Subjective/Objective Assessment:   Patient presents to Ed with syncopal episode, decresed po intake, decreased urine output     Subjective/Objective Assessment Detail:   Patient with pmhx of alcohol abuse, HTN, CAD, macular dgeneration, pleurisy with effusion, high cholesterol     Action/Plan:   Action/Plan Detail:   Anticipated DC Date:       Status Recommendation to Physician:   Result of Recommendation:    Other ED Services  Consult Working [facility]  CM consult  Other    Choice offered to / List presented to:           [place]    Status of service:  Completed, signed off  ED Comments:   ED Comments Detail:  EDCM spoke to patient at bedside.  Patient reports he lives alone.  Patient confirms he is currently receiving home health services with [facility].  Patient reports he has seen the visiting RN, has not seen PT yet.  Patient has red Teacher, early years/pre with him.  Patient reports his POA is his son Daniel Ali who lives in [place].  Daniel Ali's phone number [phone].  Patient confirms his pcp is Dr. Unice Cobble and he is also a patient at the [facility] in [place].  Patient has a cane, shower chair, and bedside commode at home.  Patient reports he is able to complete his ADL's on his own.  No further EDCM needs at this time.

## 2014-08-08 DIAGNOSIS — F332 Major depressive disorder, recurrent severe without psychotic features: Secondary | ICD-10-CM

## 2014-08-08 DIAGNOSIS — R55 Syncope and collapse: Secondary | ICD-10-CM

## 2014-08-08 DIAGNOSIS — R45851 Suicidal ideations: Secondary | ICD-10-CM

## 2014-08-08 DIAGNOSIS — E860 Dehydration: Secondary | ICD-10-CM

## 2014-08-08 DIAGNOSIS — F329 Major depressive disorder, single episode, unspecified: Secondary | ICD-10-CM

## 2014-08-08 DIAGNOSIS — D471 Chronic myeloproliferative disease: Secondary | ICD-10-CM

## 2014-08-08 DIAGNOSIS — E43 Unspecified severe protein-calorie malnutrition: Secondary | ICD-10-CM | POA: Insufficient documentation

## 2014-08-08 DIAGNOSIS — R296 Repeated falls: Secondary | ICD-10-CM

## 2014-08-08 DIAGNOSIS — D630 Anemia in neoplastic disease: Secondary | ICD-10-CM

## 2014-08-08 MED ORDER — ENSURE COMPLETE PO LIQD
237.0000 mL | Freq: Two times a day (BID) | ORAL | Status: DC
  Administered 2014-08-08 – 2014-08-10 (×5): 237 mL via ORAL

## 2014-08-08 MED ORDER — DULOXETINE HCL 30 MG PO CPEP
30.0000 mg | ORAL_CAPSULE | Freq: Every day | ORAL | Status: DC
  Administered 2014-08-08 – 2014-08-10 (×3): 30 mg via ORAL
  Filled 2014-08-08 (×4): qty 1

## 2014-08-08 MED ORDER — MIRTAZAPINE 15 MG PO TBDP
15.0000 mg | ORAL_TABLET | Freq: Every day | ORAL | Status: DC
  Administered 2014-08-08 – 2014-08-10 (×3): 15 mg via ORAL
  Filled 2014-08-08 (×4): qty 1

## 2014-08-08 NOTE — Progress Notes (Signed)
Chart reviewed.   [facility] PROGRESS NOTE  MCLAIN FREER VAN:[id number] DOB: 23-May-1925 DOA: 08/07/2014 PCP: Unice Cobble, MD  Assessment/Plan:  Principal Problem:   Syncope likely from dehydration, malnutrition. Echo pending. Orthostatics never done. Will order. Monitor off IVF. Active Problems:   Coronary atherosclerosis   Constipation   Recurrent falls: PT/OT recommending 24 hour supervision/ALF. Closest family member in [place]. Will consult SW to look into ALF. If unable to afford, or unwilling, will have to go home with home health. Check b12, folate   Myeloproliferative disease   Depression likely leading to weight loss, poor intake. meds adjusted by psychiatry Anemia: check anemia panel.  Code Status:  full Family Communication:   Disposition Plan:  ALF?  Consultants:  psychiatry  Procedures:     Antibiotics:    HPI/Subjective: Feels stronger and more hopeful today. Ate 50% meals  Objective: Filed Vitals:   08/08/14 1412  BP: 100/68  Pulse: 64  Temp: 97.5 F (36.4 C)  Resp: 16    Intake/Output Summary (Last 24 hours) at 08/08/14 1439 Last data filed at 08/08/14 1300  Gross per 24 hour  Intake   1560 ml  Output    700 ml  Net    860 ml   There were no vitals filed for this visit.  Exam:   General:  Working with OT. A and o. At side of bed  Cardiovascular: RRR without MGR  Respiratory: CTA without WRR  Abdomen: S, NT, ND  Ext: no CCE  Basic Metabolic Panel:  Recent Labs Lab 08/07/14 1114  NA 137  K 4.5  CL 103  CO2 23  GLUCOSE 112*  BUN 25*  CREATININE 1.16  CALCIUM 8.7   Liver Function Tests: No results found for this basename: AST, ALT, ALKPHOS, BILITOT, PROT, ALBUMIN,  in the last 168 hours No results found for this basename: LIPASE, AMYLASE,  in the last 168 hours No results found for this basename: AMMONIA,  in the last 168 hours CBC:  Recent Labs Lab 08/07/14 1114  WBC 3.7*  NEUTROABS 2.4  HGB  9.7*  HCT 28.2*  MCV 94.0  PLT 208   Cardiac Enzymes:  Recent Labs Lab 08/07/14 1139  TROPONINI <0.30   BNP (last 3 results) No results found for this basename: PROBNP,  in the last 8760 hours CBG: No results found for this basename: GLUCAP,  in the last 168 hours  No results found for this or any previous visit (from the past 240 hour(s)).   Studies: Dg Chest 2 View  08/07/2014   CLINICAL DATA:  80 year old male with syncopal episode. High blood pressure. Bilateral leg edema. Past smoker. Initial encounter.  EXAM: CHEST  2 VIEW  COMPARISON:  04/30/2014 chest x-ray.  04/24/2014 chest seed.  FINDINGS: Pleural thickening and remote rib fractures unchanged. Blunting right costophrenic angle suggestive of scarring minimally changed from prior exams.  No infiltrate, congestive heart failure or pneumothorax.  Calcified mildly tortuous aorta.  No focal thoracic compression deformity.  Heart size within normal limits.  IMPRESSION: No acute abnormality.  Please see above.   Electronically Signed   By: Chauncey Cruel M.D.   On: 08/07/2014 12:11   Ct Head Wo Contrast  08/07/2014   CLINICAL DATA:  Syncope.  Fall.  Initial evaluation.  EXAM: CT HEAD WITHOUT CONTRAST  CT CERVICAL SPINE WITHOUT CONTRAST  TECHNIQUE: Multidetector CT imaging of the head and cervical spine was performed following the standard protocol without intravenous contrast. Multiplanar CT image  reconstructions of the cervical spine were also generated.  COMPARISON:  05/05/2010  FINDINGS: CT HEAD FINDINGS  No intra-axial or extra-axial pathologic fluid or blood collection. No mass. Diffuse cerebral atrophy. Mild ventriculomegaly consistent with degree of atrophy noted. These findings are stable. No acute bony abnormality identified.  CT CERVICAL SPINE FINDINGS  Diffuse severe degenerative change present. Multilevel degenerative changes. No evidence of fracture or dislocation. Pulmonary apices are clear.  IMPRESSION: 1. No acute  intracranial abnormality. 2. Diffuse cervical spine degenerative change.   Electronically Signed   By: Marcello Moores  Register   On: 08/07/2014 12:50   Ct Cervical Spine Wo Contrast  08/07/2014   CLINICAL DATA:  Syncope.  Fall.  Initial evaluation.  EXAM: CT HEAD WITHOUT CONTRAST  CT CERVICAL SPINE WITHOUT CONTRAST  TECHNIQUE: Multidetector CT imaging of the head and cervical spine was performed following the standard protocol without intravenous contrast. Multiplanar CT image reconstructions of the cervical spine were also generated.  COMPARISON:  05/05/2010  FINDINGS: CT HEAD FINDINGS  No intra-axial or extra-axial pathologic fluid or blood collection. No mass. Diffuse cerebral atrophy. Mild ventriculomegaly consistent with degree of atrophy noted. These findings are stable. No acute bony abnormality identified.  CT CERVICAL SPINE FINDINGS  Diffuse severe degenerative change present. Multilevel degenerative changes. No evidence of fracture or dislocation. Pulmonary apices are clear.  IMPRESSION: 1. No acute intracranial abnormality. 2. Diffuse cervical spine degenerative change.   Electronically Signed   By: Marcello Moores  Register   On: 08/07/2014 12:50    Scheduled Meds: . aspirin  81 mg Oral Daily  . budesonide-formoterol  2 puff Inhalation BID  . DULoxetine  30 mg Oral Daily  . heparin  5,000 Units Subcutaneous 3 times per day  . hydroxyurea  500 mg Oral Daily  . isosorbide mononitrate  30 mg Oral Daily  . metoprolol succinate  50 mg Oral Daily  . mirtazapine  15 mg Oral QHS  . sodium chloride  3 mL Intravenous Q12H   Continuous Infusions: . sodium chloride 100 mL/hr at 08/08/14 1309    Time spent: 35 minutes  [facility] Pager [phone]. If 7PM-7AM, please contact night-coverage at [email or web address], password [facility] 08/08/2014, 2:39 PM  LOS: 1 day

## 2014-08-08 NOTE — Care Management Note (Addendum)
    Page 1 of 1   08/12/2014     10:31:14 AM CARE MANAGEMENT NOTE 08/12/2014  Patient:  ARMISTEAD, SULT   Account Number:  [id number]  Date Initiated:  08/08/2014  Documentation initiated by:  Yoona Ishii  Subjective/Objective Assessment:   Pt adm on 08/07/14 with syncope, fall.  PTA, pt resides at home alone and is active with [facility] for RN, PT, OT and aide.     Action/Plan:   Alvis Lemmings to resume services at discharge.  Will follow progress.  PT/OT recommending 24hr supervision at dc--may need SNF at dc, if support unavailable.  CSW to see.   Anticipated DC Date:  08/11/2014   Anticipated DC Plan:  SKILLED NURSING FACILITY  In-house referral  Clinical Social Worker      DC Planning Services  CM consult      Choice offered to / List presented to:             Status of service:  Completed, signed off Medicare Important Message given?   (If response is "NO", the following Medicare IM given date fields will be blank) Date Medicare IM given:   Medicare IM given by:   Date Additional Medicare IM given:   Additional Medicare IM given by:    Discharge Disposition:  [place]  Per UR Regulation:  Reviewed for med. necessity/level of care/duration of stay  If discussed at [place] of Stay Meetings, dates discussed:    Comments:  08/11/14 Ellan Lambert, RN, BSN [phone] Pt discharging to SNF today, per CSW arrangements.  10/10/13 14:50 CM notes pt has made decision to go to ALF; CSW arranging.  No other CM needs were comunicated.  Mariane Masters, BSN, CM [phone].
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
## 2014-08-08 NOTE — Clinical Social Work Note (Signed)
Social work received referral to discuss with patient what ALFs are available in the area.  CSW did not get to see, will have weekend CSW meet with patient.  Jones Broom. [place], MSW, [place] 08/08/2014 5:27 PM

## 2014-08-08 NOTE — Progress Notes (Signed)
UR completed 

## 2014-08-08 NOTE — Consult Note (Signed)
Daniel Ali   Reason for Ali:  depression, prior ETOH abuse with syncopal episode. Referring Physician:  Dr. Wyline Copas / Daniel Ali Ali an 80 y.o. male. Total Time spent with patient: 45 minutes  Assessment: AXIS I:  Major Depression, Recurrent severe and grief AXIS II:  Deferred AXIS III:   Past Medical History  Diagnosis Date  . Loss of height   . Compression fracture   . Hypertension   . Hypercholesterolemia   . Rhinitis   . Dyspnea   . Weight loss   . CAD (coronary artery disease)   . Peptic ulcer disease   . Alcohol abuse   . AMI (acute mesenteric ischemia)   . Colon polyps   . Pleurisy with effusion   . Grief reaction   . Macular degeneration   . Leukocytosis, unspecified 05/07/2014  . Bilateral leg edema 05/07/2014  . Recurrent falls 05/07/2014   AXIS IV:  other psychosocial or environmental problems, problems related to social environment and problems with primary support group AXIS V:  41-50 serious symptoms  Plan: Case will be discussed with Dr. Wyline Copas and [place] Await for case management if needed temp placement out side home Discontinue Sertraline - not helpful Check TSH and free T3 Start Cymbalta 30 mg Qam for depression  Start Remeron 15 mg PO Qhs for insomnia and appetite No evidence of imminent risk to self or others at present.   Patient does not meet criteria for psychiatric inpatient admission. Supportive therapy provided about ongoing stressors. Discussed crisis plan, support from social network, calling 911, coming to the Emergency Department, and calling Suicide Hotline. Appreciate psychiatric consultation and follow up as needed Please contact [phone] if needs further assistance  Subjective:   Daniel Ali a 80 y.o. male patient admitted with depression, dehydration with syncopal episode.  HPI:  Daniel Ali a 80 y.o. male seen for psych consultation and evaluation for depression over one  year. Patient reported that he has been depressed, loss of interest, loss of motivation, not actively participating in his home or garden work, less socialization, more isolation, disturbed sleep and lost appetite and feels lost about one hundred pounds in the last one year. He has been receiving zoloft from primary care and has no psych providers. He has home health care has been coming home three times a week to fix his medications and reportedly been complaint with medications. He has been drinking since age 44 and last use was at age 71. He was retired Chief Financial Officer. He has family out of state and been in regular communication and has few friends locally. He Ali calm and cooperative. He has intact cognition except mild deficits with memory. He Ali currently feeling quite depressed about the recent loss of his neighbor for a cancer less than a year, significant other for cancer more than a year ago and son from cancer about six to ten years ago. He has suicidal ideations but denied intention or plans.He has no homicidal ideation, intention or plans and has no evidence of psychosis.  Medical history: Patient with a hx of depression, htn, status post three stents, compression fx, prior ETOH abuse who presents with syncopal episode while walking on day of admission. Pt admits to not eating well recently and noticed markedly decreased urine output and dry mouth.  In the ED, pt was noted to have a mildly elevated BUN. He was given aggressive IVF before orthostatics were obtained. Pt otherwise denies chest  pains or sob. Hospitalist service was consulted for consideration for admission for syncope work up.   Review of Systems:  Per above, the remainder of the 10pt ros reviewed and are neg  HPI Elements:   Location:  depression. Quality:  poor. Severity:  moderate. Timing:  unknown stresses.  Past Psychiatric History: Past Medical History  Diagnosis Date  . Loss of height   . Compression fracture   .  Hypertension   . Hypercholesterolemia   . Rhinitis   . Dyspnea   . Weight loss   . CAD (coronary artery disease)   . Peptic ulcer disease   . Alcohol abuse   . AMI (acute mesenteric ischemia)   . Colon polyps   . Pleurisy with effusion   . Grief reaction   . Macular degeneration   . Leukocytosis, unspecified 05/07/2014  . Bilateral leg edema 05/07/2014  . Recurrent falls 05/07/2014    reports that he quit smoking about 67 years ago. He has never used smokeless tobacco. He reports that he does not drink alcohol or use illicit drugs. Family History  Problem Relation Age of Onset  . Diabetes Father   . Colon cancer Mother   . Colon cancer Son      Living Arrangements: Alone   Abuse/Neglect [facility]) Physical Abuse: Denies Verbal Abuse: Denies Sexual Abuse: Denies Allergies:   Allergies  Allergen Reactions  . Cefprozil     REACTION: insomnia  . Simvastatin     REACTION: MUSCLE ACHES    ACT Assessment Complete:  NO  Objective: Blood pressure 118/46, pulse 63, temperature 98.8 F (37.1 C), temperature source Oral, resp. rate 18, height '5\' 7"'  (1.702 m), SpO2 97.00%.There Ali no weight on file to calculate BMI. Results for orders placed during the hospital encounter of 08/07/14 (from the past 72 hour(s))  CBC WITH DIFFERENTIAL     Status: Abnormal   Collection Time    08/07/14 11:14 AM      Result Value Ref Range   WBC 3.7 (*) 4.0 - 10.5 K/uL   RBC 3.00 (*) 4.22 - 5.81 MIL/uL   Hemoglobin 9.7 (*) 13.0 - 17.0 g/dL   HCT 28.2 (*) 39.0 - 52.0 %   MCV 94.0  78.0 - 100.0 fL   MCH 32.3  26.0 - 34.0 pg   MCHC 34.4  30.0 - 36.0 g/dL   RDW 28.7 (*) 11.5 - 15.5 %   Platelets 208  150 - 400 K/uL   Neutrophils Relative % 64  43 - 77 %   Lymphocytes Relative 24  12 - 46 %   Monocytes Relative 9  3 - 12 %   Eosinophils Relative 2  0 - 5 %   Basophils Relative 1  0 - 1 %   Neutro Abs 2.4  1.7 - 7.7 K/uL   Lymphs Abs 0.9  0.7 - 4.0 K/uL   Monocytes Absolute 0.3  0.1 - 1.0 K/uL    Eosinophils Absolute 0.1  0.0 - 0.7 K/uL   Basophils Absolute 0.0  0.0 - 0.1 K/uL   RBC Morphology POLYCHROMASIA PRESENT     Comment: ELLIPTOCYTES     SCHISTOCYTES PRESENT (3-0/ZSW)  BASIC METABOLIC PANEL     Status: Abnormal   Collection Time    08/07/14 11:14 AM      Result Value Ref Range   Sodium 137  137 - 147 mEq/L   Potassium 4.5  3.7 - 5.3 mEq/L   Chloride 103  96 - 112 mEq/L  CO2 23  19 - 32 mEq/L   Glucose, Bld 112 (*) 70 - 99 mg/dL   BUN 25 (*) 6 - 23 mg/dL   Creatinine, Ser 1.16  0.50 - 1.35 mg/dL   Calcium 8.7  8.4 - 10.5 mg/dL   GFR calc non Af Amer 55 (*) >90 mL/min   GFR calc Af Amer 63 (*) >90 mL/min   Comment: (NOTE)     The eGFR has been calculated using the CKD EPI equation.     This calculation has not been validated in all clinical situations.     eGFR's persistently <90 mL/min signify possible Chronic Kidney     Disease.   Anion gap 11  5 - 15  TROPONIN I     Status: None   Collection Time    08/07/14 11:39 AM      Result Value Ref Range   Troponin I <0.30  <0.30 ng/mL   Comment:            Due to the release kinetics of cTnI,     a negative result within the first hours     of the onset of symptoms does not rule out     myocardial infarction with certainty.     If myocardial infarction Ali still suspected,     repeat the test at appropriate intervals.  ETHANOL     Status: None   Collection Time    08/07/14 11:39 AM      Result Value Ref Range   Alcohol, Ethyl (B) <11  0 - 11 mg/dL   Comment:            LOWEST DETECTABLE LIMIT FOR     SERUM ALCOHOL Ali 11 mg/dL     FOR MEDICAL PURPOSES ONLY  POC OCCULT BLOOD, ED     Status: None   Collection Time    08/07/14  1:05 PM      Result Value Ref Range   Fecal Occult Bld NEGATIVE  NEGATIVE  URINALYSIS, ROUTINE W REFLEX MICROSCOPIC     Status: Abnormal   Collection Time    08/07/14  1:48 PM      Result Value Ref Range   Color, Urine YELLOW  YELLOW   APPearance CLOUDY (*) CLEAR   Specific Gravity,  Urine 1.022  1.005 - 1.030   pH 7.0  5.0 - 8.0   Glucose, UA NEGATIVE  NEGATIVE mg/dL   Hgb urine dipstick NEGATIVE  NEGATIVE   Bilirubin Urine NEGATIVE  NEGATIVE   Ketones, ur NEGATIVE  NEGATIVE mg/dL   Protein, ur NEGATIVE  NEGATIVE mg/dL   Urobilinogen, UA 0.2  0.0 - 1.0 mg/dL   Nitrite NEGATIVE  NEGATIVE   Leukocytes, UA NEGATIVE  NEGATIVE   Comment: MICROSCOPIC NOT DONE ON URINES WITH NEGATIVE PROTEIN, BLOOD, LEUKOCYTES, NITRITE, OR GLUCOSE <1000 mg/dL.   Labs are reviewed .  Current Facility-Administered Medications  Medication Dose Route Frequency Provider Last Rate Last Dose  . 0.9 %  sodium chloride infusion   Intravenous Continuous Donne Hazel, MD 100 mL/hr at 08/08/14 0200    . aspirin chewable tablet 81 mg  81 mg Oral Daily Donne Hazel, MD      . budesonide-formoterol [facility]) 160-4.5 MCG/ACT inhaler 2 puff  2 puff Inhalation BID Donne Hazel, MD   2 puff at 08/08/14 0730  . heparin injection 5,000 Units  5,000 Units Subcutaneous 3 times per day Donne Hazel, MD   5,000 Units at  08/08/14 0540  . hydroxyurea (HYDREA) capsule 500 mg  500 mg Oral Daily Donne Hazel, MD      . isosorbide mononitrate (IMDUR) 24 hr tablet 30 mg  30 mg Oral Daily Donne Hazel, MD      . metoprolol succinate (TOPROL-XL) 24 hr tablet 50 mg  50 mg Oral Daily Donne Hazel, MD      . sertraline (ZOLOFT) tablet 50 mg  50 mg Oral Daily Donne Hazel, MD   50 mg at 08/07/14 1335  . sodium chloride 0.9 % injection 3 mL  3 mL Intravenous Q12H Donne Hazel, MD   3 mL at 08/07/14 2144    Psychiatric Specialty Exam: Physical Exam as per history and physical  Review of Systems  Constitutional: Positive for weight loss and malaise/fatigue.  Eyes: Positive for blurred vision.  Gastrointestinal: Positive for constipation.  Musculoskeletal: Positive for myalgias.  Neurological: Positive for dizziness and weakness.  Psychiatric/Behavioral: Positive for depression and suicidal ideas. The  patient has insomnia.     Blood pressure 118/46, pulse 63, temperature 98.8 F (37.1 C), temperature source Oral, resp. rate 18, height '5\' 7"'  (1.702 m), SpO2 97.00%.There Ali no weight on file to calculate BMI.  General Appearance: Disheveled  Eye Sport and exercise psychologist::  Fair  Speech:  Clear and Coherent and Slow  Volume:  Decreased  Mood:  Depressed and Worthless  Affect:  Constricted and Depressed  Thought Process:  Coherent and Goal Directed  Orientation:  Full (Time, Place, and Person)  Thought Content:  Rumination  Suicidal Thoughts:  Yes.  without intent/plan  Homicidal Thoughts:  No  Memory:  Immediate;   Good Recent;   Good  Judgement:  Fair  Insight:  Good  Psychomotor Activity:  Decreased  Concentration:  Fair  Recall:  Good  Fund of Knowledge:Good  Language: Good  Akathisia:  NA  Handed:  Right  AIMS (if indicated):     Assets:  Communication Skills Desire for Improvement Financial Resources/Insurance Housing Leisure Time [place] Talents/Skills  Sleep:      Musculoskeletal: Strength & Muscle Tone: decreased Gait & Station: unable to stand Patient leans: N/A  Treatment Plan Summary: Daily contact with patient to assess and evaluate symptoms and progress in treatment Medication management Discontinue Sertraline - not helpful Check TSH and free T3 Start Cymbalta 30 mg Qam for depression  Start Remeron 15 mg PO Qhs for insomnia and appetite   Tequita Marrs,JANARDHAHA R. 08/08/2014 9:35 AM
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
## 2014-08-08 NOTE — Progress Notes (Signed)
INITIAL NUTRITION ASSESSMENT  DOCUMENTATION CODES Per approved criteria  -Severe malnutrition in the context of chronic illness   INTERVENTION: Ensure Complete po BID, each supplement provides 350 kcal and 13 grams of protein  NUTRITION DIAGNOSIS: Malnutritino related to chronic illness as evidenced by 7% weight loss x 1 month and severe muscle depletion.   Goal: Pt to meet >/= 90% of their estimated nutrition needs   Monitor:  PO intake, supplement acceptance, weight trends, labs  Reason for Assessment: Pt identified as at nutrition risk on the Malnutrition Screen Tool  80 y.o. male  Admitting Dx: Syncope  ASSESSMENT: Pt who lives alone and has hx of ETOH and depression admitted after syncopal episode. Syncopal episode likely from dehydration as pt has been eating very poorly at home.   Pt reports that he does not have much of an appetite. He does not cook often. Pt can drive and does his own shopping. Actual intake is a little unclear. Pt states that he goes to a neighbors house on Monday for a big dinner and takes leftovers for the next day. Otherwise it appears that he lives on cereal and frozen meals.   Nutrition Focused Physical Exam:  Subcutaneous Fat:  Orbital Region: WDL Upper Arm Region: WDL Thoracic and Lumbar Region: WDL  Muscle:  Temple Region: severe depletion  Clavicle Bone Region: mild/moderate depletion  Clavicle and Acromion Bone Region: mild/moderate depletion  Scapular Bone Region: severe depletion  Dorsal Hand: mild/moderate depletion  Patellar Region: severe depletion  Anterior Thigh Region: severe depletion  Posterior Calf Region: mild/moderate depletion   Edema: not present   Height: Ht Readings from Last 1 Encounters:  08/07/14 5\' 7"  (1.702 m)    Weight: Wt Readings from Last 1 Encounters:  08/06/14 167 lb 12.8 oz (76.114 kg)    Ideal Body Weight: 67.2 kg   % Ideal Body Weight: 113%  Wt Readings from Last 10 Encounters:   08/06/14 167 lb 12.8 oz (76.114 kg)  07/29/14 167 lb 6 oz (75.921 kg)  07/17/14 172 lb (78.019 kg)  06/23/14 179 lb 11.2 oz (81.511 kg)  06/09/14 176 lb 12.8 oz (80.196 kg)  05/26/14 174 lb 3.2 oz (79.017 kg)  05/16/14 178 lb (80.74 kg)  05/16/14 177 lb 1.6 oz (80.332 kg)  05/07/14 173 lb 9.6 oz (78.744 kg)  04/30/14 176 lb (79.833 kg)    Usual Body Weight: 179 lb   % Usual Body Weight: 93%  BMI:  26.2  Estimated Nutritional Needs: Kcal: 1650-1850 Protein: 85-95 grams Fluid: > 1.7 L/day  Skin: WDL  Diet Order: General Meal Completion: 50%   EDUCATION NEEDS: -No education needs identified at this time   Intake/Output Summary (Last 24 hours) at 08/08/14 1131 Last data filed at 08/08/14 0700  Gross per 24 hour  Intake   1320 ml  Output    500 ml  Net    820 ml    Last BM: 10/26   Labs:   Recent Labs Lab 08/07/14 1114  NA 137  K 4.5  CL 103  CO2 23  BUN 25*  CREATININE 1.16  CALCIUM 8.7  GLUCOSE 112*    CBG (last 3)  No results found for this basename: GLUCAP,  in the last 72 hours  Scheduled Meds: . aspirin  81 mg Oral Daily  . budesonide-formoterol  2 puff Inhalation BID  . DULoxetine  30 mg Oral Daily  . heparin  5,000 Units Subcutaneous 3 times per day  . hydroxyurea  500 mg Oral Daily  . isosorbide mononitrate  30 mg Oral Daily  . metoprolol succinate  50 mg Oral Daily  . mirtazapine  15 mg Oral QHS  . sodium chloride  3 mL Intravenous Q12H    Continuous Infusions: . sodium chloride 100 mL/hr at 08/08/14 0200    Past Medical History  Diagnosis Date  . Loss of height   . Compression fracture   . Hypertension   . Hypercholesterolemia   . Rhinitis   . Dyspnea   . Weight loss   . CAD (coronary artery disease)   . Peptic ulcer disease   . Alcohol abuse   . AMI (acute mesenteric ischemia)   . Colon polyps   . Pleurisy with effusion   . Grief reaction   . Macular degeneration   . Leukocytosis, unspecified 05/07/2014  .  Bilateral leg edema 05/07/2014  . Recurrent falls 05/07/2014    Past Surgical History  Procedure Laterality Date  . Exploratory laparotomy  1978    due to kidney problems  . Thoracotomy  1982    hemothorax  . Back surgery  2003    Cervical Spondylosis  . Spine surgery  2003    C-spine  . Cardiac catheterization  06/21/03  . Coronary stent placement  07/2003    x3  . Coronary angioplasty with stent placement  05/2004  . Colonoscopy w/ polypectomy  11/2006    [place], [place], [place] Pager [phone] After Hours Pager

## 2014-08-09 DIAGNOSIS — R55 Syncope and collapse: Secondary | ICD-10-CM

## 2014-08-09 LAB — T3, FREE: T3, Free: 2.6 pg/mL (ref 2.3–4.2)

## 2014-08-09 LAB — IRON AND TIBC
Iron: 79 ug/dL (ref 42–135)
SATURATION RATIOS: 42 % (ref 20–55)
TIBC: 186 ug/dL — ABNORMAL LOW (ref 215–435)
UIBC: 107 ug/dL — ABNORMAL LOW (ref 125–400)

## 2014-08-09 LAB — VITAMIN B12: Vitamin B-12: 363 pg/mL (ref 211–911)

## 2014-08-09 LAB — TSH: TSH: 1.08 u[IU]/mL (ref 0.350–4.500)

## 2014-08-09 LAB — FERRITIN: Ferritin: 186 ng/mL (ref 22–322)

## 2014-08-09 LAB — RETICULOCYTES
RBC.: 2.75 MIL/uL — ABNORMAL LOW (ref 4.22–5.81)
RETIC CT PCT: 1 % (ref 0.4–3.1)
Retic Count, Absolute: 27.5 10*3/uL (ref 19.0–186.0)

## 2014-08-09 LAB — FOLATE: FOLATE: 15.9 ng/mL

## 2014-08-09 NOTE — Progress Notes (Signed)
  Echocardiogram 2D Echocardiogram has been performed.  Daniel Ali FRANCES 08/09/2014, 11:25 AM

## 2014-08-09 NOTE — Progress Notes (Signed)
UR completed 

## 2014-08-09 NOTE — Progress Notes (Signed)
[facility] PROGRESS NOTE  Daniel Ali PHX:[id number] DOB: 01-26-1925 DOA: 08/07/2014 PCP: Unice Cobble, MD  Assessment/Plan:     Syncope likely from dehydration, malnutrition. Echo pending. Orthostatics negative.    Coronary atherosclerosis   Constipation   Recurrent falls: PT/OT recommending 24 hour supervision/ALF. Closest family member in [place]. Will consult SW to look into ALF. If unable to afford, or unwilling, will have to go home with home health. Check b12, folate   Myeloproliferative disease   Depression likely leading to weight loss, poor intake. meds adjusted by psychiatry Anemia: check anemia panel.  Code Status:  full Family Communication:   Disposition Plan:  ALF vs home with home health  Consultants:  psychiatry  Procedures:     Antibiotics:    HPI/Subjective: Feels ok with going home No SOB, no CP  Objective: Filed Vitals:   08/09/14 0814  BP:   Pulse: 72  Temp:   Resp: 18    Intake/Output Summary (Last 24 hours) at 08/09/14 0931 Last data filed at 08/08/14 1700  Gross per 24 hour  Intake    360 ml  Output    700 ml  Net   -340 ml   There were no vitals filed for this visit.  Exam:   General:   A and o.   Cardiovascular: RRR without MGR  Respiratory: CTA without WRR  Abdomen: S, NT, ND  Ext: no CCE  Basic Metabolic Panel:  Recent Labs Lab 08/07/14 1114  NA 137  K 4.5  CL 103  CO2 23  GLUCOSE 112*  BUN 25*  CREATININE 1.16  CALCIUM 8.7   Liver Function Tests: No results found for this basename: AST, ALT, ALKPHOS, BILITOT, PROT, ALBUMIN,  in the last 168 hours No results found for this basename: LIPASE, AMYLASE,  in the last 168 hours No results found for this basename: AMMONIA,  in the last 168 hours CBC:  Recent Labs Lab 08/07/14 1114  WBC 3.7*  NEUTROABS 2.4  HGB 9.7*  HCT 28.2*  MCV 94.0  PLT 208   Cardiac Enzymes:  Recent Labs Lab 08/07/14 1139  TROPONINI <0.30   BNP  (last 3 results) No results found for this basename: PROBNP,  in the last 8760 hours CBG: No results found for this basename: GLUCAP,  in the last 168 hours  No results found for this or any previous visit (from the past 240 hour(s)).   Studies: Dg Chest 2 View  08/07/2014   CLINICAL DATA:  80 year old male with syncopal episode. High blood pressure. Bilateral leg edema. Past smoker. Initial encounter.  EXAM: CHEST  2 VIEW  COMPARISON:  04/30/2014 chest x-ray.  04/24/2014 chest seed.  FINDINGS: Pleural thickening and remote rib fractures unchanged. Blunting right costophrenic angle suggestive of scarring minimally changed from prior exams.  No infiltrate, congestive heart failure or pneumothorax.  Calcified mildly tortuous aorta.  No focal thoracic compression deformity.  Heart size within normal limits.  IMPRESSION: No acute abnormality.  Please see above.   Electronically Signed   By: Chauncey Cruel M.D.   On: 08/07/2014 12:11   Ct Head Wo Contrast  08/07/2014   CLINICAL DATA:  Syncope.  Fall.  Initial evaluation.  EXAM: CT HEAD WITHOUT CONTRAST  CT CERVICAL SPINE WITHOUT CONTRAST  TECHNIQUE: Multidetector CT imaging of the head and cervical spine was performed following the standard protocol without intravenous contrast. Multiplanar CT image reconstructions of the cervical spine were also generated.  COMPARISON:  05/05/2010  FINDINGS: CT HEAD FINDINGS  No intra-axial or extra-axial pathologic fluid or blood collection. No mass. Diffuse cerebral atrophy. Mild ventriculomegaly consistent with degree of atrophy noted. These findings are stable. No acute bony abnormality identified.  CT CERVICAL SPINE FINDINGS  Diffuse severe degenerative change present. Multilevel degenerative changes. No evidence of fracture or dislocation. Pulmonary apices are clear.  IMPRESSION: 1. No acute intracranial abnormality. 2. Diffuse cervical spine degenerative change.   Electronically Signed   By: Marcello Moores  Register   On:  08/07/2014 12:50   Ct Cervical Spine Wo Contrast  08/07/2014   CLINICAL DATA:  Syncope.  Fall.  Initial evaluation.  EXAM: CT HEAD WITHOUT CONTRAST  CT CERVICAL SPINE WITHOUT CONTRAST  TECHNIQUE: Multidetector CT imaging of the head and cervical spine was performed following the standard protocol without intravenous contrast. Multiplanar CT image reconstructions of the cervical spine were also generated.  COMPARISON:  05/05/2010  FINDINGS: CT HEAD FINDINGS  No intra-axial or extra-axial pathologic fluid or blood collection. No mass. Diffuse cerebral atrophy. Mild ventriculomegaly consistent with degree of atrophy noted. These findings are stable. No acute bony abnormality identified.  CT CERVICAL SPINE FINDINGS  Diffuse severe degenerative change present. Multilevel degenerative changes. No evidence of fracture or dislocation. Pulmonary apices are clear.  IMPRESSION: 1. No acute intracranial abnormality. 2. Diffuse cervical spine degenerative change.   Electronically Signed   By: Marcello Moores  Register   On: 08/07/2014 12:50    Scheduled Meds: . aspirin  81 mg Oral Daily  . budesonide-formoterol  2 puff Inhalation BID  . DULoxetine  30 mg Oral Daily  . feeding supplement (ENSURE COMPLETE)  237 mL Oral BID BM  . heparin  5,000 Units Subcutaneous 3 times per day  . hydroxyurea  500 mg Oral Daily  . isosorbide mononitrate  30 mg Oral Daily  . metoprolol succinate  50 mg Oral Daily  . mirtazapine  15 mg Oral QHS  . sodium chloride  3 mL Intravenous Q12H   Continuous Infusions:    Time spent: 25 minutes  Carmyn Hamm  [facility] Pager [phone]. If 7PM-7AM, please contact night-coverage at [email or web address], password [facility] 08/09/2014, 9:31 AM  LOS: 2 days

## 2014-08-10 DIAGNOSIS — E43 Unspecified severe protein-calorie malnutrition: Secondary | ICD-10-CM

## 2014-08-10 LAB — BASIC METABOLIC PANEL
Anion gap: 8 (ref 5–15)
BUN: 20 mg/dL (ref 6–23)
CALCIUM: 8.8 mg/dL (ref 8.4–10.5)
CO2: 26 meq/L (ref 19–32)
CREATININE: 1.1 mg/dL (ref 0.50–1.35)
Chloride: 104 mEq/L (ref 96–112)
GFR calc Af Amer: 68 mL/min — ABNORMAL LOW (ref >90)
GFR calc non Af Amer: 58 mL/min — ABNORMAL LOW (ref >90)
GLUCOSE: 99 mg/dL (ref 70–99)
Potassium: 4.4 mEq/L (ref 3.7–5.3)
Sodium: 138 mEq/L (ref 137–147)

## 2014-08-10 LAB — CBC
HEMATOCRIT: 28.9 % — AB (ref 39.0–52.0)
HEMOGLOBIN: 9.6 g/dL — AB (ref 13.0–17.0)
MCH: 31.5 pg (ref 26.0–34.0)
MCHC: 33.2 g/dL (ref 30.0–36.0)
MCV: 94.8 fL (ref 78.0–100.0)
Platelets: 238 10*3/uL (ref 150–400)
RBC: 3.05 MIL/uL — AB (ref 4.22–5.81)
RDW: 28.5 % — ABNORMAL HIGH (ref 11.5–15.5)
WBC: 3.9 10*3/uL — ABNORMAL LOW (ref 4.0–10.5)

## 2014-08-10 MED ORDER — METOPROLOL SUCCINATE ER 25 MG PO TB24
25.0000 mg | ORAL_TABLET | Freq: Every day | ORAL | Status: DC
  Administered 2014-08-11: 25 mg via ORAL
  Filled 2014-08-10: qty 1

## 2014-08-10 NOTE — Progress Notes (Signed)
Utilization review completed.  

## 2014-08-10 NOTE — Progress Notes (Signed)
CSW Intern discussed plan for d/c. Pt stated he and his family feel it would be best to go to an [place] (ALF) within [facility]. CSW Intern provided pt with ALF list. Pt did not have any preferences at the time. He stated he has volunteered and has friends at some ALFs, but cannot remember the specific facilities at this moment. CSW Intern asked if pt would be private pay and informed of possible rates and pt stated he would be paying privately. CSW Intern to begin bed search. Unit CSW will f/u.    Cosigned by Creta Levin csw

## 2014-08-10 NOTE — Progress Notes (Signed)
    [facility] PROGRESS NOTE  Daniel Ali UKG:[id number] DOB: 1925-01-19 DOA: 08/07/2014 PCP: Unice Cobble, MD  Assessment/Plan:     Syncope likely from dehydration, malnutrition. Echo ok. Orthostatics negative.    Coronary atherosclerosis   Constipation   Recurrent falls: PT/OT recommending 24 hour supervision/ALF. Closest family member in [place]. awaiting SW to look into ALF. If unable to afford, or unwilling, will have to go home with home health.    Myeloproliferative disease   Depression likely leading to weight loss, poor intake. meds adjusted by psychiatry Anemia: check anemia panel.  Code Status:  full Family Communication:  Patient/son on phone Disposition Plan:  ALF vs home with home health vs SNF  Consultants:  psychiatry  Procedures:     Antibiotics:    HPI/Subjective: No overnight events Good eye contact- thinking about assisted living  Objective: Filed Vitals:   08/10/14 0532  BP: 109/58  Pulse: 55  Temp: 98.1 F (36.7 C)  Resp: 16    Intake/Output Summary (Last 24 hours) at 08/10/14 1054 Last data filed at 08/09/14 2135  Gross per 24 hour  Intake      0 ml  Output    200 ml  Net   -200 ml   There were no vitals filed for this visit.  Exam:   General:   Alert, NAD.   Cardiovascular: RRR without MGR  Respiratory: CTA without WRR  Abdomen: S, NT, ND  Ext: no CCE  Basic Metabolic Panel:  Recent Labs Lab 08/07/14 1114 08/10/14 0515  NA 137 138  K 4.5 4.4  CL 103 104  CO2 23 26  GLUCOSE 112* 99  BUN 25* 20  CREATININE 1.16 1.10  CALCIUM 8.7 8.8   Liver Function Tests: No results for input(s): AST, ALT, ALKPHOS, BILITOT, PROT, ALBUMIN in the last 168 hours. No results for input(s): LIPASE, AMYLASE in the last 168 hours. No results for input(s): AMMONIA in the last 168 hours. CBC:  Recent Labs Lab 08/07/14 1114 08/10/14 0515  WBC 3.7* 3.9*  NEUTROABS 2.4  --   HGB 9.7* 9.6*  HCT 28.2* 28.9*   MCV 94.0 94.8  PLT 208 238   Cardiac Enzymes:  Recent Labs Lab 08/07/14 1139  TROPONINI <0.30   BNP (last 3 results) No results for input(s): PROBNP in the last 8760 hours. CBG: No results for input(s): GLUCAP in the last 168 hours.  No results found for this or any previous visit (from the past 240 hour(s)).   Studies: No results found.  Scheduled Meds: . aspirin  81 mg Oral Daily  . budesonide-formoterol  2 puff Inhalation BID  . DULoxetine  30 mg Oral Daily  . feeding supplement (ENSURE COMPLETE)  237 mL Oral BID BM  . heparin  5,000 Units Subcutaneous 3 times per day  . hydroxyurea  500 mg Oral Daily  . isosorbide mononitrate  30 mg Oral Daily  . [START ON 08/11/2014] metoprolol succinate  25 mg Oral Daily  . mirtazapine  15 mg Oral QHS  . sodium chloride  3 mL Intravenous Q12H   Continuous Infusions:    Time spent: 25 minutes  VANN, JESSICA  [facility] Pager [phone]. If 7PM-7AM, please contact night-coverage at [email or web address], password [email or web address] 08/10/2014, 10:54 AM  LOS: 3 days

## 2014-08-10 NOTE — Progress Notes (Signed)
Pt's FL2 has been faxed out to ALFs and we await bed offers. Unit CSW to follow.

## 2014-08-11 DIAGNOSIS — F4321 Adjustment disorder with depressed mood: Secondary | ICD-10-CM

## 2014-08-11 DIAGNOSIS — K5909 Other constipation: Secondary | ICD-10-CM

## 2014-08-11 MED ORDER — ENSURE COMPLETE PO LIQD: 237.0000 mL | Freq: Two times a day (BID) | ORAL | Status: DC

## 2014-08-11 MED ORDER — METOPROLOL SUCCINATE ER 25 MG PO TB24: 25.0000 mg | ORAL_TABLET | Freq: Every day | ORAL | Status: DC

## 2014-08-11 MED ORDER — BISACODYL 10 MG RE SUPP
10.0000 mg | Freq: Once | RECTAL | Status: AC
  Administered 2014-08-11: 10 mg via RECTAL
  Filled 2014-08-11: qty 1

## 2014-08-11 MED ORDER — DULOXETINE HCL 40 MG PO CPEP: 40.0000 mg | ORAL_CAPSULE | Freq: Every day | ORAL | Status: DC

## 2014-08-11 MED ORDER — MIRTAZAPINE 15 MG PO TBDP: 15.0000 mg | ORAL_TABLET | Freq: Every day | ORAL | Status: DC

## 2014-08-11 MED ORDER — POLYETHYLENE GLYCOL 3350 17 G PO PACK
17.0000 g | PACK | Freq: Every day | ORAL | Status: DC
  Administered 2014-08-11: 17 g via ORAL
  Filled 2014-08-11: qty 1

## 2014-08-11 MED ORDER — DULOXETINE HCL 20 MG PO CPEP
40.0000 mg | ORAL_CAPSULE | Freq: Every day | ORAL | Status: DC
Start: 2014-08-11 — End: 2014-08-11
  Administered 2014-08-11: 40 mg via ORAL
  Filled 2014-08-11 (×2): qty 2

## 2014-08-11 NOTE — Consult Note (Signed)
Psychiatry Consult Follow up Note  Reason for Consult:  depression, poor oral intake and present with syncopal episode. Referring Physician:  Dr. Wyline Copas / [name] is an 80 y.o. male.  Total Time spent with patient: 20 minutes  Assessment: AXIS I:  Major Depression, Recurrent severe and grief AXIS II:  Deferred AXIS III:   Past Medical History  Diagnosis Date  . Loss of height   . Compression fracture   . Hypertension   . Hypercholesterolemia   . Rhinitis   . Dyspnea   . Weight loss   . CAD (coronary artery disease)   . Peptic ulcer disease   . Alcohol abuse   . AMI (acute mesenteric ischemia)   . Colon polyps   . Pleurisy with effusion   . Grief reaction   . Macular degeneration   . Leukocytosis, unspecified 05/07/2014  . Bilateral leg edema 05/07/2014  . Recurrent falls 05/07/2014   AXIS IV:  other psychosocial or environmental problems, problems related to social environment and problems with primary support group AXIS V:  41-50 serious symptoms  Plan: Recommend ALF when medically stable and working with social work regarding the placement  Increase Cymbalta 40 mg Qam for depression  Continue Remeron 15 mg PO Qhs for insomnia and appetite No evidence of imminent risk to self or others at present.   Patient does not meet criteria for psychiatric inpatient admission. Supportive therapy provided about ongoing stressors. Discussed crisis plan, support from social network, calling 911, coming to the Emergency Department, and calling Suicide Hotline. Appreciate psychiatric consultation and follow up as needed Please contact [phone] if needs further assistance  Subjective:   Daniel Ali is a 80 y.o. male patient admitted with depression, dehydration with syncopal episode.  HPI:  Daniel Ali is a 80 y.o. male seen for psych consultation and evaluation for depression over one year. Patient reported that he has been depressed, loss of interest,  loss of motivation, not actively participating in his home or garden work, less socialization, more isolation, disturbed sleep and lost appetite and feels lost about one hundred pounds in the last one year. He has been receiving zoloft from primary care and has no psych providers. He has home health care has been coming home three times a week to fix his medications and reportedly been complaint with medications. He has been drinking since age 38 and last use was at age 65. He was retired Chief Financial Officer. He has family out of state and been in regular communication and has few friends locally. He is calm and cooperative. He has intact cognition except mild deficits with memory. He is currently feeling quite depressed about the recent loss of his neighbor for a cancer less than a year, significant other for cancer more than a year ago and son from cancer about six to ten years ago. He has suicidal ideations but denied intention or plans.He has no homicidal ideation, intention or plans and has no evidence of psychosis.  Interval history: Patient appeared lying on his bed, awake, alert and oriented x 4. Patient stated that he continues to be ruminating about the death/loss of his family and friends. He has been compliant with medications and feels the medication is helping him, slept good last night, able to tolerate oral intake and reportedly has constipation. He has been in contact with his son in [place] during this weekend. Patient reports that he has been working with PT/OT and feels better when able to  walk with cane. He has denied suicidal or homicidal ideation and no evidence of psychosis. He has agree to increase his antidepressant medication for better control of his depression. He has been working with social work regarding ALF when medically stable.   Past Psychiatric History: Past Medical History  Diagnosis Date  . Loss of height   . Compression fracture   . Hypertension   . Hypercholesterolemia   .  Rhinitis   . Dyspnea   . Weight loss   . CAD (coronary artery disease)   . Peptic ulcer disease   . Alcohol abuse   . AMI (acute mesenteric ischemia)   . Colon polyps   . Pleurisy with effusion   . Grief reaction   . Macular degeneration   . Leukocytosis, unspecified 05/07/2014  . Bilateral leg edema 05/07/2014  . Recurrent falls 05/07/2014    reports that he quit smoking about 67 years ago. He has never used smokeless tobacco. He reports that he does not drink alcohol or use illicit drugs. Family History  Problem Relation Age of Onset  . Diabetes Father   . Colon cancer Mother   . Colon cancer Son      Living Arrangements: Alone   Abuse/Neglect [facility]) Physical Abuse: Denies Verbal Abuse: Denies Sexual Abuse: Denies Allergies:   Allergies  Allergen Reactions  . Cefprozil     REACTION: insomnia  . Simvastatin     REACTION: MUSCLE ACHES    ACT Assessment Complete:  NO  Objective: Blood pressure 107/78, pulse 58, temperature 98 F (36.7 C), temperature source Oral, resp. rate 16, height _0  (1.702 m), SpO2 96 %.There is no weight on file to calculate BMI. Results for orders placed or performed during the hospital encounter of 08/07/14 (from the past 72 hour(s))  TSH     Status: None   Collection Time: 08/09/14  4:53 AM  Result Value Ref Range   TSH 1.080 0.350 - 4.500 uIU/mL  T3, free     Status: None   Collection Time: 08/09/14  4:53 AM  Result Value Ref Range   T3, Free 2.6 2.3 - 4.2 pg/mL    Comment: Performed at [facility]  Vitamin B12     Status: None   Collection Time: 08/09/14  4:53 AM  Result Value Ref Range   Vitamin B-12 363 211 - 911 pg/mL    Comment: Performed at [facility]  Folate     Status: None   Collection Time: 08/09/14  4:53 AM  Result Value Ref Range   Folate 15.9 ng/mL    Comment: (NOTE) Reference Ranges        Deficient:       0.4 - 3.3 ng/mL        Indeterminate:   3.4 - 5.4 ng/mL        Normal:              > 5.4  ng/mL Performed at [facility]  Iron and TIBC     Status: Abnormal   Collection Time: 08/09/14  4:53 AM  Result Value Ref Range   Iron 79 42 - 135 ug/dL   TIBC 186 (L) 215 - 435 ug/dL   Saturation Ratios 42 20 - 55 %   UIBC 107 (L) 125 - 400 ug/dL    Comment: Performed at [facility]  Ferritin     Status: None   Collection Time: 08/09/14  4:53 AM  Result Value Ref Range  Ferritin 186 22 - 322 ng/mL    Comment: Performed at [place]     Status: Abnormal   Collection Time: 08/09/14  4:53 AM  Result Value Ref Range   Retic Ct Pct 1.0 0.4 - 3.1 %   RBC. 2.75 (L) 4.22 - 5.81 MIL/uL   Retic Count, Manual 27.5 19.0 - 186.0 K/uL  CBC     Status: Abnormal   Collection Time: 08/10/14  5:15 AM  Result Value Ref Range   WBC 3.9 (L) 4.0 - 10.5 K/uL   RBC 3.05 (L) 4.22 - 5.81 MIL/uL   Hemoglobin 9.6 (L) 13.0 - 17.0 g/dL   HCT 28.9 (L) 39.0 - 52.0 %   MCV 94.8 78.0 - 100.0 fL   MCH 31.5 26.0 - 34.0 pg   MCHC 33.2 30.0 - 36.0 g/dL   RDW 28.5 (H) 11.5 - 15.5 %   Platelets 238 150 - 400 K/uL  Basic metabolic panel     Status: Abnormal   Collection Time: 08/10/14  5:15 AM  Result Value Ref Range   Sodium 138 137 - 147 mEq/L   Potassium 4.4 3.7 - 5.3 mEq/L   Chloride 104 96 - 112 mEq/L   CO2 26 19 - 32 mEq/L   Glucose, Bld 99 70 - 99 mg/dL   BUN 20 6 - 23 mg/dL   Creatinine, Ser 1.10 0.50 - 1.35 mg/dL   Calcium 8.8 8.4 - 10.5 mg/dL   GFR calc non Af Amer 58 (L) >90 mL/min   GFR calc Af Amer 68 (L) >90 mL/min    Comment: (NOTE) The eGFR has been calculated using the CKD EPI equation. This calculation has not been validated in all clinical situations. eGFR's persistently <90 mL/min signify possible Chronic Kidney Disease.    Anion gap 8 5 - 15   Labs are reviewed .  Current Facility-Administered Medications  Medication Dose Route Frequency Provider Last Rate Last Dose  . aspirin chewable tablet 81 mg  81 mg Oral Daily Donne Hazel,  MD   81 mg at 08/10/14 1014  . budesonide-formoterol (SYMBICORT) 160-4.5 MCG/ACT inhaler 2 puff  2 puff Inhalation BID Donne Hazel, MD   2 puff at 08/10/14 2119  . DULoxetine (CYMBALTA) DR capsule 30 mg  30 mg Oral Daily Durward Parcel, MD   30 mg at 08/10/14 1014  . feeding supplement (ENSURE COMPLETE) (ENSURE COMPLETE) liquid 237 mL  237 mL Oral BID BM Heather Cornelison Pitts, RD   237 mL at 08/10/14 1659  . heparin injection 5,000 Units  5,000 Units Subcutaneous 3 times per day Donne Hazel, MD   5,000 Units at 08/11/14 0553  . hydroxyurea (HYDREA) capsule 500 mg  500 mg Oral Daily Donne Hazel, MD   500 mg at 08/10/14 1014  . isosorbide mononitrate (IMDUR) 24 hr tablet 30 mg  30 mg Oral Daily Donne Hazel, MD   30 mg at 08/10/14 1014  . metoprolol succinate (TOPROL-XL) 24 hr tablet 25 mg  25 mg Oral Daily Jessica U Vann, DO      . mirtazapine (REMERON SOL-TAB) disintegrating tablet 15 mg  15 mg Oral QHS Durward Parcel, MD   15 mg at 08/10/14 2139  . sodium chloride 0.9 % injection 3 mL  3 mL Intravenous Q12H Donne Hazel, MD   3 mL at 08/10/14 2139    Psychiatric Specialty Exam: Physical Exam as per history and physical  Review of  Systems  Constitutional: Positive for weight loss and malaise/fatigue.  Eyes: Positive for blurred vision.  Gastrointestinal: Positive for constipation.  Musculoskeletal: Positive for myalgias.  Neurological: Positive for dizziness and weakness.  Psychiatric/Behavioral: Positive for depression and suicidal ideas. The patient has insomnia.     Blood pressure 107/78, pulse 58, temperature 98 F (36.7 C), temperature source Oral, resp. rate 16, height _0  (1.702 m), SpO2 96 %.There is no weight on file to calculate BMI.  General Appearance: Disheveled  Eye Sport and exercise psychologist::  Fair  Speech:  Clear and Coherent and Slow  Volume:  Decreased  Mood:  Depressed and Worthless  Affect:  Constricted and Depressed  Thought Process:  Coherent  and Goal Directed  Orientation:  Full (Time, Place, and Person)  Thought Content:  Rumination  Suicidal Thoughts:  Yes.  without intent/plan  Homicidal Thoughts:  No  Memory:  Immediate;   Good Recent;   Good  Judgement:  Fair  Insight:  Good  Psychomotor Activity:  Decreased  Concentration:  Fair  Recall:  Good  Fund of Knowledge:Good  Language: Good  Akathisia:  NA  Handed:  Right  AIMS (if indicated):     Assets:  Communication Skills Desire for Improvement Financial Resources/Insurance Housing Leisure Time [place] Talents/Skills  Sleep:      Musculoskeletal: Strength & Muscle Tone: decreased Gait & Station: unable to stand Patient leans: N/A  Treatment Plan Summary: Daily contact with patient to assess and evaluate symptoms and progress in treatment Medication management Checked TSH and free T3 - within normal limits Increase Cymbalta 40 mg Qam for depression  Continue Remeron 15 mg PO Qhs for insomnia and appetite   Tarnesha Ulloa,JANARDHAHA R. 08/11/2014 8:35 AM

## 2014-08-11 NOTE — Clinical Social Work Placement (Signed)
Clinical Social Work Department CLINICAL SOCIAL WORK PLACEMENT NOTE 08/11/2014  Patient:  Daniel Ali, Daniel Ali  Account Number:  [id number] Admit date:  08/07/2014  Clinical Social Worker:  Marry Kusch, LCSWA  Date/time:  08/11/2014 05:30 PM  Clinical Social Work is seeking post-discharge placement for this patient at the following level of care:   SKILLED NURSING   (*CSW will update this form in Epic as items are completed)   08/11/2014  Patient/family provided with [facility] Department of Clinical Social Work's list of facilities offering this level of care within the geographic area requested by the patient (or if unable, by the patient's family).  08/11/2014  Patient/family informed of their freedom to choose among providers that offer the needed level of care, that participate in Medicare, Medicaid or managed care program needed by the patient, have an available bed and are willing to accept the patient.  08/11/2014  Patient/family informed of [facility]' ownership interest in [facility], as well as of the fact that they are under no obligation to receive care at this facility.  PASARR submitted to EDS on 08/11/2014 PASARR number received on 08/11/2014  FL2 transmitted to all facilities in geographic area requested by pt/family on  08/11/2014 FL2 transmitted to all facilities within larger geographic area on 08/11/2014  Patient informed that his/her managed care company has contracts with or will negotiate with  certain facilities, including the following:     Patient/family informed of bed offers received:  08/11/2014 Patient chooses bed at [facility] Physician recommends and patient chooses bed at    Patient to be transferred to [facility] on  08/11/2014 Patient to be transferred to facility by [facility] Patient and family notified of transfer on 08/11/2014 Name of family member notified:  Donivan Scull  The  following physician request were entered in Epic:   Additional Comments:

## 2014-08-11 NOTE — Progress Notes (Signed)
PT Cancellation Note  Patient Details Name: Daniel Ali MRN: [id number] DOB: 04/06/1925   Cancelled Treatment:    Reason Eval/Treat Not Completed: Other (comment). Attempted to see pt at 1335 but just starting lunch and asked if we could defer. Attempted again to see pt this PM at 1415,  but pt having loose stools per report and did not want to get up or exercise. Will check back as able and continue PT POC.   [name] [facility] 08/11/2014, 2:13 PM

## 2014-08-11 NOTE — Progress Notes (Signed)
08/11/2014 5:51 PM D/c iv line. D/c tele. D/c [facility] via EMS transport.  Kalief Kattner, Arville Lime

## 2014-08-11 NOTE — Discharge Summary (Signed)
Physician Discharge Summary  [name] XUX:[id number] DOB: 02-Jun-1925 DOA: 08/07/2014  PCP: Unice Cobble, MD  Admit date: 08/07/2014 Discharge date: 08/11/2014  Time spent: 35 minutes  Recommendations for Outpatient Follow-up:  1. Titrate depression meds as tolerated 2. To SNF  Discharge Diagnoses:  Principal Problem:   Syncope Active Problems:   Coronary atherosclerosis   Constipation   Recurrent falls   Myeloproliferative disease   Depression   Protein-calorie malnutrition, severe   Discharge Condition: improved  Diet recommendation: cardiac  There were no vitals filed for this visit.  History of present illness:   Daniel Ali is a 80 y.o. male  With a hx of depression, htn, compression fx, prior ETOH abuse who presents with syncopal episode while walking on day of admission. Pt admits to not eating well recently and noticed markedly decreased urine output and dry mouth. Pt is currently feeling quite depressed about the recent loss of his neighbor, significant other, and son all from cancer within the past month. He denies suicidal ideations. In the ED, pt was noted to have a mildly elevated BUN. He was given aggressive IVF before orthostatics were obtained. Pt otherwise denies chest pains or sob. Hospitalist service was consulted for consideration for admission for syncope work up.  Hospital Course:  Syncope likely from dehydration, malnutrition. Echo ok. Orthostatics negative after fluid resecutation.  Coronary atherosclerosis  Constipation- suppository  Recurrent falls:SNF and then perhaps ALF. Closest family member in [place].   Myeloproliferative disease  Depression likely leading to weight loss, poor intake. meds adjusted by psychiatry   Procedures:  echo  Consultations:  Psych  PT/OT  Discharge Exam: Filed Vitals:   08/11/14 0503  BP: 107/78  Pulse: 58  Temp: 98 F (36.7 C)  Resp: 16    General: pleasant/cooeprative-  feels he is improving Cardiovascular: rrr Respiratory: clear  Discharge Instructions You were cared for by a hospitalist during your hospital stay. If you have any questions about your discharge medications or the care you received while you were in the hospital after you are discharged, you can call the unit and asked to speak with the hospitalist on call if the hospitalist that took care of you is not available. Once you are discharged, your primary care physician will handle any further medical issues. Please note that NO REFILLS for any discharge medications will be authorized once you are discharged, as it is imperative that you return to your primary care physician (or establish a relationship with a primary care physician if you do not have one) for your aftercare needs so that they can reassess your need for medications and monitor your lab values.   Current Discharge Medication List    CONTINUE these medications which have NOT CHANGED   Details  aspirin 81 MG tablet Take 81 mg by mouth daily.      budesonide-formoterol (SYMBICORT) 160-4.5 MCG/ACT inhaler Inhale 2 puffs into the lungs 2 (two) times daily.    furosemide (LASIX) 40 MG tablet Take 1 tablet (40 mg total) by mouth daily. Qty: 30 tablet, Refills: 3   Associated Diagnoses: Edema    hydroxyurea (HYDREA) 500 MG capsule Take 1 capsule (500 mg total) by mouth daily. May take with food to minimize GI side effects. Qty: 30 capsule, Refills: 3   Associated Diagnoses: Myeloproliferative disease    metoprolol succinate (TOPROL-XL) 50 MG 24 hr tablet take 1/4 tablet by mouth twice a day    Multiple Minerals-Vitamins (CALCIUM & VIT D3 BONE HEALTH  PO) Take by mouth daily.     Multiple Vitamins-Minerals (ANTIOXIDANT) TABS Take by mouth daily.      nitroGLYCERIN (NITROSTAT) 0.4 MG SL tablet Place 0.4 mg under the tongue every 5 (five) minutes as needed.      Omega-3 Fatty Acids (FISH OIL) 1000 MG CAPS Take by mouth daily.     sertraline (ZOLOFT) 50 MG tablet Take 1 tablet (50 mg total) by mouth daily. Qty: 30 tablet, Refills: 3   Associated Diagnoses: Depression    isosorbide mononitrate (IMDUR) 30 MG 24 hr tablet take 1 tablet by mouth once daily Qty: 30 tablet, Refills: 3       Allergies  Allergen Reactions  . Cefprozil     REACTION: insomnia  . Simvastatin     REACTION: MUSCLE ACHES      The results of significant diagnostics from this hospitalization (including imaging, microbiology, ancillary and laboratory) are listed below for reference.    Significant Diagnostic Studies: Dg Chest 2 View  08/07/2014   CLINICAL DATA:  80 year old male with syncopal episode. High blood pressure. Bilateral leg edema. Past smoker. Initial encounter.  EXAM: CHEST  2 VIEW  COMPARISON:  04/30/2014 chest x-ray.  04/24/2014 chest seed.  FINDINGS: Pleural thickening and remote rib fractures unchanged. Blunting right costophrenic angle suggestive of scarring minimally changed from prior exams.  No infiltrate, congestive heart failure or pneumothorax.  Calcified mildly tortuous aorta.  No focal thoracic compression deformity.  Heart size within normal limits.  IMPRESSION: No acute abnormality.  Please see above.   Electronically Signed   By: Chauncey Cruel M.D.   On: 08/07/2014 12:11   Ct Head Wo Contrast  08/07/2014   CLINICAL DATA:  Syncope.  Fall.  Initial evaluation.  EXAM: CT HEAD WITHOUT CONTRAST  CT CERVICAL SPINE WITHOUT CONTRAST  TECHNIQUE: Multidetector CT imaging of the head and cervical spine was performed following the standard protocol without intravenous contrast. Multiplanar CT image reconstructions of the cervical spine were also generated.  COMPARISON:  05/05/2010  FINDINGS: CT HEAD FINDINGS  No intra-axial or extra-axial pathologic fluid or blood collection. No mass. Diffuse cerebral atrophy. Mild ventriculomegaly consistent with degree of atrophy noted. These findings are stable. No acute bony abnormality  identified.  CT CERVICAL SPINE FINDINGS  Diffuse severe degenerative change present. Multilevel degenerative changes. No evidence of fracture or dislocation. Pulmonary apices are clear.  IMPRESSION: 1. No acute intracranial abnormality. 2. Diffuse cervical spine degenerative change.   Electronically Signed   By: Marcello Moores  Register   On: 08/07/2014 12:50   Ct Cervical Spine Wo Contrast  08/07/2014   CLINICAL DATA:  Syncope.  Fall.  Initial evaluation.  EXAM: CT HEAD WITHOUT CONTRAST  CT CERVICAL SPINE WITHOUT CONTRAST  TECHNIQUE: Multidetector CT imaging of the head and cervical spine was performed following the standard protocol without intravenous contrast. Multiplanar CT image reconstructions of the cervical spine were also generated.  COMPARISON:  05/05/2010  FINDINGS: CT HEAD FINDINGS  No intra-axial or extra-axial pathologic fluid or blood collection. No mass. Diffuse cerebral atrophy. Mild ventriculomegaly consistent with degree of atrophy noted. These findings are stable. No acute bony abnormality identified.  CT CERVICAL SPINE FINDINGS  Diffuse severe degenerative change present. Multilevel degenerative changes. No evidence of fracture or dislocation. Pulmonary apices are clear.  IMPRESSION: 1. No acute intracranial abnormality. 2. Diffuse cervical spine degenerative change.   Electronically Signed   By: Marcello Moores  Register   On: 08/07/2014 12:50    Microbiology: No results found for  this or any previous visit (from the past 240 hour(s)).   Labs: Basic Metabolic Panel:  Recent Labs Lab 08/07/14 1114 08/10/14 0515  NA 137 138  K 4.5 4.4  CL 103 104  CO2 23 26  GLUCOSE 112* 99  BUN 25* 20  CREATININE 1.16 1.10  CALCIUM 8.7 8.8   Liver Function Tests: No results for input(s): AST, ALT, ALKPHOS, BILITOT, PROT, ALBUMIN in the last 168 hours. No results for input(s): LIPASE, AMYLASE in the last 168 hours. No results for input(s): AMMONIA in the last 168 hours. CBC:  Recent Labs Lab  08/07/14 1114 08/10/14 0515  WBC 3.7* 3.9*  NEUTROABS 2.4  --   HGB 9.7* 9.6*  HCT 28.2* 28.9*  MCV 94.0 94.8  PLT 208 238   Cardiac Enzymes:  Recent Labs Lab 08/07/14 1139  TROPONINI <0.30   BNP: BNP (last 3 results) No results for input(s): PROBNP in the last 8760 hours. CBG: No results for input(s): GLUCAP in the last 168 hours.     SignedEulogio Bear  [facility] 08/11/2014, 10:11 AM

## 2014-08-11 NOTE — Progress Notes (Signed)
08/11/2014 4:26 PM Nursing note Report called to [facility]. Questions and concerns addressed.  Demetrious Rainford, Arville Lime

## 2014-08-11 NOTE — Clinical Social Work Psychosocial (Signed)
Clinical Social Work Department BRIEF PSYCHOSOCIAL ASSESSMENT 08/11/2014  Patient:  Daniel Ali, Daniel Ali     Account Number:  [id number]     Admit date:  08/07/2014  Clinical Social Worker:  Dian Queen  Date/Time:  08/11/2014 05:22 PM  Referred by:  Physician  Date Referred:  08/11/2014 Referred for  SNF Placement   Other Referral:   Interview type:  Patient Other interview type:    PSYCHOSOCIAL DATA Living Status:  ALONE Admitted from facility:   Level of care:   Primary support name:  Daniel Ali Primary support relationship to patient:  FRIEND Degree of support available:   Patient's friend is the primary support for patient.    CURRENT CONCERNS  Other Concerns:    SOCIAL WORK ASSESSMENT / PLAN Patient is a 80 year old male who was living by himself. Patient is alert and oriented x3.  Patient stated he would like to consider moving to a ALF once he has his strength back up.  Patient has a close friend who seems to be very helpful and looks after him.  Patient gave permission to talk to his friend Daniel Ali.  Daniel Ali states she would like help from the SNF social worker to help him find an ALF he can afford.  Patient plans to return home or consider going to a ALF once he has gained his strength.   Assessment/plan status:   Other assessment/ plan:   Information/referral to community resources:    PATIENT'S/FAMILY'S RESPONSE TO PLAN OF CARE: Patient and friend in agreement to go to a SNF.   Daniel Ali. [place], MSW, [place] 08/11/2014 5:28 PM

## 2014-08-11 NOTE — Clinical Social Work Note (Signed)
Patient to be d/c'ed today to [facility].  Patient and friend agreeable to plans will transport via ems RN to call report.  Evette Cristal, MSW, [place]

## 2014-08-11 NOTE — Progress Notes (Signed)
PT Cancellation Note  Patient Details Name: Daniel Ali MRN: [id number] DOB: 16-Aug-1925   Cancelled Treatment:    Reason Eval/Treat Not Completed: Other (comment) Pt had just received a suppository and waiting on the effects, so declined PT at this time. Offered to assist pt to toilet if needed, but pt stated he wasn't quite ready yet. Will check back later as able to address PT needs.   [name] [facility] 08/11/2014, 10:40 AM

## 2014-08-14 ENCOUNTER — Non-Acute Institutional Stay (SKILLED_NURSING_FACILITY): Payer: Medicare Other | Admitting: Internal Medicine

## 2014-08-14 DIAGNOSIS — F329 Major depressive disorder, single episode, unspecified: Secondary | ICD-10-CM

## 2014-08-14 DIAGNOSIS — F32A Depression, unspecified: Secondary | ICD-10-CM

## 2014-08-14 DIAGNOSIS — D471 Chronic myeloproliferative disease: Secondary | ICD-10-CM

## 2014-08-14 DIAGNOSIS — I25118 Atherosclerotic heart disease of native coronary artery with other forms of angina pectoris: Secondary | ICD-10-CM

## 2014-08-14 NOTE — Progress Notes (Signed)
MRN: [id number] Name: ORREN PIETSCH  Sex: male Age: 80 y.o. DOB: 03/04/1925  Chandler #: Helene Kelp Facility/Room:  Level Of Care: SNF Provider: Inocencio Homes D Emergency Contacts: Extended Emergency Contact Information Primary Emergency Contact: Tilda Burrow of [place] Phone: [phone] Relation: Friend Secondary Emergency Contact: Jorene Minors [place] of [place] Phone: [phone] Relation: Friend   Allergies: Cefprozil and Simvastatin  Chief Complaint  Patient presents with  . New Admit To SNF    HPI: Patient is 80 y.o. male who was hosp for syncope with neg w/u, felt to be 2/2 dehydration and poor po intake who is admitted to SNF for OT/PT  As a fall risk.  Past Medical History  Diagnosis Date  . Loss of height   . Compression fracture   . Hypertension   . Hypercholesterolemia   . Rhinitis   . Dyspnea   . Weight loss   . CAD (coronary artery disease)   . Peptic ulcer disease   . Alcohol abuse   . AMI (acute mesenteric ischemia)   . Colon polyps   . Pleurisy with effusion   . Grief reaction   . Macular degeneration   . Leukocytosis, unspecified 05/07/2014  . Bilateral leg edema 05/07/2014  . Recurrent falls 05/07/2014    Past Surgical History  Procedure Laterality Date  . Exploratory laparotomy  1978    due to kidney problems  . Thoracotomy  1982    hemothorax  . Back surgery  2003    Cervical Spondylosis  . Spine surgery  2003    C-spine  . Cardiac catheterization  06/21/03  . Coronary stent placement  07/2003    x3  . Coronary angioplasty with stent placement  05/2004  . Colonoscopy w/ polypectomy  11/2006      Medication List       This list is accurate as of: 08/14/14 11:59 PM.  Always use your most recent med list.               ANTIOXIDANT Tabs  Take by mouth daily.     aspirin 81 MG tablet  Take 81 mg by mouth daily.     budesonide-formoterol 160-4.5 MCG/ACT inhaler  Commonly known as:   SYMBICORT  Inhale 2 puffs into the lungs 2 (two) times daily.     CALCIUM & VIT D3 BONE HEALTH PO  Take by mouth daily.     feeding supplement (ENSURE COMPLETE) Liqd  Take 237 mLs by mouth 2 (two) times daily between meals.     Fish Oil 1000 MG Caps  Take by mouth daily.     furosemide 40 MG tablet  Commonly known as:  LASIX  Take 40 mg by mouth.     hydroxyurea 500 MG capsule  Commonly known as:  HYDREA  Take 1 capsule (500 mg total) by mouth daily. May take with food to minimize GI side effects.     isosorbide mononitrate 30 MG 24 hr tablet  Commonly known as:  IMDUR  Take 30 mg by mouth daily.     metoprolol succinate 25 MG 24 hr tablet  Commonly known as:  TOPROL-XL  Take 1 tablet (25 mg total) by mouth daily.     mirtazapine 15 MG disintegrating tablet  Commonly known as:  REMERON SOL-TAB  Take 1 tablet (15 mg total) by mouth at bedtime.     nitroGLYCERIN 0.4 MG SL tablet  Commonly known as:  NITROSTAT  Place 0.4 mg under  the tongue every 5 (five) minutes as needed for chest pain.     sertraline 50 MG tablet  Commonly known as:  ZOLOFT  Take 50 mg by mouth daily.        Meds ordered this encounter  Medications  . furosemide (LASIX) 40 MG tablet    Sig: Take 40 mg by mouth.  . nitroGLYCERIN (NITROSTAT) 0.4 MG SL tablet    Sig: Place 0.4 mg under the tongue every 5 (five) minutes as needed for chest pain.  Marland Kitchen sertraline (ZOLOFT) 50 MG tablet    Sig: Take 50 mg by mouth daily.  Marland Kitchen DISCONTD: isosorbide mononitrate (IMDUR) 120 MG 24 hr tablet    Sig: Take 120 mg by mouth daily.  . isosorbide mononitrate (IMDUR) 30 MG 24 hr tablet    Sig: Take 30 mg by mouth daily.    Immunization History  Administered Date(s) Administered  . Influenza Split 06/10/2012  . Influenza Whole 08/31/2005, 07/12/2008, 08/30/2010  . Influenza,inj,Quad PF,36+ Mos 06/23/2014  . Pneumococcal Conjugate-13 09/30/2013  . Pneumococcal Polysaccharide-23 10/13/1997  . Zoster 06/26/2012     History  Substance Use Topics  . Smoking status: Former Smoker    Quit date: 10/10/1946  . Smokeless tobacco: Never Used     Comment: cigars during college  . Alcohol Use: No     Comment: recovered ETOH abuser    Family history is noncontributory    Review of Systems  DATA OBTAINED: from patient, record GENERAL:  no fevers,+ fatigue,+ appetite changes SKIN: No itching, rash or wounds EYES: No eye pain, redness, discharge EARS: No earache, tinnitus, change in hearing NOSE: No congestion, drainage or bleeding  MOUTH/THROAT: No mouth or tooth pain, No sore throat RESPIRATORY: No cough, wheezing, SOB CARDIAC: No chest pain, palpitations, lower extremity edema  GI: No abdominal pain, No N/V/D or constipation, No heartburn or reflux  GU: No dysuria, frequency or urgency, or incontinence  MUSCULOSKELETAL: No unrelieved bone/joint pain NEUROLOGIC: No headache, dizziness  PSYCHIATRIC: , No behavior issue.depressed -has lost neighbor, son and girlfriend all to [place] in the past month   Filed Vitals:   08/14/14 2123  BP: 107/53  Pulse: 68  Temp: 98.4 F (36.9 C)  Resp: 15    Physical Exam  GENERAL APPEARANCE: Alert, modconversant,  No acute distress, WM  SKIN: No diaphoresis rash HEAD: Normocephalic, atraumatic  EYES: Conjunctiva/lids clear. Pupils round, reactive. EOMs intact.  EARS: External exam WNL, canals clear. Hearing grossly normal.  NOSE: No deformity or discharge.  MOUTH/THROAT: Lips w/o lesions  RESPIRATORY: Breathing is even, unlabored. Lung sounds are clear   CARDIOVASCULAR: Heart RRR no murmurs, rubs or gallops. No peripheral edema.   GASTROINTESTINAL: Abdomen is soft, non-tender, not distended w/ normal bowel sounds. GENITOURINARY: Bladder non tender, not distended  MUSCULOSKELETAL: No abnormal joints or musculature NEUROLOGIC:  Cranial nerves 2-12 grossly intact  PSYCHIATRIC: depressed which under the circumstances is normal, no behavioral  issues  Patient Active Problem List   Diagnosis Date Noted  . Protein-calorie malnutrition, severe 08/08/2014  . Syncope due to orthostatic hypotension 08/07/2014  . Syncope and collapse 08/07/2014  . Depression 07/29/2014  . Adult failure to thrive 07/29/2014  . Non-compliant behavior 07/17/2014  . Leukopenia due to antineoplastic chemotherapy 06/09/2014  . Hypotension 06/09/2014  . Myeloproliferative disease 05/16/2014  . Anemia in neoplastic disease 05/16/2014  . Leukocytosis, unspecified 05/07/2014  . Bilateral leg edema 05/07/2014  . Recurrent falls 05/07/2014  . Elevated platelet count 05/01/2014  . TIA (  transient ischemic attack) 03/19/2014  . Fall against object 02/15/2014  . Left shoulder pain 02/15/2014  . Rotator cuff injury 02/13/2014  . Hyperlipidemia 12/09/2013  . COPD (chronic obstructive pulmonary disease) 08/24/2012  . CANDIDIASIS 11/12/2010  . Constipation 11/12/2010  . RASH-NONVESICULAR 11/12/2010  . MELENA, HX OF 11/12/2010  . ANEMIA, MILD 09/13/2010  . Nonspecific abnormal finding in stool contents 08/30/2010  . Personal history of DVT (deep vein thrombosis) 05/12/2010  . RENAL INSUFFICIENCY 05/04/2010  . FOOT PAIN, LEFT 05/04/2010  . EDEMA- LOCALIZED 05/04/2010  . HYPERTENSION, UNSPECIFIED 01/22/2009  . RHINITIS 11/27/2008  . LOW HDL 11/29/2007  . Coronary atherosclerosis 11/29/2007  . COLONIC POLYPS 09/12/2007  . ALCOHOL ABUSE 09/12/2007  . AMI 09/12/2007  . PEPTIC ULCER DISEASE 09/12/2007  . COMPRESSION FRACTURE 09/12/2007  . PLEURISY WITHOUT MENTION EFFUS/CURRENT TB 09/10/2007    CBC    Component Value Date/Time   WBC 3.9* 08/10/2014 0515   WBC 3.2* 06/23/2014 1058   RBC 3.05* 08/10/2014 0515   RBC 2.75* 08/09/2014 0453   RBC 3.41* 06/23/2014 1058   HGB 9.6* 08/10/2014 0515   HGB 9.5* 06/23/2014 1058   HCT 28.9* 08/10/2014 0515   HCT 29.4* 06/23/2014 1058   PLT 238 08/10/2014 0515   PLT 218 06/23/2014 1058   MCV 94.8 08/10/2014 0515    MCV 86.2 06/23/2014 1058   LYMPHSABS 0.9 08/07/2014 1114   LYMPHSABS 1.3 06/23/2014 1058   MONOABS 0.3 08/07/2014 1114   MONOABS 0.3 06/23/2014 1058   EOSABS 0.1 08/07/2014 1114   EOSABS 0.1 06/23/2014 1058   BASOSABS 0.0 08/07/2014 1114   BASOSABS 0.0 06/23/2014 1058    CMP     Component Value Date/Time   NA 138 08/10/2014 0515   NA 143 05/07/2014 1445   K 4.4 08/10/2014 0515   K 4.4 05/07/2014 1445   CL 104 08/10/2014 0515   CO2 26 08/10/2014 0515   CO2 28 05/07/2014 1445   GLUCOSE 99 08/10/2014 0515   GLUCOSE 102 05/07/2014 1445   BUN 20 08/10/2014 0515   BUN 21.6 05/07/2014 1445   CREATININE 1.10 08/10/2014 0515   CREATININE 1.2 05/07/2014 1445   CALCIUM 8.8 08/10/2014 0515   CALCIUM 8.6 05/07/2014 1445   PROT 5.7* 05/07/2014 1445   PROT 6.0 04/24/2014 1700   ALBUMIN 3.3* 05/07/2014 1445   ALBUMIN 3.5 04/24/2014 1700   AST 25 05/07/2014 1445   AST 33 04/24/2014 1700   ALT 18 05/07/2014 1445   ALT 32 04/24/2014 1700   ALKPHOS 103 05/07/2014 1445   ALKPHOS 117 04/24/2014 1700   BILITOT 0.43 05/07/2014 1445   BILITOT 0.5 04/24/2014 1700   GFRNONAA 58* 08/10/2014 0515   GFRAA 68* 08/10/2014 0515    Assessment and Plan  Syncope due to orthostatic hypotension Syncope likely from dehydration, malnutrition. Echo ok. Orthostatics negative after fluid resecutation  Coronary atherosclerosis ASA, Imdur,bblocker  Depression likely leading to weight loss, poor intake. meds adjusted by psychiatry   Myeloproliferative disease Continue hydroxurea;followed by onc    Hennie Duos, MD

## 2014-08-15 DIAGNOSIS — F0390 Unspecified dementia without behavioral disturbance: Secondary | ICD-10-CM

## 2014-08-15 DIAGNOSIS — F328 Other depressive episodes: Secondary | ICD-10-CM

## 2014-08-15 DIAGNOSIS — R627 Adult failure to thrive: Secondary | ICD-10-CM

## 2014-08-15 DIAGNOSIS — M6281 Muscle weakness (generalized): Secondary | ICD-10-CM

## 2014-08-16 ENCOUNTER — Encounter: Payer: Self-pay | Admitting: Internal Medicine

## 2014-08-16 NOTE — Assessment & Plan Note (Signed)
Continue hydroxurea;followed by onc

## 2014-08-16 NOTE — Assessment & Plan Note (Addendum)
likely leading to weight loss, poor intake. meds adjusted by psychiatry; pt has lost son, girlfriend and neighbor to [place], all in the past month

## 2014-08-16 NOTE — Assessment & Plan Note (Signed)
Syncope likely from dehydration, malnutrition. Echo ok. Orthostatics negative after fluid resecutation

## 2014-08-16 NOTE — Assessment & Plan Note (Signed)
ASA, Imdur,bblocker

## 2014-08-26 ENCOUNTER — Non-Acute Institutional Stay (SKILLED_NURSING_FACILITY): Payer: Medicare Other | Admitting: Nurse Practitioner

## 2014-08-26 DIAGNOSIS — I951 Orthostatic hypotension: Secondary | ICD-10-CM

## 2014-08-26 DIAGNOSIS — F329 Major depressive disorder, single episode, unspecified: Secondary | ICD-10-CM

## 2014-08-26 DIAGNOSIS — I10 Essential (primary) hypertension: Secondary | ICD-10-CM

## 2014-08-26 DIAGNOSIS — F32A Depression, unspecified: Secondary | ICD-10-CM

## 2014-08-26 DIAGNOSIS — D471 Chronic myeloproliferative disease: Secondary | ICD-10-CM

## 2014-08-26 DIAGNOSIS — J449 Chronic obstructive pulmonary disease, unspecified: Secondary | ICD-10-CM

## 2014-08-26 NOTE — Progress Notes (Signed)
Patient ID: Daniel Ali, male   DOB: December 25, 1924, 80 y.o.   MRN: [id number]    Nursing Home Location:  [facility]   Place of Service: SNF (31)  PCP: Unice Cobble, MD  Allergies  Allergen Reactions  . Cefprozil     REACTION: insomnia  . Simvastatin     REACTION: MUSCLE ACHES    Chief Complaint  Patient presents with  . Discharge Note    HPI:  Patient is a 80 y.o. male seen today at [facility] for discharged home. Pt was hospitalized for syncope with negative work up, felt to be due to dehydration and poor po intake who is admitted to SNF for OT/PT/ Patient currently doing well with therapy, now stable to discharge home with home health and his son.   Review of Systems:  Review of Systems  Constitutional: Negative for activity change, appetite change, fatigue and unexpected weight change.  HENT: Negative for congestion and hearing loss.   Eyes: Negative.   Respiratory: Negative for cough and shortness of breath.   Cardiovascular: Negative for chest pain, palpitations and leg swelling.  Gastrointestinal: Negative for abdominal pain, diarrhea and constipation.  Genitourinary: Negative for dysuria and difficulty urinating.  Musculoskeletal: Negative for myalgias and arthralgias.  Skin: Negative for color change and wound.  Neurological: Negative for dizziness and weakness.  Psychiatric/Behavioral: Negative for behavioral problems, confusion and agitation.       Pt with depression     Past Medical History  Diagnosis Date  . Loss of height   . Compression fracture   . Hypertension   . Hypercholesterolemia   . Rhinitis   . Dyspnea   . Weight loss   . CAD (coronary artery disease)   . Peptic ulcer disease   . Alcohol abuse   . AMI (acute mesenteric ischemia)   . Colon polyps   . Pleurisy with effusion   . Grief reaction   . Macular degeneration   . Leukocytosis, unspecified 05/07/2014  . Bilateral leg edema 05/07/2014  .  Recurrent falls 05/07/2014   Past Surgical History  Procedure Laterality Date  . Exploratory laparotomy  1978    due to kidney problems  . Thoracotomy  1982    hemothorax  . Back surgery  2003    Cervical Spondylosis  . Spine surgery  2003    C-spine  . Cardiac catheterization  06/21/03  . Coronary stent placement  07/2003    x3  . Coronary angioplasty with stent placement  05/2004  . Colonoscopy w/ polypectomy  11/2006   Social History:   reports that he quit smoking about 67 years ago. He has never used smokeless tobacco. He reports that he does not drink alcohol or use illicit drugs.  Family History  Problem Relation Age of Onset  . Diabetes Father   . Colon cancer Mother   . Colon cancer Son     Medications: Patient's Medications  New Prescriptions   No medications on file  Previous Medications   ASPIRIN 81 MG TABLET    Take 81 mg by mouth daily.     BUDESONIDE-FORMOTEROL (SYMBICORT) 160-4.5 MCG/ACT INHALER    Inhale 2 puffs into the lungs 2 (two) times daily.   FEEDING SUPPLEMENT, ENSURE COMPLETE, (ENSURE COMPLETE) LIQD    Take 237 mLs by mouth 2 (two) times daily between meals.   FUROSEMIDE (LASIX) 40 MG TABLET    Take 40 mg by mouth.   HYDROXYUREA (HYDREA) 500 MG  CAPSULE    Take 1 capsule (500 mg total) by mouth daily. May take with food to minimize GI side effects.   ISOSORBIDE MONONITRATE (IMDUR) 30 MG 24 HR TABLET    Take 30 mg by mouth daily.   METOPROLOL SUCCINATE (TOPROL-XL) 25 MG 24 HR TABLET    Take 1 tablet (25 mg total) by mouth daily.   MULTIPLE MINERALS-VITAMINS (CALCIUM & VIT D3 BONE HEALTH PO)    Take by mouth daily.    MULTIPLE VITAMINS-MINERALS (ANTIOXIDANT) TABS    Take by mouth daily.     NITROGLYCERIN (NITROSTAT) 0.4 MG SL TABLET    Place 0.4 mg under the tongue every 5 (five) minutes as needed for chest pain.   OMEGA-3 FATTY ACIDS (FISH OIL) 1000 MG CAPS    Take by mouth daily.   SERTRALINE (ZOLOFT) 50 MG TABLET    Take 50 mg by mouth daily.    Modified Medications   No medications on file  Discontinued Medications   MIRTAZAPINE (REMERON SOL-TAB) 15 MG DISINTEGRATING TABLET    Take 1 tablet (15 mg total) by mouth at bedtime.     Physical Exam: Filed Vitals:   08/26/14 2104  BP: 132/40  Pulse: 80  Temp: 97.4 F (36.3 C)  Resp: 18    Physical Exam  Constitutional: He is oriented to person, place, and time. He appears well-developed and well-nourished. No distress.  HENT:  Head: Normocephalic and atraumatic.  Mouth/Throat: Oropharynx is clear and moist. No oropharyngeal exudate.  Eyes: Conjunctivae and EOM are normal. Pupils are equal, round, and reactive to light.  Neck: Normal range of motion. Neck supple.  Cardiovascular: Normal rate, regular rhythm and normal heart sounds.   Pulmonary/Chest: Effort normal and breath sounds normal.  Abdominal: Soft. Bowel sounds are normal.  Musculoskeletal: He exhibits no edema or tenderness.  Neurological: He is alert and oriented to person, place, and time.  Skin: Skin is warm and dry. He is not diaphoretic.  Psychiatric: He has a normal mood and affect.    Labs reviewed: Basic Metabolic Panel:  Recent Labs  05/30/14 1345 08/07/14 1114 08/10/14 0515  NA 139 137 138  K 4.4 4.5 4.4  CL 104 103 104  CO2 29 23 26   GLUCOSE 74 112* 99  BUN 23 25* 20  CREATININE 1.2 1.16 1.10  CALCIUM 8.5 8.7 8.8   Liver Function Tests:  Recent Labs  02/12/14 1717 04/24/14 1700 05/07/14 1445  AST 25 33 25  ALT 20 32 18  ALKPHOS 71 117 103  BILITOT 0.6 0.5 0.43  PROT 6.1 6.0 5.7*  ALBUMIN 3.9 3.5 3.3*   No results for input(s): LIPASE, AMYLASE in the last 8760 hours. No results for input(s): AMMONIA in the last 8760 hours. CBC:  Recent Labs  06/09/14 1423 06/23/14 1058 08/07/14 1114 08/10/14 0515  WBC 2.8* 3.2* 3.7* 3.9*  NEUTROABS 1.3* 1.4* 2.4  --   HGB 10.4* 9.5* 9.7* 9.6*  HCT 31.8* 29.4* 28.2* 28.9*  MCV 82.4 86.2 94.0 94.8  PLT 190 Large & giant platelets 218  208 238   TSH:  Recent Labs  08/09/14 0453  TSH 1.080   A1C: No results found for: HGBA1C Lipid Panel: No results for input(s): CHOL, HDL, LDLCALC, TRIG, CHOLHDL, LDLDIRECT in the last 8760 hours.   Assessment/Plan  1. Essential hypertension Blood pressure has been stable  2. Chronic obstructive pulmonary disease, unspecified COPD, unspecified chronic bronchitis type Stable, cont symbicort   3. Depression -followed by psych  while here at [facility], will need outpt follow up   4. Myeloproliferative disease Cont oncology follow up   5. Syncope due to orthostatic hypotension No additional syncopal episodes. pt is stable for discharge-will need PT/OT/SW per home health. Rx written.  will need to follow up with PCP within 2 weeks. pts son lives out of town but is coming into town to help with transition back home.

## 2014-09-01 ENCOUNTER — Encounter: Payer: Medicare Other | Admitting: Physician Assistant

## 2014-09-01 ENCOUNTER — Encounter: Payer: Self-pay | Admitting: Internal Medicine

## 2014-09-01 ENCOUNTER — Ambulatory Visit (INDEPENDENT_AMBULATORY_CARE_PROVIDER_SITE_OTHER): Payer: Medicare Other | Admitting: Internal Medicine

## 2014-09-01 ENCOUNTER — Ambulatory Visit (INDEPENDENT_AMBULATORY_CARE_PROVIDER_SITE_OTHER)
Admission: RE | Admit: 2014-09-01 | Discharge: 2014-09-01 | Disposition: A | Discharge status: Home / Self Care | Payer: Medicare Other | Source: Ambulatory Visit | Attending: Internal Medicine | Admitting: Internal Medicine

## 2014-09-01 VITALS — BP 100/50 | HR 66 | Temp 98.2°F | Resp 14 | Wt 165.0 lb

## 2014-09-01 DIAGNOSIS — J209 Acute bronchitis, unspecified: Secondary | ICD-10-CM

## 2014-09-01 DIAGNOSIS — R042 Hemoptysis: Secondary | ICD-10-CM

## 2014-09-01 DIAGNOSIS — R627 Adult failure to thrive: Secondary | ICD-10-CM

## 2014-09-01 DIAGNOSIS — R55 Syncope and collapse: Secondary | ICD-10-CM

## 2014-09-01 MED ORDER — AZITHROMYCIN 250 MG PO TABS: ORAL_TABLET | ORAL | Status: DC

## 2014-09-01 NOTE — Progress Notes (Deleted)
Cardiology Office Note   Date:  09/01/2014   ID:  [place], Monds 01-Apr-1925, MRN [id number]  PCP:  Unice Cobble, MD  Cardiologist:  Dr. Lauree Chandler     History of Present Illness: Daniel Ali is a 80 y.o. male with a history of CAD, prior PCI to the LAD, HTN, HL, COPD, myeloproliferative disease.  He is followed by hematology.  He is on hydroxyurea for thrombocytosis and hx of TIA.  He is intolerant to statins. He was previously followed by Dr. Lia Foyer. He established with Dr. Angelena Form in 09/2013. last seen in our office in 05/2014 with LE edema.  Lasix was started and he had a FU echo that demonstrated normal LVF.  He was admitted earlier this month with syncope felt to be related to dehydration and malnutrition in the setting of situational depression (grief reaction).  He was DC to SNF.  He returns for FU.  He saw primary care this AM.  He has a productive cough and CBC and CXR are both pending.  ***   Studies:  - LHC (05/2004): EF 60%; mid LAD 75%, distal LAD 60-70%. PCI: Taxus (12 x 2.75 mm) DES to the mid LAD; Taxus (12 x 2.75 mm) DES to the distal LAD. Dx "pinched" to 50-60% after PCI.  - Echo (10/15): EF 55-60%, normal wall motion   Recent Labs/Images:  05/07/2014: ALT 18 08/09/2014: TSH 1.080 08/10/2014: BUN 20; Creatinine 1.10; Hemoglobin 9.6*; Potassium 4.4; Sodium 138    - Dg Chest 2 View   09/01/2014     IMPRESSION: No acute abnormalities.   Electronically Signed   By: Rozetta Nunnery M.D.   On: 09/01/2014 14:01     Wt Readings from Last 3 Encounters:  09/01/14 165 lb (74.844 kg)  08/06/14 167 lb 12.8 oz (76.114 kg)  07/29/14 167 lb 6 oz (75.921 kg)     Past Medical History  Diagnosis Date  . Loss of height   . Compression fracture   . Hypertension   . Hypercholesterolemia   . Rhinitis   . Dyspnea   . Weight loss   . CAD (coronary artery disease)   . Peptic ulcer disease   . Alcohol abuse   . AMI (acute mesenteric ischemia)   .  Colon polyps   . Pleurisy with effusion   . Grief reaction   . Macular degeneration   . Leukocytosis, unspecified 05/07/2014  . Bilateral leg edema 05/07/2014  . Recurrent falls 05/07/2014    Current Outpatient Prescriptions  Medication Sig Dispense Refill  . aspirin 81 MG tablet Take 81 mg by mouth daily.      Marland Kitchen azithromycin (ZITHROMAX Z-PAK) 250 MG tablet 2 day 1, then 1 qd 6 tablet 0  . budesonide-formoterol (SYMBICORT) 160-4.5 MCG/ACT inhaler Inhale 2 puffs into the lungs 2 (two) times daily.    . feeding supplement, ENSURE COMPLETE, (ENSURE COMPLETE) LIQD Take 237 mLs by mouth 2 (two) times daily between meals.    . furosemide (LASIX) 40 MG tablet Take 40 mg by mouth.    . hydroxyurea (HYDREA) 500 MG capsule Take 1 capsule (500 mg total) by mouth daily. May take with food to minimize GI side effects. 30 capsule 3  . isosorbide mononitrate (IMDUR) 30 MG 24 hr tablet Take 30 mg by mouth daily.    . metoprolol succinate (TOPROL-XL) 25 MG 24 hr tablet Take 1 tablet (25 mg total) by mouth daily. (Patient taking differently: Take 12.5 mg by mouth daily. )    .  Multiple Minerals-Vitamins (CALCIUM & VIT D3 BONE HEALTH PO) Take by mouth daily.     . Multiple Vitamins-Minerals (ANTIOXIDANT) TABS Take by mouth daily.      . nitroGLYCERIN (NITROSTAT) 0.4 MG SL tablet Place 0.4 mg under the tongue every 5 (five) minutes as needed for chest pain.    . Omega-3 Fatty Acids (FISH OIL) 1000 MG CAPS Take by mouth daily.    . sertraline (ZOLOFT) 50 MG tablet Take 50 mg by mouth daily.     No current facility-administered medications for this visit.     Allergies:   Cefprozil and Simvastatin   Social History:  The patient  reports that he quit smoking about 67 years ago. He has never used smokeless tobacco. He reports that he does not drink alcohol or use illicit drugs.   Family History:  The patient's family history includes Colon cancer in his mother and son; Diabetes in his father.   ROS:  Please  see the history of present illness.   ***   All other systems reviewed and negative.    PHYSICAL EXAM: VS:  There were no vitals taken for this visit. Well nourished, well developed, in no acute distress HEENT: normal Neck: *** JVD Cardiac:  normal S1, S2; ***RRR; *** murmur *** Lungs:  ***clear to auscultation bilaterally, no wheezing, rhonchi or rales Abd: soft, nontender, no hepatomegaly Ext: *** edema Skin: warm and dry Neuro:  CNs 2-12 intact, no focal abnormalities noted  EKG:  ***      ASSESSMENT AND PLAN:  Coronary Artery Disease  Essential hypertension  HLD (hyperlipidemia)  Chronic obstructive pulmonary disease, unspecified COPD, unspecified chronic bronchitis type  Bilateral leg edema  Myeloproliferative disease   Disposition:   FU with ***   Signed, Richardson Dopp, PA-C, MHS 09/01/2014 1:58 PM    [facility] [address], [place], [place]  [address] Phone: [phone]; Fax: [phone]

## 2014-09-01 NOTE — Progress Notes (Signed)
Pre visit review using our clinic review tool, if applicable. No additional management support is needed unless otherwise documented below in the visit note. 

## 2014-09-01 NOTE — Progress Notes (Signed)
This encounter was created in error - please disregard.

## 2014-09-01 NOTE — Patient Instructions (Addendum)
Your next office appointment will be determined based upon review of your pending labs & x-rays. Those instructions will be transmitted by phone or mail.  Followup as needed for your acute issue. Please report any significant change in your symptoms.  Carry room temperature water and sip liberally after coughing.

## 2014-09-01 NOTE — Progress Notes (Signed)
   Subjective:    Patient ID: Daniel Ali, male    DOB: 1925-10-05, 80 y.o.   MRN: [id number]  HPI   He was hospitalized 10/29-11/2/15 following an episode of syncope. While hospitalized he was treated for significant depression  He has been discharged and is now at [facility].  The tentative plans are for him to either return home with assisted care as-needed if possible; independent living @ a facility; or assisted living in a retirement facility.  We talked about the pros and cons of all those options.  He has had yellow/green sputum production recently with some hemoptysis. This is associated with compromised respiratory function and significant exertional dyspnea.  According to his son the home is in disrepair and needs significant upgrading for him to live there.    Review of Systems   He also describes some reddish otic discharge  but no other bleeding dyscrasias.  He is recovering from fractured ribs from a prior fall  He also describes significant nasal congestion.       Objective:   Physical Exam    Pertinent or positive findings include: He has pattern alopecia. There are small papular lesions over the medial  mandibular mucosa more so on the left. I do not see active bleeding in this area. Breath sounds are decreased. He has increased AP diameter compatible with emphysema Heart sounds are also distant. There is faint sock line edema. Pedal pulses are surprisingly good Affect is flat.  General appearance :adequately nourished; in no distress. Eyes: No conjunctival inflammation or scleral icterus is present. Oral exam: Dental hygiene is good. Lips and gums are healthy appearing.There is no oropharyngeal erythema or exudate noted.  Heart:  Normal rate and regular rhythm. S1 and S2 normal without gallop, murmur, click, rub or other extra sounds   Lungs:Chest clear to auscultation; no wheezes, rhonchi,rales ,or rubs present.No increased work  of breathing.  Abdomen: bowel sounds normal, soft and non-tender without masses, organomegaly or hernias noted.  No guarding or rebound. Vascular : all pulses equal ; no bruits present. Skin:Warm & dry.  Intact without suspicious lesions or rashes ; no jaundice or tenting Lymphatic: No lymphadenopathy is noted about the head, neck, axilla            Assessment & Plan:  #1 acute bronchitis #2 hemoptysis #3 syncope #4 adult failure to thrive See Orders & AVS

## 2014-09-02 ENCOUNTER — Telehealth: Payer: Self-pay | Admitting: *Deleted

## 2014-09-02 NOTE — Telephone Encounter (Signed)
Left msg on triage stating pt saw md yesterday and was given rx for z-pack. We are needing consult from md. Called Judeen Hammans back she stated that the pt had the z-pack in his room yesterday & he is not allowed to have medicine in his room. No one gave them any orders for pt to take. Inform sherry per ov md rx z-pack will fax over ov notes to [phone]...Daniel Ali

## 2014-09-08 ENCOUNTER — Other Ambulatory Visit: Payer: Self-pay | Admitting: Internal Medicine

## 2014-09-08 ENCOUNTER — Telehealth: Payer: Self-pay | Admitting: *Deleted

## 2014-09-08 ENCOUNTER — Telehealth: Payer: Self-pay | Admitting: Hematology and Oncology

## 2014-09-08 DIAGNOSIS — J42 Unspecified chronic bronchitis: Secondary | ICD-10-CM

## 2014-09-08 NOTE — Telephone Encounter (Signed)
pt called to sched appt....done....pt ok adna ware °

## 2014-09-09 ENCOUNTER — Ambulatory Visit (HOSPITAL_BASED_OUTPATIENT_CLINIC_OR_DEPARTMENT_OTHER): Payer: Medicare Other | Admitting: Hematology and Oncology

## 2014-09-09 ENCOUNTER — Telehealth: Payer: Self-pay | Admitting: Hematology and Oncology

## 2014-09-09 ENCOUNTER — Other Ambulatory Visit (HOSPITAL_BASED_OUTPATIENT_CLINIC_OR_DEPARTMENT_OTHER): Payer: Medicare Other

## 2014-09-09 ENCOUNTER — Encounter: Payer: Self-pay | Admitting: Hematology and Oncology

## 2014-09-09 VITALS — BP 110/47 | HR 63 | Temp 98.2°F | Resp 18 | Ht 67.0 in | Wt 169.5 lb

## 2014-09-09 DIAGNOSIS — D649 Anemia, unspecified: Secondary | ICD-10-CM

## 2014-09-09 DIAGNOSIS — R059 Cough, unspecified: Secondary | ICD-10-CM | POA: Insufficient documentation

## 2014-09-09 DIAGNOSIS — D630 Anemia in neoplastic disease: Secondary | ICD-10-CM

## 2014-09-09 DIAGNOSIS — C946 Myelodysplastic disease, not classified: Secondary | ICD-10-CM

## 2014-09-09 DIAGNOSIS — R05 Cough: Secondary | ICD-10-CM

## 2014-09-09 DIAGNOSIS — D471 Chronic myeloproliferative disease: Secondary | ICD-10-CM

## 2014-09-09 LAB — CBC WITH DIFFERENTIAL/PLATELET
BASO%: 0.5 % (ref 0.0–2.0)
Basophils Absolute: 0 10*3/uL (ref 0.0–0.1)
EOS%: 1.8 % (ref 0.0–7.0)
Eosinophils Absolute: 0.1 10*3/uL (ref 0.0–0.5)
HCT: 32.2 % — ABNORMAL LOW (ref 38.4–49.9)
HGB: 10.6 g/dL — ABNORMAL LOW (ref 13.0–17.1)
LYMPH%: 32 % (ref 14.0–49.0)
MCH: 34.2 pg — ABNORMAL HIGH (ref 27.2–33.4)
MCHC: 32.9 g/dL (ref 32.0–36.0)
MCV: 103.9 fL — AB (ref 79.3–98.0)
MONO#: 0.4 10*3/uL (ref 0.1–0.9)
MONO%: 8.1 % (ref 0.0–14.0)
NEUT#: 2.5 10*3/uL (ref 1.5–6.5)
NEUT%: 57.6 % (ref 39.0–75.0)
NRBC: 0 % (ref 0–0)
PLATELETS: 201 10*3/uL (ref 140–400)
RBC: 3.1 10*6/uL — ABNORMAL LOW (ref 4.20–5.82)
RDW: 23.2 % — ABNORMAL HIGH (ref 11.0–14.6)
WBC: 4.3 10*3/uL (ref 4.0–10.3)
lymph#: 1.4 10*3/uL (ref 0.9–3.3)

## 2014-09-09 NOTE — Assessment & Plan Note (Signed)
This is due to recent illness and hydroxyurea. I will taper his hydroxyurea to every other day.
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
## 2014-09-09 NOTE — Telephone Encounter (Signed)
Pt confirmed labs/ov per 12/01 POF, gave pt AVS.... KJ °

## 2014-09-09 NOTE — Assessment & Plan Note (Signed)
He is not tolerating treatment well, especially with recent illness and hospitalization. I have given instruction to the nursing home to change to frequency of his treatment with hydroxyurea to every other day and only to be given on Mondays, Wednesdays and Fridays.

## 2014-09-09 NOTE — Assessment & Plan Note (Signed)
Recent chest x-ray was negative.  He has no sign of leukocytosis. He has been prescribed antibiotics by his primary doctor recently.

## 2014-09-11 ENCOUNTER — Other Ambulatory Visit: Payer: Self-pay | Admitting: Internal Medicine

## 2014-09-11 ENCOUNTER — Encounter: Payer: Self-pay | Admitting: Internal Medicine

## 2014-09-11 DIAGNOSIS — F329 Major depressive disorder, single episode, unspecified: Secondary | ICD-10-CM

## 2014-09-11 DIAGNOSIS — F0631 Mood disorder due to known physiological condition with depressive features: Secondary | ICD-10-CM | POA: Insufficient documentation

## 2014-09-11 DIAGNOSIS — F32A Depression, unspecified: Secondary | ICD-10-CM

## 2014-09-16 ENCOUNTER — Ambulatory Visit (INDEPENDENT_AMBULATORY_CARE_PROVIDER_SITE_OTHER): Payer: Medicare Other | Admitting: Internal Medicine

## 2014-09-16 ENCOUNTER — Encounter: Payer: Self-pay | Admitting: Internal Medicine

## 2014-09-16 VITALS — BP 106/50 | HR 70 | Temp 98.1°F | Wt 178.4 lb

## 2014-09-16 DIAGNOSIS — R4586 Emotional lability: Secondary | ICD-10-CM

## 2014-09-16 DIAGNOSIS — K5900 Constipation, unspecified: Secondary | ICD-10-CM

## 2014-09-16 DIAGNOSIS — R413 Other amnesia: Secondary | ICD-10-CM

## 2014-09-16 DIAGNOSIS — F39 Unspecified mood [affective] disorder: Secondary | ICD-10-CM

## 2014-09-16 DIAGNOSIS — K625 Hemorrhage of anus and rectum: Secondary | ICD-10-CM
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
## 2014-09-16 NOTE — Patient Instructions (Addendum)
Natural interventions to treat or prevent constipation would include drinking to thirst, up to 32 ounces of fluids daily; eating 7-9 servings of fresh fruits or vegetables a day; and increasing roughage in the diet such as whole grains. The OTC  fiber products (Example: Metamucil, etc) can be employed if these natural maneuvers do not correct the issue. Finally MiraLax every third day as needed is an option.  Cardiovascular exercise, this can be as simple a program as walking, is recommended 30-45 minutes 3-4 times per week.You should take 6-8 weeks to build up to this level.

## 2014-09-16 NOTE — Progress Notes (Signed)
Pre visit review using our clinic review tool, if applicable. No additional management support is needed unless otherwise documented below in the visit note. 

## 2014-09-19 ENCOUNTER — Ambulatory Visit (INDEPENDENT_AMBULATORY_CARE_PROVIDER_SITE_OTHER): Payer: Medicare Other | Admitting: Internal Medicine

## 2014-09-19 ENCOUNTER — Telehealth: Payer: Self-pay | Admitting: Internal Medicine

## 2014-09-19 ENCOUNTER — Encounter: Payer: Self-pay | Admitting: Internal Medicine

## 2014-09-19 VITALS — BP 108/58 | HR 67 | Temp 98.6°F | Ht 67.0 in | Wt 177.0 lb

## 2014-09-19 DIAGNOSIS — J449 Chronic obstructive pulmonary disease, unspecified: Secondary | ICD-10-CM

## 2014-09-19 MED ORDER — PANTOPRAZOLE SODIUM 40 MG PO TBEC: 40.0000 mg | DELAYED_RELEASE_TABLET | Freq: Every day | ORAL | Status: AC

## 2014-09-19 MED ORDER — METHYLPREDNISOLONE ACETATE 80 MG/ML IJ SUSP
120.0000 mg | Freq: Once | INTRAMUSCULAR | Status: AC
  Administered 2014-09-19: 120 mg via INTRAMUSCULAR

## 2014-09-19 NOTE — Telephone Encounter (Signed)
Pt needs rx for protonix sent to pharmacy. Rx sent. Pt is aware. Jud Bing, CMA

## 2014-09-19 NOTE — Progress Notes (Signed)
Subjective:     Patient ID: Daniel Ali, male   DOB: [birth year],    MRN: [id number]  HPI  78 yowm quit smoking 1946 with GOLD I copd documented 08/10/12 referred by Dr Linna Darner for cough/sob indolent onset in Fall 2015    09/19/2014 1st [facility] office visit/ Teja Judice   Chief Complaint  Patient presents with  . Advice Only    Pt referred by Dr. Linna Darner COPD-pt c/o sob with wheezing and cough. He has bloody mucus and chest tightness.   onset was indolent x sev months persistent daily since then assoc with sense of pnds and no improvement in cough or wheezing while maint on symbiocort chronically  And after rx with Zpack 09/01/14 not better  No obvious day to day or daytime variabilty or assoc   cp or    overt sinus or hb symptoms. No unusual exp hx or h/o childhood pna/ asthma or knowledge of premature birth.  Sleeping ok without nocturnal  or early am exacerbation  of respiratory  c/o's or need for noct saba. Also denies any obvious fluctuation of symptoms with weather or environmental changes or other aggravating or alleviating factors except as outlined above   Current Medications, Allergies, Complete Past Medical History, Past Surgical History, Family History, and Social History were reviewed in [employer] [facility] record.  ROS  The following are not active complaints unless bolded sore throat, dysphagia, dental problems, itching, sneezing,  nasal congestion or excess/ purulent secretions, ear ache,   fever, chills, sweats, unintended wt loss, pleuritic or exertional cp, hemoptysis,  orthopnea pnd or leg swelling, presyncope, palpitations, heartburn, abdominal pain, anorexia, nausea, vomiting, diarrhea  or change in bowel or urinary habits, change in stools or urine, dysuria,hematuria,  rash, arthralgias, visual complaints, headache, numbness weakness or ataxia or problems with walking or coordination,  change in mood/affect or memory.         Review of Systems    Objective:   Physical Exam    Hoarse amb wm nad with mild pseudowheeze   Wt Readings from Last 3 Encounters:  09/19/14 177 lb (80.287 kg)  09/16/14 178 lb 6 oz (80.91 kg)  09/09/14 169 lb 8 oz (76.885 kg)    Vital signs reviewed  HEENT: nl dentition, turbinates, and orophanx. Nl external ear canals without cough reflex   NECK :  without JVD/Nodes/TM/ nl carotid upstrokes bilaterally   LUNGS: no acc muscle use, clear to A and P bilaterally without cough on insp or exp maneuvers   CV:  RRR  no s3 or murmur or increase in P2, no edema   ABD:  soft and nontender with nl excursion in the supine position. No bruits or organomegaly, bowel sounds nl  MS:  warm without deformities, calf tenderness, cyanosis or clubbing  SKIN: warm and dry without lesions    NEURO:  alert, approp, no deficits    cxr 09/01/14 Heart size and pulmonary vascularity are normal. There is minimal scarring at both lung bases. The lungs are otherwise clear. No effusions. No acute osseous abnormality.     Assessment:

## 2014-09-19 NOTE — Patient Instructions (Addendum)
Pantoprazole (protonix) 40 mg   Take 30-60 min before first meal of the day and Pepcid 20 mg one bedtime until return to office - this is the best way to tell whether stomach acid is contributing to your problem.    GERD (REFLUX)  is an extremely common cause of respiratory symptoms just like yours , many times with no obvious heartburn at all.    It can be treated with medication, but also with lifestyle changes including avoidance of late meals, excessive alcohol, smoking cessation, and avoid fatty foods, chocolate, peppermint, colas, red wine, and acidic juices such as orange juice.  NO MINT OR MENTHOL PRODUCTS SO NO COUGH DROPS  USE SUGARLESS CANDY INSTEAD (Jolley ranchers or Stover's or Life Savers) or even ice chips will also do - the key is to swallow to prevent all throat clearing. NO OIL BASED VITAMINS - use powdered substitutes.   Please schedule a follow up office visit in 4 weeks, sooner if needed with pfts on return

## 2014-09-20 NOTE — Assessment & Plan Note (Addendum)
08/10/12  FEV1  1.70 (85%) . FEV1/FVC ratio 53% pre-and 55% post bronchodilator.  Normal diffusing capacity. 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  DDX of  difficult airways management all start with A and  include Adherence, Ace Inhibitors, Acid Reflux, Active Sinus Disease, Alpha 1 Antitripsin deficiency, Anxiety masquerading as Airways dz,  ABPA,  allergy(esp in young), Aspiration (esp in elderly), Adverse effects of DPI,  Active smokers, plus two Bs  = Bronchiectasis and Beta blocker use..and one C= CHF   Adherence is always the initial "prime suspect" and is a multilayered concern that requires a "trust but verify" approach in every patient - starting with knowing how to use medications, especially inhalers, correctly, keeping up with refills and understanding the fundamental difference between maintenance and prns vs those medications only taken for a very short course and then stopped and not refilled.  The proper method of use, as well as anticipated side effects, of a metered-dose inhaler are discussed and demonstrated to the patient. Improved effectiveness after extensive coaching during this visit to a level of approximately  75% so continue symbicort for now  ? Acid (or non-acid) GERD > always difficult to exclude as up to 75% of pts in some series report no assoc GI/ Heartburn symptoms> rec max (24h)  acid suppression and diet restrictions/ reviewed and instructions given in writing.   ? Aspiration also a concern so if not responding to gerd rx consider swallowing eval  ? Allergy/ / asthmatic component > doubt but cover with depomedrol 120 today    Each maintenance medication was reviewed in detail including most importantly the difference between maintenance and as needed and under what circumstances the prns are to be used.  Please see instructions for details which were reviewed in writing and the patient given a copy.

## 2014-09-24 ENCOUNTER — Telehealth: Payer: Self-pay | Admitting: Internal Medicine

## 2014-09-24 NOTE — Telephone Encounter (Signed)
Left message with son to schedule TB test for patient on M,T, or Wed.  With 48 hour fu.  Son stated he will call back to schedule.

## 2014-09-26 ENCOUNTER — Ambulatory Visit (INDEPENDENT_AMBULATORY_CARE_PROVIDER_SITE_OTHER): Payer: Medicare Other | Admitting: Internal Medicine

## 2014-09-26 ENCOUNTER — Encounter: Payer: Self-pay | Admitting: Internal Medicine

## 2014-09-26 VITALS — BP 128/66 | HR 58 | Temp 98.1°F | Wt 174.5 lb

## 2014-09-26 DIAGNOSIS — J449 Chronic obstructive pulmonary disease, unspecified: Secondary | ICD-10-CM

## 2014-09-26 DIAGNOSIS — R627 Adult failure to thrive: Secondary | ICD-10-CM

## 2014-09-26 DIAGNOSIS — Z23 Encounter for immunization: Secondary | ICD-10-CM

## 2014-09-26 DIAGNOSIS — Z8719 Personal history of other diseases of the digestive system: Secondary | ICD-10-CM

## 2014-09-26 NOTE — Progress Notes (Signed)
Pre visit review using our clinic review tool, if applicable. No additional management support is needed unless otherwise documented below in the visit note. 

## 2014-09-26 NOTE — Progress Notes (Signed)
   Subjective:    Patient ID: Daniel Ali, male    DOB: May 31, 1925, 80 y.o.   MRN: [id number]  HPI The rectal bleeding has resolved; he states he's had 2 normal bowel movements since his last visit  He denies any other bleeding dyscrasias although he is on Hydrea for thrombocytosis.  He continues to have some cough with mucus production; his respiratory status is stable. He has seen pulmonology.  Review of Systems Epistaxis, hemoptysis, hematuria, melena, or rectal bleeding denied.No unexplained weight loss, dysphagia, or abdominal pain reported. No persistently small caliber stools  There has been  no abnormal bruising or bleeding. There is no difficulty stopping bleeding with injury.     Objective:   Physical Exam  He gave the date as 09/27/2014. He was able to identify the president. He is having difficulty recalling the name of the pulmonologist he saw as well as the name of the  facility where he is residing @ this time There was evidence of incontinence of urine as his jeans were wet in the crotch area. Breath sounds are decreased; is no increased work of breathing He has a distant grade 1/2 systolic murmur Pedal pulses are present. He does have trace edema  General appearance :adequately nourished; in no distress. Eyes: No conjunctival inflammation or scleral icterus is present. Oral exam: Dental hygiene is good. Lips and gums are healthy appearing.There is no oropharyngeal erythema or exudate noted.  Heart:  Normal rate and regular rhythm. S1 and S2 normal without gallop, click, rub or other extra sounds   Lungs:Chest clear to auscultation; no wheezes, rhonchi,rales ,or rubs present. Abdomen: bowel sounds normal, soft and non-tender without masses, organomegaly or hernias noted.  No guarding or rebound.  Vascular : all pulses equal ; no bruits present. Skin:Warm & dry.  Intact without suspicious lesions or rashes ; no jaundice or tenting Lymphatic: No lymphadenopathy is  noted about the head, neck, axilla.           Assessment & Plan:  #1 subjective rectal bleeding, resolved  #2 memory deficit  #3 adult failure to thrive  Plan: I discussed with him my recommendation that he live a retirement facility. He has improved dramatically in reference to his overall status since he's been in such a facility.  PPD will be placed today and read 12/21.

## 2014-09-26 NOTE — Patient Instructions (Signed)
Please return 09/29/14 to have TB skin test read.

## 2014-09-29 ENCOUNTER — Telehealth: Payer: Self-pay

## 2014-09-29 LAB — TB SKIN TEST
Induration: 0 mm
TB Skin Test: NEGATIVE

## 2014-09-29 NOTE — Telephone Encounter (Signed)
-----   Message from Hendricks Limes, MD sent at 09/28/2014  8:10 AM EST ----- When he is brought in 12/21 to read TB skin test; please give facility personnel who brings him copy of 12/18 OV. Thanks

## 2014-09-30 ENCOUNTER — Telehealth: Payer: Self-pay | Admitting: Internal Medicine

## 2014-09-30 ENCOUNTER — Ambulatory Visit (INDEPENDENT_AMBULATORY_CARE_PROVIDER_SITE_OTHER): Payer: 59 | Admitting: Psychology

## 2014-09-30 DIAGNOSIS — F4323 Adjustment disorder with mixed anxiety and depressed mood: Secondary | ICD-10-CM

## 2014-09-30 NOTE — Telephone Encounter (Signed)
Son states FL2 was dropped off during [place] yesterday with [place].  Son states Mr. Tortorella is to go into assisted living tomorrow and needs faxed as soon as possible.  The FL2 needs to be faxed to Molli Barrows with [facility] [place] [phone]) to [phone].

## 2014-09-30 NOTE — Telephone Encounter (Signed)
Daniel Ali,  I am going to come see you on this one in the morning when I get in. Dr. Linna Darner asked me to fill out a charge sheet on this one and bring it back to him. I did this, but I never saw the completed documentation. Is it at his desk perhaps?

## 2014-10-01 NOTE — Telephone Encounter (Signed)
I handed you Mr Wardlow packet after he left from his nurse visit on Monday and that charge sheet was on top of the paper work.

## 2014-10-09 ENCOUNTER — Encounter: Payer: Self-pay | Admitting: Hematology and Oncology

## 2014-10-09 ENCOUNTER — Ambulatory Visit (HOSPITAL_BASED_OUTPATIENT_CLINIC_OR_DEPARTMENT_OTHER): Payer: Medicare Other | Admitting: Hematology and Oncology

## 2014-10-09 ENCOUNTER — Other Ambulatory Visit (HOSPITAL_BASED_OUTPATIENT_CLINIC_OR_DEPARTMENT_OTHER): Payer: Medicare Other

## 2014-10-09 ENCOUNTER — Telehealth: Payer: Self-pay | Admitting: Hematology and Oncology

## 2014-10-09 VITALS — BP 113/50 | HR 63 | Temp 98.2°F | Resp 18 | Ht 67.0 in | Wt 174.3 lb

## 2014-10-09 DIAGNOSIS — D471 Chronic myeloproliferative disease: Secondary | ICD-10-CM

## 2014-10-09 DIAGNOSIS — R197 Diarrhea, unspecified: Secondary | ICD-10-CM | POA: Insufficient documentation

## 2014-10-09 DIAGNOSIS — D649 Anemia, unspecified: Secondary | ICD-10-CM

## 2014-10-09 DIAGNOSIS — D630 Anemia in neoplastic disease: Secondary | ICD-10-CM

## 2014-10-09 DIAGNOSIS — C946 Myelodysplastic disease, not classified: Secondary | ICD-10-CM

## 2014-10-09 LAB — CBC WITH DIFFERENTIAL/PLATELET
BASO%: 0.6 % (ref 0.0–2.0)
BASOS ABS: 0 10*3/uL (ref 0.0–0.1)
EOS ABS: 0 10*3/uL (ref 0.0–0.5)
EOS%: 0.9 % (ref 0.0–7.0)
HCT: 30.4 % — ABNORMAL LOW (ref 38.4–49.9)
HGB: 9.9 g/dL — ABNORMAL LOW (ref 13.0–17.1)
LYMPH#: 1.2 10*3/uL (ref 0.9–3.3)
LYMPH%: 25.7 % (ref 14.0–49.0)
MCH: 36.3 pg — ABNORMAL HIGH (ref 27.2–33.4)
MCHC: 32.6 g/dL (ref 32.0–36.0)
MCV: 111.6 fL — AB (ref 79.3–98.0)
MONO#: 0.4 10*3/uL (ref 0.1–0.9)
MONO%: 8.7 % (ref 0.0–14.0)
NEUT#: 2.9 10*3/uL (ref 1.5–6.5)
NEUT%: 64.1 % (ref 39.0–75.0)
Platelets: 175 10*3/uL (ref 140–400)
RBC: 2.73 10*6/uL — ABNORMAL LOW (ref 4.20–5.82)
RDW: 19.1 % — AB (ref 11.0–14.6)
WBC: 4.5 10*3/uL (ref 4.0–10.3)

## 2014-10-09 NOTE — Progress Notes (Signed)
[facility] OFFICE PROGRESS NOTE  Patient Care Team: Hendricks Limes, MD as PCP - General  SUMMARY OF ONCOLOGIC HISTORY:  He was found to have abnormal CBC from recent CBC monitoring when he saw his primary care provider for recurrent falls. This patient had history disease, with angioplasty and stent placement in 2002 and 2005.  In May of this year, he fell at [employer]. After a detailed history, he stated that he had misjudged the distance between his hands and railing and fell. He did not sustain major injury but his CBC showed mild leukocytosis, anemia and very high platelet count, platelet count over 800,000. According to his son, the patient may have transient ischemic attack with mild weakness which subsequently resolved. A week ago, he had accident and had broken ribs. He had CT imaging study which confirmed retractors. Repeat CBC showed platelet count over 1 million. He is hence referred here. 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 The patient was started on hydroxyurea. On 05/16/2014, hydroxyurea is increased to 2 tablets a day On 06/09/2014, hydroxyurea dose is reduced back to one tablet per day.  on 09/09/2014, hydroxyurea is reduced to every other day. On 10/09/2014, hydroxyurea was placed on hold due to progressive anemia  INTERVAL HISTORY: Please see below for problem oriented charting. He is currently living in an assisted living. He complains of postprandial diarrhea. Recently, he has some rectal bleeding but has not have any recent bleeding since then. He denies recent infection.  REVIEW OF SYSTEMS:   Constitutional: Denies fevers, chills or abnormal weight loss Eyes: Denies blurriness of vision Ears, nose, mouth, throat, and face: Denies mucositis or sore throat Respiratory: Denies cough, dyspnea or wheezes Cardiovascular: Denies palpitation, chest discomfort or lower extremity swelling Gastrointestinal:  Denies nausea, heartburn or  change in bowel habits Skin: Denies abnormal skin rashes Lymphatics: Denies new lymphadenopathy or easy bruising Neurological:Denies numbness, tingling or new weaknesses Behavioral/Psych: Mood is stable, no new changes  All other systems were reviewed with the patient and are negative.  I have reviewed the past medical history, past surgical history, social history and family history with the patient and they are unchanged from previous note.  ALLERGIES:  is allergic to cefprozil and simvastatin.  MEDICATIONS:  Current Outpatient Prescriptions  Medication Sig Dispense Refill  . aspirin 81 MG tablet Take 81 mg by mouth daily.      . budesonide-formoterol (SYMBICORT) 160-4.5 MCG/ACT inhaler Inhale 2 puffs into the lungs 2 (two) times daily.    . feeding supplement, ENSURE COMPLETE, (ENSURE COMPLETE) LIQD Take 237 mLs by mouth 2 (two) times daily between meals.    . furosemide (LASIX) 40 MG tablet Take 40 mg by mouth.    . hydroxyurea (HYDREA) 500 MG capsule Take 1 capsule (500 mg total) by mouth daily. May take with food to minimize GI side effects. 30 capsule 3  . isosorbide dinitrate (ISORDIL) 30 MG tablet Take 30 mg by mouth daily.    . metoprolol succinate (TOPROL-XL) 25 MG 24 hr tablet Take 1 tablet (25 mg total) by mouth daily. (Patient taking differently: Take 25 mg by mouth 2 (two) times daily. )    . nitroGLYCERIN (NITROSTAT) 0.4 MG SL tablet Place 0.4 mg under the tongue every 5 (five) minutes as needed for chest pain.    . pantoprazole (PROTONIX) 40 MG tablet Take 1 tablet (40 mg total) by mouth daily. 30 tablet 2  . sertraline (ZOLOFT) 50 MG tablet Take 50 mg  by mouth daily.    . Multiple Minerals-Vitamins (CALCIUM & VIT D3 BONE HEALTH PO) Take by mouth daily.     . Multiple Vitamins-Minerals (ANTIOXIDANT) TABS Take by mouth daily.      . Omega-3 Fatty Acids (FISH OIL) 1000 MG CAPS Take by mouth daily.     No current facility-administered medications for this visit.     PHYSICAL EXAMINATION: ECOG PERFORMANCE STATUS: 0 - Asymptomatic  Filed Vitals:   10/09/14 1037  BP: 113/50  Pulse: 63  Temp: 98.2 F (36.8 C)  Resp: 18   Filed Weights   10/09/14 1037  Weight: 174 lb 4.8 oz (79.062 kg)    GENERAL:alert, no distress and comfortable SKIN: skin color is pale, texture, turgor are normal, no rashes or significant lesions EYES: normal, Conjunctiva are pale and non-injected, sclera clear Musculoskeletal:no cyanosis of digits and no clubbing  NEURO: alert & oriented x 3 with fluent speech, no focal motor/sensory deficits  LABORATORY DATA:  I have reviewed the data as listed    Component Value Date/Time   NA 138 08/10/2014 0515   NA 143 05/07/2014 1445   K 4.4 08/10/2014 0515   K 4.4 05/07/2014 1445   CL 104 08/10/2014 0515   CO2 26 08/10/2014 0515   CO2 28 05/07/2014 1445   GLUCOSE 99 08/10/2014 0515   GLUCOSE 102 05/07/2014 1445   BUN 20 08/10/2014 0515   BUN 21.6 05/07/2014 1445   CREATININE 1.10 08/10/2014 0515   CREATININE 1.2 05/07/2014 1445   CALCIUM 8.8 08/10/2014 0515   CALCIUM 8.6 05/07/2014 1445   PROT 5.7* 05/07/2014 1445   PROT 6.0 04/24/2014 1700   ALBUMIN 3.3* 05/07/2014 1445   ALBUMIN 3.5 04/24/2014 1700   AST 25 05/07/2014 1445   AST 33 04/24/2014 1700   ALT 18 05/07/2014 1445   ALT 32 04/24/2014 1700   ALKPHOS 103 05/07/2014 1445   ALKPHOS 117 04/24/2014 1700   BILITOT 0.43 05/07/2014 1445   BILITOT 0.5 04/24/2014 1700   GFRNONAA 58* 08/10/2014 0515   GFRAA 68* 08/10/2014 0515    No results found for: SPEP, UPEP  Lab Results  Component Value Date   WBC 4.5 10/09/2014   NEUTROABS 2.9 10/09/2014   HGB 9.9* 10/09/2014   HCT 30.4* 10/09/2014   MCV 111.6* 10/09/2014   PLT 175 10/09/2014      Chemistry      Component Value Date/Time   NA 138 08/10/2014 0515   NA 143 05/07/2014 1445   K 4.4 08/10/2014 0515   K 4.4 05/07/2014 1445   CL 104 08/10/2014 0515   CO2 26 08/10/2014 0515   CO2 28 05/07/2014  1445   BUN 20 08/10/2014 0515   BUN 21.6 05/07/2014 1445   CREATININE 1.10 08/10/2014 0515   CREATININE 1.2 05/07/2014 1445      Component Value Date/Time   CALCIUM 8.8 08/10/2014 0515   CALCIUM 8.6 05/07/2014 1445   ALKPHOS 103 05/07/2014 1445   ALKPHOS 117 04/24/2014 1700   AST 25 05/07/2014 1445   AST 33 04/24/2014 1700   ALT 18 05/07/2014 1445   ALT 32 04/24/2014 1700   BILITOT 0.43 05/07/2014 1445   BILITOT 0.5 04/24/2014 1700    ASSESSMENT & PLAN:  Myeloproliferative disease  He is not tolerating treatment well, especially with recent illness and hospitalization. I have given instruction to the nursing home to change hold Hydrea until his next visit.   Anemia in neoplastic disease  This is due to recent  illness and hydroxyurea. I recommend holding off Hydrea    Diarrhea I suspect there may be a component of lactose intolerance. Recommend holding off Ensure and dairy products.   No orders of the defined types were placed in this encounter.   All questions were answered. The patient knows to call the clinic with any problems, questions or concerns. No barriers to learning was detected. I spent 25 minutes counseling the patient face to face. The total time spent in the appointment was 25 minutes and more than 50% was on counseling and review of test results     [facility], [place], MD 10/09/2014 12:37 PM

## 2014-10-09 NOTE — Assessment & Plan Note (Signed)
This is due to recent illness and hydroxyurea. I recommend holding off Hydrea

## 2014-10-09 NOTE — Telephone Encounter (Signed)
gv adn rpitned appt sched and avs for pt for Jan 2016

## 2014-10-09 NOTE — Assessment & Plan Note (Signed)
I suspect there may be a component of lactose intolerance. Recommend holding off Ensure and dairy products.

## 2014-10-09 NOTE — Assessment & Plan Note (Signed)
He is not tolerating treatment well, especially with recent illness and hospitalization. I have given instruction to the nursing home to change hold Hydrea until his next visit.

## 2014-10-13 ENCOUNTER — Telehealth: Payer: Self-pay | Admitting: *Deleted

## 2014-10-13 NOTE — Telephone Encounter (Signed)
[place] Night - Client TELEPHONE ADVICE RECORD [facility] Patient Name: Daniel Ali Gender: Male DOB: 08-Apr-1925 Age: 81 Y 80 M 12 D Return Phone Number: Address: City/State/Zip: [place] Client [place] Night - Client Client Site [facility] - Night Physician [place], [place] Type Call Call Type Page Only Caller Name Zakia Relationship To Patient Provider Is this call to report lab results? No Return Phone Number Unavailable Initial Comment Caller states from [facility] caller [place]. states that patient is very nauseous requesting orders or rx cb# [phone] Nurse Assessment Guidelines Guideline Title Affirmed Question Affirmed Notes Nurse Date/Time Eilene Ghazi Time) Disp. Time Eilene Ghazi Time) Disposition Final User 10/10/2014 2:16:03 PM Send to Lares, [place] 10/10/2014 2:32:26 PM Paged On Call back to Call [place] 10/10/2014 2:32:43 PM Paged On Call back to Call Adel, [place] 10/10/2014 2:35:09 PM Page Completed Yes Byrd Hesselbach After Care Instructions Given Call Event Type User Date / Time Description Paging [facility] DoctorPhone DateTime Result/Outcome Notes Crissie Sickles [phone] 10/10/2014 2:32:26 PM Called On Call Provider - Left Message Crissie Sickles 10/10/2014 2:35:22 PM Spoke with On Call - General

## 2014-10-16 ENCOUNTER — Encounter: Payer: Self-pay | Admitting: Internal Medicine

## 2014-10-16 ENCOUNTER — Ambulatory Visit (INDEPENDENT_AMBULATORY_CARE_PROVIDER_SITE_OTHER): Payer: Medicare Other | Admitting: Internal Medicine

## 2014-10-16 DIAGNOSIS — J449 Chronic obstructive pulmonary disease, unspecified: Secondary | ICD-10-CM

## 2014-10-16 NOTE — Progress Notes (Signed)
Subjective:     Patient ID: Daniel Ali, male   DOB: 1925-02-15,    MRN: [id number]  Brief patient profile:  64 yowm quit smoking 1948 with GOLD I copd documented 08/10/12 referred by Dr Linna Darner for cough/sob indolent onset in Fall 2015     History of Present Illness  09/19/2014 1st [facility] office visit/ Daniel Ali   Chief Complaint  Patient presents with  . Advice Only    Pt referred by Dr. Linna Darner COPD-pt c/o sob with wheezing and cough. He has bloody mucus and chest tightness.   onset was indolent x sev months persistent daily since then assoc with sense of pnds and no improvement in cough or wheezing while maint on symbiocort chronically  And after rx with Zpack 09/01/14 not better rec Pantoprazole (protonix) 40 mg   Take 30-60 min before first meal of the day and Pepcid 20 mg one bedtime until return to office -  GERD  Diet    10/16/2014 f/u ov/Daniel Ali re: cough and sob ? Etiology only using symbicort 160 one bid / protonix daily  Chief Complaint  Patient presents with  . Follow-up    Pt states that his breathing is slightly better. He still c/o chest tightness.  His cough is unchanged.   can't name one activity where breathing slows him down or limits him  No obvious day to day or daytime variabilty or assoc excess or purulent sputum   Or subjective wheeze or overt  hb symptoms. No unusual exp hx or h/o childhood pna/ asthma or knowledge of premature birth.  Sleeping ok without nocturnal  or early am exacerbation  of respiratory  c/o's or need for noct saba. Also denies any obvious fluctuation of symptoms with weather or environmental changes or other aggravating or alleviating factors except as outlined above   Current Medications, Allergies, Complete Past Medical History, Past Surgical History, Family History, and Social History were reviewed in [employer] record.    ROS  The following are not active complaints unless bolded sore throat, dysphagia,  dental problems, itching, sneezing,  nasal congestion or excess/ purulent secretions, ear ache,   fever, chills, sweats, unintended wt loss, pleuritic or exertional cp, hemoptysis,  orthopnea pnd or leg swelling, presyncope, palpitations, heartburn, abdominal pain, anorexia, nausea, vomiting, diarrhea  or change in bowel or urinary habits, change in stools or urine, dysuria,hematuria,  rash, arthralgias, visual complaints, headache, numbness weakness or ataxia or problems with walking or coordination,  change in mood/affect or memory.         Objective:   Physical Exam    Hoarse amb wm nad    10/16/2014         177  Wt Readings from Last 3 Encounters:  09/19/14 177 lb (80.287 kg)  09/16/14 178 lb 6 oz (80.91 kg)  09/09/14 169 lb 8 oz (76.885 kg)    Vital signs reviewed  HEENT: nl dentition, turbinates, and orophanx. Nl external ear canals without cough reflex   NECK :  without JVD/Nodes/TM/ nl carotid upstrokes bilaterally   LUNGS: no acc muscle use,  Trace insp/ exp rhonchi bilaterally    CV:  RRR  no s3 or murmur or increase in P2, no edema   ABD:  soft and nontender with nl excursion in the supine position. No bruits or organomegaly, bowel sounds nl  MS:  warm without deformities, calf tenderness, cyanosis or clubbing  SKIN: warm and dry without lesions    NEURO:  alert,  approp, no deficits    cxr 09/01/14 Heart size and pulmonary vascularity are normal. There is minimal scarring at both lung bases. The lungs are otherwise clear. No effusions. No acute osseous abnormality.     Assessment:

## 2014-10-16 NOTE — Patient Instructions (Addendum)
symbicort 160 Take 2 puffs first thing in am and then another 2 puffs about 12 hours later.    Please schedule a follow up office visit in 4-6 weeks, sooner if needed with pfts on return

## 2014-10-17 ENCOUNTER — Ambulatory Visit: Payer: Self-pay | Admitting: Internal Medicine

## 2014-10-19 ENCOUNTER — Encounter: Payer: Self-pay | Admitting: Internal Medicine

## 2014-10-19 NOTE — Assessment & Plan Note (Addendum)
08/10/12  FEV1  1.70 (85%) . FEV1/FVC ratio 53% pre-and 55% post bronchodilator.  Normal diffusing capacity. Smoked occasional cigar only in college; quit 1948  - 09/19/14  Walked RA x 3 laps @ 185 ft each stopped due to leg pain, no sob, no desat    Symptoms are markedly disproportionate to objective findings and not clear this is a lung problem but pt does appear to have difficult airway management issues. DDX of  difficult airways management all start with A and  include Adherence, Ace Inhibitors, Acid Reflux, Active Sinus Disease, Alpha 1 Antitripsin deficiency, Anxiety masquerading as Airways dz,  ABPA,  allergy(esp in young), Aspiration (esp in elderly), Adverse effects of DPI,  Active smokers, Anemia,  plus two Bs  = Bronchiectasis and Beta blocker use..and one C= CHF  Adherence is always the initial "prime suspect" and is a multilayered concern that requires a "trust but verify" approach in every patient - starting with knowing how to use medications, especially inhalers, correctly, keeping up with refills and understanding the fundamental difference between maintenance and prns vs those medications only taken for a very short course and then stopped and not refilled.  - The proper method of use, as well as anticipated side effects, of a metered-dose inhaler are discussed and demonstrated to the patient. Improved effectiveness after extensive coaching during this visit to a level of approximately  75% > continue symbicort but use the 2 bid dosing instead of one bid  ? Acid (or non-acid) GERD > always difficult to exclude as up to 75% of pts in some series report no assoc GI/ Heartburn symptoms> rec continue max (24h)  acid suppression and diet restrictions/ reviewed     ? Aspiration > consider dg esophagram  Anemia Lab Results  Component Value Date   HGB 9.9* 10/09/2014   HGB 10.6* 09/09/2014   HGB 9.6* 08/10/2014   definitely may be contributing to symptoms  ? chf > note echo nl  08/09/2014 during the time he was sob so doubt strongly

## 2014-10-23 ENCOUNTER — Telehealth: Payer: Self-pay | Admitting: Hematology and Oncology

## 2014-10-23 NOTE — Telephone Encounter (Signed)
pt living facility called to r/s due to no transportation on Fridays...done....they are aware of new d.t

## 2014-10-28 ENCOUNTER — Ambulatory Visit (INDEPENDENT_AMBULATORY_CARE_PROVIDER_SITE_OTHER): Payer: 59 | Admitting: Psychology

## 2014-10-28 DIAGNOSIS — F4323 Adjustment disorder with mixed anxiety and depressed mood: Secondary | ICD-10-CM

## 2014-11-07 ENCOUNTER — Other Ambulatory Visit: Payer: Self-pay

## 2014-11-07 ENCOUNTER — Ambulatory Visit: Payer: Self-pay | Admitting: Hematology and Oncology

## 2014-11-07 ENCOUNTER — Telehealth: Payer: Self-pay | Admitting: *Deleted

## 2014-11-07 NOTE — Telephone Encounter (Signed)
err

## 2014-11-07 NOTE — Telephone Encounter (Signed)
Staff from [facility] called to receive an order to hold Hydrea until 11/10/14. Dr. Alvy Bimler notified. No order obtain and patient will be seen 11/10/14.

## 2014-11-10 ENCOUNTER — Telehealth: Payer: Self-pay | Admitting: Hematology and Oncology

## 2014-11-10 ENCOUNTER — Encounter: Payer: Self-pay | Admitting: Hematology and Oncology

## 2014-11-10 ENCOUNTER — Other Ambulatory Visit (HOSPITAL_BASED_OUTPATIENT_CLINIC_OR_DEPARTMENT_OTHER): Payer: Medicare Other

## 2014-11-10 ENCOUNTER — Ambulatory Visit (HOSPITAL_BASED_OUTPATIENT_CLINIC_OR_DEPARTMENT_OTHER): Payer: Medicare Other | Admitting: Hematology and Oncology

## 2014-11-10 DIAGNOSIS — D630 Anemia in neoplastic disease: Secondary | ICD-10-CM

## 2014-11-10 DIAGNOSIS — D471 Chronic myeloproliferative disease: Secondary | ICD-10-CM

## 2014-11-10 DIAGNOSIS — D649 Anemia, unspecified: Secondary | ICD-10-CM

## 2014-11-10 LAB — CBC WITH DIFFERENTIAL/PLATELET
BASO%: 0.4 % (ref 0.0–2.0)
BASOS ABS: 0 10*3/uL (ref 0.0–0.1)
EOS ABS: 0 10*3/uL (ref 0.0–0.5)
EOS%: 0 % (ref 0.0–7.0)
HCT: 35.2 % — ABNORMAL LOW (ref 38.4–49.9)
HGB: 11.3 g/dL — ABNORMAL LOW (ref 13.0–17.1)
LYMPH#: 1 10*3/uL (ref 0.9–3.3)
LYMPH%: 11.5 % — AB (ref 14.0–49.0)
MCH: 35.6 pg — ABNORMAL HIGH (ref 27.2–33.4)
MCHC: 32.1 g/dL (ref 32.0–36.0)
MCV: 111.1 fL — AB (ref 79.3–98.0)
MONO#: 0.4 10*3/uL (ref 0.1–0.9)
MONO%: 4.5 % (ref 0.0–14.0)
NEUT#: 7.2 10*3/uL — ABNORMAL HIGH (ref 1.5–6.5)
NEUT%: 83.6 % — ABNORMAL HIGH (ref 39.0–75.0)
Platelets: 414 10*3/uL — ABNORMAL HIGH (ref 140–400)
RBC: 3.17 10*6/uL — ABNORMAL LOW (ref 4.20–5.82)
RDW: 16.4 % — ABNORMAL HIGH (ref 11.0–14.6)
WBC: 8.6 10*3/uL (ref 4.0–10.3)

## 2014-11-10 MED ORDER — HYDROXYUREA 500 MG PO CAPS: 500.0000 mg | ORAL_CAPSULE | Freq: Every day | ORAL | Status: DC

## 2014-11-10 NOTE — Assessment & Plan Note (Signed)
He is not tolerating treatment well, especially with recent illness and hospitalization. I have given instruction to the nursing home to change hold Hydrea until his next visit. It is not clear to me whether this is happening. Nevertheless, his blood count has improved. I will restart hydroxyurea at 500 mg daily except on the weekends. With see him back in a month with repeat blood work history and physical examination

## 2014-11-10 NOTE — Progress Notes (Signed)
[facility] [place] OFFICE PROGRESS NOTE  Patient Care Team: Hendricks Limes, MD as PCP - General  SUMMARY OF ONCOLOGIC HISTORY:  He was found to have abnormal CBC from recent CBC monitoring when he saw his primary care provider for recurrent falls. This patient had history disease, with angioplasty and stent placement in 2002 and 2005.  In May of this year, he fell at [employer]. After a detailed history, he stated that he had misjudged the distance between his hands and railing and fell. He did not sustain major injury but his CBC showed mild leukocytosis, anemia and very high platelet count, platelet count over 800,000. According to his son, the patient may have transient ischemic attack with mild weakness which subsequently resolved. A week ago, he had accident and had broken ribs. He had CT imaging study which confirmed retractors. Repeat CBC showed platelet count over 1 million. He is hence referred here. 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 The patient was started on hydroxyurea. On 05/16/2014, hydroxyurea is increased to 2 tablets a day On 06/09/2014, hydroxyurea dose is reduced back to one tablet per day.  on 09/09/2014, hydroxyurea is reduced to every other day. On 10/09/2014, hydroxyurea was placed on hold due to progressive anemia  INTERVAL HISTORY: Please see below for problem oriented charting. He feels better. Denies any recent illness. REVIEW OF SYSTEMS:   Constitutional: Denies fevers, chills or abnormal weight loss Eyes: Denies blurriness of vision Ears, nose, mouth, throat, and face: Denies mucositis or sore throat Respiratory: Denies cough, dyspnea or wheezes Cardiovascular: Denies palpitation, chest discomfort or lower extremity swelling Gastrointestinal:  Denies nausea, heartburn or change in bowel habits Skin: Denies abnormal skin rashes Lymphatics: Denies new lymphadenopathy or easy bruising Neurological:Denies numbness, tingling or new  weaknesses Behavioral/Psych: Mood is stable, no new changes  All other systems were reviewed with the patient and are negative.  I have reviewed the past medical history, past surgical history, social history and family history with the patient and they are unchanged from previous note.  ALLERGIES:  is allergic to cefprozil and simvastatin.  MEDICATIONS:  Current Outpatient Prescriptions  Medication Sig Dispense Refill  . aspirin 81 MG tablet Take 81 mg by mouth daily.      . budesonide-formoterol (SYMBICORT) 160-4.5 MCG/ACT inhaler Inhale 2 puffs into the lungs 2 (two) times daily.    . furosemide (LASIX) 40 MG tablet Take 40 mg by mouth.    . isosorbide dinitrate (ISORDIL) 30 MG tablet Take 30 mg by mouth daily.    . metoprolol succinate (TOPROL-XL) 25 MG 24 hr tablet Take 1 tablet (25 mg total) by mouth daily. (Patient taking differently: Take 25 mg by mouth 2 (two) times daily. )    . Multiple Vitamins-Minerals (ADULT GUMMY PO) Take 1 tablet by mouth daily.    . nitroGLYCERIN (NITROSTAT) 0.4 MG SL tablet Place 0.4 mg under the tongue every 5 (five) minutes as needed for chest pain.    . pantoprazole (PROTONIX) 40 MG tablet Take 1 tablet (40 mg total) by mouth daily. 30 tablet 2  . sertraline (ZOLOFT) 50 MG tablet Take 50 mg by mouth daily.    . hydroxyurea (HYDREA) 500 MG capsule Take 1 capsule (500 mg total) by mouth daily. May take with food to minimize GI side effects. Take 1 pill daily except Saturdays and Sundays. 20 capsule 0   No current facility-administered medications for this visit.    PHYSICAL EXAMINATION: ECOG PERFORMANCE STATUS: 0 - Asymptomatic  GENERAL:alert, no distress and comfortable SKIN: skin color, texture, turgor are normal, no rashes or significant lesions EYES: normal, Conjunctiva are pink and non-injected, sclera clear Musculoskeletal:no cyanosis of digits and no clubbing  NEURO: alert & oriented x 3 with fluent speech, no focal motor/sensory  deficits  LABORATORY DATA:  I have reviewed the data as listed    Component Value Date/Time   NA 138 08/10/2014 0515   NA 143 05/07/2014 1445   K 4.4 08/10/2014 0515   K 4.4 05/07/2014 1445   CL 104 08/10/2014 0515   CO2 26 08/10/2014 0515   CO2 28 05/07/2014 1445   GLUCOSE 99 08/10/2014 0515   GLUCOSE 102 05/07/2014 1445   BUN 20 08/10/2014 0515   BUN 21.6 05/07/2014 1445   CREATININE 1.10 08/10/2014 0515   CREATININE 1.2 05/07/2014 1445   CALCIUM 8.8 08/10/2014 0515   CALCIUM 8.6 05/07/2014 1445   PROT 5.7* 05/07/2014 1445   PROT 6.0 04/24/2014 1700   ALBUMIN 3.3* 05/07/2014 1445   ALBUMIN 3.5 04/24/2014 1700   AST 25 05/07/2014 1445   AST 33 04/24/2014 1700   ALT 18 05/07/2014 1445   ALT 32 04/24/2014 1700   ALKPHOS 103 05/07/2014 1445   ALKPHOS 117 04/24/2014 1700   BILITOT 0.43 05/07/2014 1445   BILITOT 0.5 04/24/2014 1700   GFRNONAA 58* 08/10/2014 0515   GFRAA 68* 08/10/2014 0515    No results found for: SPEP, UPEP  Lab Results  Component Value Date   WBC 8.6 11/10/2014   NEUTROABS 7.2* 11/10/2014   HGB 11.3* 11/10/2014   HCT 35.2* 11/10/2014   MCV 111.1* 11/10/2014   PLT 414* 11/10/2014      Chemistry      Component Value Date/Time   NA 138 08/10/2014 0515   NA 143 05/07/2014 1445   K 4.4 08/10/2014 0515   K 4.4 05/07/2014 1445   CL 104 08/10/2014 0515   CO2 26 08/10/2014 0515   CO2 28 05/07/2014 1445   BUN 20 08/10/2014 0515   BUN 21.6 05/07/2014 1445   CREATININE 1.10 08/10/2014 0515   CREATININE 1.2 05/07/2014 1445      Component Value Date/Time   CALCIUM 8.8 08/10/2014 0515   CALCIUM 8.6 05/07/2014 1445   ALKPHOS 103 05/07/2014 1445   ALKPHOS 117 04/24/2014 1700   AST 25 05/07/2014 1445   AST 33 04/24/2014 1700   ALT 18 05/07/2014 1445   ALT 32 04/24/2014 1700   BILITOT 0.43 05/07/2014 1445   BILITOT 0.5 04/24/2014 1700     ASSESSMENT & PLAN:  Myeloproliferative disease  He is not tolerating treatment well, especially with  recent illness and hospitalization. I have given instruction to the nursing home to change hold Hydrea until his next visit. It is not clear to me whether this is happening. Nevertheless, his blood count has improved. I will restart hydroxyurea at 500 mg daily except on the weekends. With see him back in a month with repeat blood work history and physical examination      Anemia in neoplastic disease  This is due to recent illness and hydroxyurea. I will resume Hydrea at reduced dose by skipping the weekends.      No orders of the defined types were placed in this encounter.   All questions were answered. The patient knows to call the clinic with any problems, questions or concerns. No barriers to learning was detected. I spent 15 minutes counseling the patient face to face. The total time spent  in the appointment was 20 minutes and more than 50% was on counseling and review of test results     [facility], [place], MD 11/10/2014 3:22 PM

## 2014-11-10 NOTE — Telephone Encounter (Signed)
Gave avs & calendar March.

## 2014-11-10 NOTE — Assessment & Plan Note (Signed)
This is due to recent illness and hydroxyurea. I will resume Hydrea at reduced dose by skipping the weekends.

## 2014-11-11 ENCOUNTER — Ambulatory Visit: Payer: 59 | Admitting: Psychology

## 2014-11-24 ENCOUNTER — Encounter: Payer: Self-pay | Admitting: Internal Medicine

## 2014-11-24 ENCOUNTER — Ambulatory Visit (INDEPENDENT_AMBULATORY_CARE_PROVIDER_SITE_OTHER): Payer: Medicare Other | Admitting: Internal Medicine

## 2014-11-24 VITALS — BP 118/58 | HR 82 | Temp 97.6°F | Ht 68.0 in | Wt 177.0 lb

## 2014-11-24 DIAGNOSIS — J449 Chronic obstructive pulmonary disease, unspecified: Secondary | ICD-10-CM

## 2014-11-24 LAB — PULMONARY FUNCTION TEST
DL/VA % pred: 73 %
DL/VA: 3.27 ml/min/mmHg/L
DLCO UNC % PRED: 54 %
DLCO unc: 16.19 ml/min/mmHg
FEF 25-75 POST: 0.7 L/s
FEF 25-75 Pre: 0.79 L/sec
FEF2575-%CHANGE-POST: -11 %
FEF2575-%Pred-Post: 49 %
FEF2575-%Pred-Pre: 55 %
FEV1-%Change-Post: -2 %
FEV1-%PRED-POST: 69 %
FEV1-%Pred-Pre: 71 %
FEV1-Post: 1.6 L
FEV1-Pre: 1.64 L
FEV1FVC-%CHANGE-POST: -1 %
FEV1FVC-%Pred-Pre: 89 %
FEV6-%Change-Post: -1 %
FEV6-%PRED-PRE: 82 %
FEV6-%Pred-Post: 81 %
FEV6-POST: 2.52 L
FEV6-PRE: 2.55 L
FEV6FVC-%Change-Post: 0 %
FEV6FVC-%PRED-PRE: 106 %
FEV6FVC-%Pred-Post: 105 %
FVC-%CHANGE-POST: 0 %
FVC-%Pred-Post: 77 %
FVC-%Pred-Pre: 77 %
FVC-Post: 2.59 L
FVC-Pre: 2.61 L
POST FEV6/FVC RATIO: 97 %
Post FEV1/FVC ratio: 62 %
Pre FEV1/FVC ratio: 63 %
Pre FEV6/FVC Ratio: 98 %
RV % pred: 129 %
RV: 3.5 L
TLC % pred: 91 %
TLC: 6.11 L

## 2014-11-24 NOTE — Progress Notes (Signed)
PFT done today. 

## 2014-11-24 NOTE — Patient Instructions (Addendum)
Continue symbicort 160 Take 2 puffs first thing in am and then another 2 puffs about 12 hours later.   Work on inhaler technique:  relax and gently blow all the way out then take a nice smooth deep breath back in, triggering the inhaler at same time you start breathing in.  Hold for up to 5 seconds if you can.  Rinse and gargle with water when done  Return here anytime if you feel your breathing is limiting you from doing what you want to do

## 2014-11-24 NOTE — Progress Notes (Signed)
Subjective:     Patient ID: Daniel Ali, male   DOB: 04/28/1925     MRN: [id number]  Brief patient profile:  60 yowm quit smoking 1948 with GOLD I copd documented 08/10/12 referred by Dr Linna Darner for cough/sob indolent onset in Fall 2015 and dx as GOLD II in pulmonary clini 11/24/14     History of Present Illness  09/19/2014 1st [facility] office visit/ [facility]   Chief Complaint  Patient presents with  . Advice Only    Pt referred by Dr. Linna Darner COPD-pt c/o sob with wheezing and cough. He has bloody mucus and chest tightness.   onset was indolent x sev months persistent daily since then assoc with sense of pnds and no improvement in cough or wheezing while maint on symbiocort chronically  And after rx with Zpack 09/01/14 not better rec Pantoprazole (protonix) 40 mg   Take 30-60 min before first meal of the day and Pepcid 20 mg one bedtime until return to office -  GERD  Diet    10/16/2014 f/u ov/[facility] re: cough and sob ? Etiology only using symbicort 160 one bid / protonix daily  Chief Complaint  Patient presents with  . Follow-up    Pt states that his breathing is slightly better. He still c/o chest tightness.  His cough is unchanged.   can't name one activity where breathing slows him down or limits him rec symbicort 160 Take 2 puffs first thing in am and then another 2 puffs about 12 hours later.   11/24/2014 f/u ov/Wert re: GOLD II copd  Chief Complaint  Patient presents with  . Follow-up    SOB at baseline; prod cough w/yellow mucus; no f/n/v/d   Not limited by breathing from desired activities, feels symbicort is helping despite suboptimal hfa technique Some am cough / congestion but easily clears it, worse in winter   No obvious day to day or daytime variabilty or assoc cp   Or subjective wheeze or overt  hb symptoms. No unusual exp hx or h/o childhood pna/ asthma or knowledge of premature birth.  Sleeping ok without nocturnal  or early am exacerbation  of respiratory   c/o's or need for noct saba. Also denies any obvious fluctuation of symptoms with weather or environmental changes or other aggravating or alleviating factors except as outlined above   Current Medications, Allergies, Complete Past Medical History, Past Surgical History, Family History, and Social History were reviewed in [employer] record.    ROS  The following are not active complaints unless bolded sore throat, dysphagia, dental problems, itching, sneezing,  nasal congestion or excess/ purulent secretions, ear ache,   fever, chills, sweats, unintended wt loss, pleuritic or exertional cp, hemoptysis,  orthopnea pnd or leg swelling, presyncope, palpitations, heartburn, abdominal pain, anorexia, nausea, vomiting, diarrhea  or change in bowel or urinary habits, change in stools or urine, dysuria,hematuria,  rash, arthralgias, visual complaints, headache, numbness weakness or ataxia or problems with walking or coordination,  change in mood/affect or memory.         Objective:   Physical Exam    Hoarse amb wm nad    10/16/2014         177 > 11/24/2014  177 Wt Readings from Last 3 Encounters:  09/19/14 177 lb (80.287 kg)  09/16/14 178 lb 6 oz (80.91 kg)  09/09/14 169 lb 8 oz (76.885 kg)    Vital signs reviewed  HEENT: nl dentition, turbinates, and orophanx. Nl external ear  canals without cough reflex   NECK :  without JVD/Nodes/TM/ nl carotid upstrokes bilaterally   LUNGS: no acc muscle use,  Trace insp/ exp rhonchi bilaterally    CV:  RRR  no s3 or murmur or increase in P2, no edema   ABD:  soft and nontender with nl excursion in the supine position. No bruits or organomegaly, bowel sounds nl  MS:  warm without deformities, calf tenderness, cyanosis or clubbing  SKIN: warm and dry without lesions    NEURO:  alert, approp, no deficits    cxr 09/01/14 Heart size and pulmonary vascularity are normal. There is minimal scarring at both lung bases. The lungs  are otherwise clear. No effusions. No acute osseous abnormality.     Assessment:

## 2014-11-25 ENCOUNTER — Encounter: Payer: Self-pay | Admitting: Internal Medicine

## 2014-11-25 NOTE — Assessment & Plan Note (Addendum)
08/10/12  FEV1  1.70 (85%) . FEV1/FVC ratio 53% pre-and 55% post bronchodilator.  Normal diffusing capacity. 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 Improved effectiveness after extensive coaching during this visit to a level of approximately  75% from a baseline of 50%   I had an extended discussion with the patient reviewing all relevant studies completed to date and  lasting 15 to 20 minutes of a 25 minute visit on the following ongoing concerns:  He is barely a GOLD II and has benefited from symbicort which he should continue   Since not limited by sob, albeit with a sedentary lifestyle, and no tendency to aecopd ok to return here prn     Each maintenance medication was reviewed in detail including most importantly the difference between maintenance and as needed and under what circumstances the prns are to be used.  Please see instructions for details which were reviewed in writing and the patient given a copy.

## 2014-12-02 ENCOUNTER — Ambulatory Visit (INDEPENDENT_AMBULATORY_CARE_PROVIDER_SITE_OTHER): Payer: 59 | Admitting: Psychology

## 2014-12-02 DIAGNOSIS — F339 Major depressive disorder, recurrent, unspecified: Secondary | ICD-10-CM

## 2014-12-08 ENCOUNTER — Emergency Department (HOSPITAL_COMMUNITY): Payer: Medicare Other

## 2014-12-08 ENCOUNTER — Emergency Department (HOSPITAL_COMMUNITY)
Admission: EM | Admit: 2014-12-08 | Discharge: 2014-12-08 | Disposition: A | Discharge status: Home / Self Care | Payer: Medicare Other | Attending: Emergency Medicine | Admitting: Emergency Medicine

## 2014-12-08 ENCOUNTER — Encounter: Payer: Self-pay | Admitting: Internal Medicine

## 2014-12-08 ENCOUNTER — Encounter: Payer: Self-pay | Admitting: Hematology and Oncology

## 2014-12-08 ENCOUNTER — Encounter (HOSPITAL_COMMUNITY): Payer: Self-pay | Admitting: Nurse Practitioner

## 2014-12-08 DIAGNOSIS — Z7951 Long term (current) use of inhaled steroids: Secondary | ICD-10-CM | POA: Diagnosis not present

## 2014-12-08 DIAGNOSIS — Z862 Personal history of diseases of the blood and blood-forming organs and certain disorders involving the immune mechanism: Secondary | ICD-10-CM | POA: Insufficient documentation

## 2014-12-08 DIAGNOSIS — Z8719 Personal history of other diseases of the digestive system: Secondary | ICD-10-CM | POA: Insufficient documentation

## 2014-12-08 DIAGNOSIS — Z79899 Other long term (current) drug therapy: Secondary | ICD-10-CM | POA: Diagnosis not present

## 2014-12-08 DIAGNOSIS — Z8639 Personal history of other endocrine, nutritional and metabolic disease: Secondary | ICD-10-CM | POA: Insufficient documentation

## 2014-12-08 DIAGNOSIS — Z8659 Personal history of other mental and behavioral disorders: Secondary | ICD-10-CM | POA: Insufficient documentation

## 2014-12-08 DIAGNOSIS — Z9861 Coronary angioplasty status: Secondary | ICD-10-CM | POA: Diagnosis not present

## 2014-12-08 DIAGNOSIS — I10 Essential (primary) hypertension: Secondary | ICD-10-CM | POA: Insufficient documentation

## 2014-12-08 DIAGNOSIS — R0602 Shortness of breath: Secondary | ICD-10-CM | POA: Diagnosis present

## 2014-12-08 DIAGNOSIS — J441 Chronic obstructive pulmonary disease with (acute) exacerbation: Secondary | ICD-10-CM | POA: Diagnosis not present

## 2014-12-08 DIAGNOSIS — Z86010 Personal history of colonic polyps: Secondary | ICD-10-CM | POA: Diagnosis not present

## 2014-12-08 DIAGNOSIS — I2510 Atherosclerotic heart disease of native coronary artery without angina pectoris: Secondary | ICD-10-CM | POA: Insufficient documentation

## 2014-12-08 DIAGNOSIS — Z87891 Personal history of nicotine dependence: Secondary | ICD-10-CM | POA: Diagnosis not present

## 2014-12-08 DIAGNOSIS — Z7982 Long term (current) use of aspirin: Secondary | ICD-10-CM | POA: Insufficient documentation

## 2014-12-08 DIAGNOSIS — Z8669 Personal history of other diseases of the nervous system and sense organs: Secondary | ICD-10-CM | POA: Diagnosis not present

## 2014-12-08 DIAGNOSIS — Z8711 Personal history of peptic ulcer disease: Secondary | ICD-10-CM | POA: Diagnosis not present

## 2014-12-08 LAB — CBC WITH DIFFERENTIAL/PLATELET
Basophils Absolute: 0 10*3/uL (ref 0.0–0.1)
Basophils Relative: 0 % (ref 0–1)
EOS PCT: 3 % (ref 0–5)
Eosinophils Absolute: 0.1 10*3/uL (ref 0.0–0.7)
HCT: 31.8 % — ABNORMAL LOW (ref 39.0–52.0)
Hemoglobin: 10.3 g/dL — ABNORMAL LOW (ref 13.0–17.0)
LYMPHS ABS: 1.5 10*3/uL (ref 0.7–4.0)
Lymphocytes Relative: 31 % (ref 12–46)
MCH: 34.7 pg — ABNORMAL HIGH (ref 26.0–34.0)
MCHC: 32.4 g/dL (ref 30.0–36.0)
MCV: 107.1 fL — AB (ref 78.0–100.0)
Monocytes Absolute: 0.6 10*3/uL (ref 0.1–1.0)
Monocytes Relative: 12 % (ref 3–12)
NEUTROS ABS: 2.6 10*3/uL (ref 1.7–7.7)
Neutrophils Relative %: 54 % (ref 43–77)
PLATELETS: 288 10*3/uL (ref 150–400)
RBC: 2.97 MIL/uL — AB (ref 4.22–5.81)
RDW: 16.4 % — ABNORMAL HIGH (ref 11.5–15.5)
WBC: 4.8 10*3/uL (ref 4.0–10.5)

## 2014-12-08 LAB — COMPREHENSIVE METABOLIC PANEL
ALBUMIN: 4.1 g/dL (ref 3.5–5.2)
ALT: 21 U/L (ref 0–53)
ANION GAP: 9 (ref 5–15)
AST: 26 U/L (ref 0–37)
Alkaline Phosphatase: 80 U/L (ref 39–117)
BUN: 26 mg/dL — AB (ref 6–23)
CHLORIDE: 99 mmol/L (ref 96–112)
CO2: 27 mmol/L (ref 19–32)
CREATININE: 1.22 mg/dL (ref 0.50–1.35)
Calcium: 8.5 mg/dL (ref 8.4–10.5)
GFR calc Af Amer: 60 mL/min — ABNORMAL LOW (ref >90)
GFR, EST NON AFRICAN AMERICAN: 51 mL/min — AB (ref >90)
Glucose, Bld: 113 mg/dL — ABNORMAL HIGH (ref 70–99)
Potassium: 3.9 mmol/L (ref 3.5–5.1)
Sodium: 135 mmol/L (ref 135–145)
Total Bilirubin: 0.7 mg/dL (ref 0.3–1.2)
Total Protein: 6.4 g/dL (ref 6.0–8.3)

## 2014-12-08 LAB — BRAIN NATRIURETIC PEPTIDE: B Natriuretic Peptide: 119.1 pg/mL — ABNORMAL HIGH (ref 0.0–100.0)

## 2014-12-08 LAB — TROPONIN I: Troponin I: <0.03 ng/mL (ref <0.031)

## 2014-12-08 MED ORDER — IPRATROPIUM BROMIDE 0.02 % IN SOLN
0.5000 mg | Freq: Once | RESPIRATORY_TRACT | Status: AC
  Administered 2014-12-08: 0.5 mg via RESPIRATORY_TRACT
  Filled 2014-12-08: qty 2.5

## 2014-12-08 MED ORDER — ALBUTEROL SULFATE HFA 108 (90 BASE) MCG/ACT IN AERS
2.0000 | INHALATION_SPRAY | RESPIRATORY_TRACT | Status: DC
  Administered 2014-12-08: 2 via RESPIRATORY_TRACT
  Filled 2014-12-08: qty 6.7

## 2014-12-08 MED ORDER — PREDNISONE 20 MG PO TABS
60.0000 mg | ORAL_TABLET | Freq: Once | ORAL | Status: AC
  Administered 2014-12-08: 60 mg via ORAL
  Filled 2014-12-08: qty 3

## 2014-12-08 MED ORDER — PREDNISONE 10 MG PO TABS: 60.0000 mg | ORAL_TABLET | Freq: Every day | ORAL | Status: DC

## 2014-12-08 MED ORDER — ALBUTEROL SULFATE (2.5 MG/3ML) 0.083% IN NEBU
5.0000 mg | INHALATION_SOLUTION | Freq: Once | RESPIRATORY_TRACT | Status: AC
  Administered 2014-12-08: 5 mg via RESPIRATORY_TRACT
  Filled 2014-12-08: qty 6

## 2014-12-08 NOTE — ED Notes (Signed)
Pt presents from [facility], c/o shortness of breath and cough, recently pneumonia per bedside report, EMS vitals unremarkable, pt AOx4, denies chest pain, no apparent distress, sent further evaluation.

## 2014-12-08 NOTE — ED Provider Notes (Signed)
CSN: [id number]     Arrival date & time 12/08/14  1928 History   First MD Initiated Contact with Patient 12/08/14 1929     Chief Complaint  Patient presents with  . Shortness of Breath     HPI Patient reports history reactive airway disease.  He states he was started on antibiotics several days ago for possible pneumonia at the assisted living center.  He presents today because of ongoing cough with some associated shortness of breath.  He denies unilateral leg swelling.  No history DVT or pulmonary embolism.  No anterior chest pain.  Reports the cough is somewhat productive.  He does see a pulmonologist and is on Symbicort.  He has tried this without improvement in his symptoms.   Past Medical History  Diagnosis Date  . Loss of height   . Compression fracture   . Hypertension   . Hypercholesterolemia   . Rhinitis   . Dyspnea   . Weight loss   . CAD (coronary artery disease)   . Peptic ulcer disease   . Alcohol abuse   . AMI (acute mesenteric ischemia)   . Colon polyps   . Pleurisy with effusion   . Grief reaction   . Macular degeneration   . Leukocytosis, unspecified 05/07/2014  . Bilateral leg edema 05/07/2014  . Recurrent falls 05/07/2014   Past Surgical History  Procedure Laterality Date  . Exploratory laparotomy  1978    due to kidney problems  . Thoracotomy  1982    hemothorax  . Back surgery  2003    Cervical Spondylosis  . Spine surgery  2003    C-spine  . Cardiac catheterization  06/21/03  . Coronary stent placement  07/2003    x3  . Coronary angioplasty with stent placement  05/2004  . Colonoscopy w/ polypectomy  11/2006   Family History  Problem Relation Age of Onset  . Diabetes Father   . Colon cancer Mother   . Colon cancer Son    History  Substance Use Topics  . Smoking status: Former Smoker    Quit date: 10/10/1946  . Smokeless tobacco: Never Used     Comment: cigars during college  . Alcohol Use: No     Comment: recovered ETOH abuser     Review of Systems  All other systems reviewed and are negative.     Allergies  Cefprozil and Simvastatin  Home Medications   Prior to Admission medications   Medication Sig Start Date End Date Taking? Authorizing Provider  aspirin 81 MG tablet Take 81 mg by mouth daily.      Historical Provider, MD  budesonide-formoterol (SYMBICORT) 160-4.5 MCG/ACT inhaler Inhale 2 puffs into the lungs 2 (two) times daily.    Historical Provider, MD  furosemide (LASIX) 40 MG tablet Take 40 mg by mouth.    Historical Provider, MD  hydroxyurea (HYDREA) 500 MG capsule Take 1 capsule (500 mg total) by mouth daily. May take with food to minimize GI side effects. Take 1 pill daily except Saturdays and Sundays. 11/10/14   Heath Lark, MD  isosorbide dinitrate (ISORDIL) 30 MG tablet Take 30 mg by mouth daily. 09/03/14   Historical Provider, MD  metoprolol succinate (TOPROL-XL) 25 MG 24 hr tablet Take 1 tablet (25 mg total) by mouth daily. Patient taking differently: Take 25 mg by mouth 2 (two) times daily.  08/11/14   Geradine Girt, DO  Multiple Vitamins-Minerals (ADULT GUMMY PO) Take 1 tablet by mouth daily.  Historical Provider, MD  nitroGLYCERIN (NITROSTAT) 0.4 MG SL tablet Place 0.4 mg under the tongue every 5 (five) minutes as needed for chest pain.    Historical Provider, MD  pantoprazole (PROTONIX) 40 MG tablet Take 1 tablet (40 mg total) by mouth daily. 09/19/14   Tanda Rockers, MD  sertraline (ZOLOFT) 50 MG tablet Take 50 mg by mouth daily.    Historical Provider, MD   BP 117/68 mmHg  Pulse 70  Temp(Src) 97.9 F (36.6 C) (Oral)  Resp 20  SpO2 95% Physical Exam  Constitutional: He is oriented to person, place, and time. He appears well-developed and well-nourished.  HENT:  Head: Normocephalic and atraumatic.  Eyes: EOM are normal.  Neck: Normal range of motion.  Cardiovascular: Normal rate, regular rhythm, normal heart sounds and intact distal pulses.   Pulmonary/Chest: Effort normal. No  respiratory distress. He has wheezes.  Abdominal: Soft. He exhibits no distension. There is no tenderness.  Musculoskeletal: Normal range of motion.  Neurological: He is alert and oriented to person, place, and time.  Skin: Skin is warm and dry.  Psychiatric: He has a normal mood and affect. Judgment normal.  Nursing note and vitals reviewed.   ED Course  Procedures (including critical care time) Labs Review Labs Reviewed  CBC WITH DIFFERENTIAL/PLATELET - Abnormal; Notable for the following:    RBC 2.97 (*)    Hemoglobin 10.3 (*)    HCT 31.8 (*)    MCV 107.1 (*)    MCH 34.7 (*)    RDW 16.4 (*)    All other components within normal limits  COMPREHENSIVE METABOLIC PANEL - Abnormal; Notable for the following:    Glucose, Bld 113 (*)    BUN 26 (*)    GFR calc non Af Amer 51 (*)    GFR calc Af Amer 60 (*)    All other components within normal limits  BRAIN NATRIURETIC PEPTIDE - Abnormal; Notable for the following:    B Natriuretic Peptide 119.1 (*)    All other components within normal limits  TROPONIN I    Imaging Review Dg Chest 2 View  12/08/2014   CLINICAL DATA:  Shortness of breath and cough  EXAM: CHEST  2 VIEW  COMPARISON:  09/01/2014  FINDINGS: Lungs are hypoaerated with crowding of the bronchovascular markings. Heart size upper limits of normal. Trace right pleural fluid or thickening again noted. No acute osseous finding.  IMPRESSION: Low lung volumes without focal opacity.  Trace right pleural fluid or thickening reidentified.   Electronically Signed   By: Conchita Paris M.D.   On: 12/08/2014 21:29     EKG Interpretation   Date/Time:  Monday December 08 2014 19:47:38 EST Ventricular Rate:  68 PR Interval:  196 QRS Duration: 86 QT Interval:  395 QTC Calculation: 420 R Axis:   58 Text Interpretation:  Age not entered, assumed to be  81 years old for  purpose of ECG interpretation Sinus rhythm Low voltage, precordial leads  No significant change was found  Confirmed by Nael Petrosyan  MD, Lennette Bihari ([id number]) on  12/08/2014 7:57:07 PM      MDM   Final diagnoses:  None    Patient feels much better after albuterol and Atrovent emergency department.  Likely bronchospasm/reactive airway disease.  Outpatient primary care follow-up.  Short course of steroids.  No pneumonia on x-ray.  Labs without significant abnormality.  Vitals are normal.  No increased work of breathing.  Patient understands to return to the ER for new  or worsening symptoms.    Hoy Morn, MD 12/08/14 2236

## 2014-12-08 NOTE — ED Notes (Signed)
Patient transported to X-ray 

## 2014-12-08 NOTE — ED Notes (Signed)
Pt states he came in for shortness of breath but states he feels better now after tx from EMS, pt coughing w/ clear sputum.

## 2014-12-08 NOTE — ED Notes (Signed)
Charge RN spoke to patients son and POA from [place], he states that patient has had lots of emotional stress lately with family deaths and recently spoke with his estate attorney, he wanted him to have a Management consultant. Dr Venora Maples aware of our conversation.   Dr Venora Maples spoke with patient about his care.

## 2014-12-08 NOTE — Progress Notes (Signed)
CSW met with pt at bedside. There was no family present. Pt confirms that he is from [facility]. Pt states that he has been living at the facility for 2 months. Also, pt confirms that he presents to [facility] due to SOB and cough. Pt informed CSW that he had pneumonia last week.  Pt states that he can complete all ADL's independently. He informed CSW that he feels way better than when he initially came into the ED. Pt stated " The breathing test or whatever it was, was very helpful."  Pt does not have any questions at this time.  Willette Brace [phone] ED CSW 12/08/2014 11:10 PM

## 2014-12-08 NOTE — ED Notes (Signed)
Bed: RESB Expected date: 12/08/14 Expected time: 7:14 PM Means of arrival: Ambulance Comments: Short of breath

## 2014-12-08 NOTE — Discharge Instructions (Signed)
Chronic Obstructive Pulmonary Disease  Chronic obstructive pulmonary disease (COPD) is a common lung condition in which airflow from the lungs is limited. COPD is a general term that can be used to describe many different lung problems that limit airflow, including both chronic bronchitis and emphysema. If you have COPD, your lung function will probably never return to normal, but there are measures you can take to improve lung function and make yourself feel better.   CAUSES    Smoking (common).    Exposure to secondhand smoke.    Genetic problems.   Chronic inflammatory lung diseases or recurrent infections.  SYMPTOMS    Shortness of breath, especially with physical activity.    Deep, persistent (chronic) cough with a large amount of thick mucus.    Wheezing.    Rapid breaths (tachypnea).    Gray or bluish discoloration (cyanosis) of the skin, especially in fingers, toes, or lips.    Fatigue.    Weight loss.    Frequent infections or episodes when breathing symptoms become much worse (exacerbations).    Chest tightness.  DIAGNOSIS   Your health care provider will take a medical history and perform a physical examination to make the initial diagnosis. Additional tests for COPD may include:    Lung (pulmonary) function tests.   Chest X-ray.   CT scan.   Blood tests.  TREATMENT   Treatment available to help you feel better when you have COPD includes:    Inhaler and nebulizer medicines. These help manage the symptoms of COPD and make your breathing more comfortable.   Supplemental oxygen. Supplemental oxygen is only helpful if you have a low oxygen level in your blood.    Exercise and physical activity. These are beneficial for nearly all people with COPD. Some people may also benefit from a pulmonary rehabilitation program.  HOME CARE INSTRUCTIONS    Take all medicines (inhaled or pills) as directed by your health care provider.   Avoid over-the-counter medicines or cough syrups  that dry up your airway (such as antihistamines) and slow down the elimination of secretions unless instructed otherwise by your health care provider.    If you are a smoker, the most important thing that you can do is stop smoking. Continuing to smoke will cause further lung damage and breathing trouble. Ask your health care provider for help with quitting smoking. He or she can direct you to community resources or hospitals that provide support.   Avoid exposure to irritants such as smoke, chemicals, and fumes that aggravate your breathing.   Use oxygen therapy and pulmonary rehabilitation if directed by your health care provider. If you require home oxygen therapy, ask your health care provider whether you should purchase a pulse oximeter to measure your oxygen level at home.    Avoid contact with individuals who have a contagious illness.   Avoid extreme temperature and humidity changes.   Eat healthy foods. Eating smaller, more frequent meals and resting before meals may help you maintain your strength.   Stay active, but balance activity with periods of rest. Exercise and physical activity will help you maintain your ability to do things you want to do.   Preventing infection and hospitalization is very important when you have COPD. Make sure to receive all the vaccines your health care provider recommends, especially the pneumococcal and influenza vaccines. Ask your health care provider whether you need a pneumonia vaccine.   Learn and use relaxation techniques to manage stress.   Learn   going to whistle and breathe out (exhale) through the pursed lips for 2 seconds.   Diaphragmatic breathing. Start by putting one hand on your abdomen just above  your waist. Inhale slowly through your nose. The hand on your abdomen should move out. Then purse your lips and exhale slowly. You should be able to feel the hand on your abdomen moving in as you exhale.   Learn and use controlled coughing to clear mucus from your lungs. Controlled coughing is a series of short, progressive coughs. The steps of controlled coughing are:  1. Lean your head slightly forward.  2. Breathe in deeply using diaphragmatic breathing.  3. Try to hold your breath for 3 seconds.  4. Keep your mouth slightly open while coughing twice.  5. Spit any mucus out into a tissue.  6. Rest and repeat the steps once or twice as needed. SEEK MEDICAL CARE IF:   You are coughing up more mucus than usual.   There is a change in the color or thickness of your mucus.   Your breathing is more labored than usual.   Your breathing is faster than usual.  SEEK IMMEDIATE MEDICAL CARE IF:   You have shortness of breath while you are resting.   You have shortness of breath that prevents you from:  Being able to talk.   Performing your usual physical activities.   You have chest pain lasting longer than 5 minutes.   Your skin color is more cyanotic than usual.  You measure low oxygen saturations for longer than 5 minutes with a pulse oximeter. MAKE SURE YOU:   Understand these instructions.  Will watch your condition.  Will get help right away if you are not doing well or get worse. Document Released: 07/06/2005 Document Revised: 02/10/2014 Document Reviewed: 05/23/2013 [facility] Patient Information 2015 [place], [place]. This information is not intended to replace advice given to you by your health care provider. Make sure you discuss any questions you have with your health care provider.   Emergency Department Resource Guide                       Behavioral Health Resources in the Community: Intensive Outpatient Programs Organization          Address  Phone  Notes  [facility] [place]. [address] [place], [place] [phone]   [facility] Outpatient [address] [place], [place]   [facility]: [facility] [address], [place], [place]   [place] [address],  [place], [place] or [phone]                   Psychological Services Organization         Address  Phone  Notes  [facility]  [place]  [phone]   [place] [phone] [address], [place] [phone] or [phone]    Mobile Crisis Teams Organization         Address  Phone  Notes  [facility], Mobile Crisis Care Unit  [phone]   [facility]  [address] [place], [place]   [place] [address], [place] [phone]    Self-Help/Support Groups Organization         Address  Phone             Notes  [facility] [place] - variety of support groups  [place] Call for more information  Narcotics Anonymous (NA), [facility] [address], [place]   2 meetings at this location

## 2014-12-09 ENCOUNTER — Ambulatory Visit (HOSPITAL_BASED_OUTPATIENT_CLINIC_OR_DEPARTMENT_OTHER): Payer: Medicare Other | Admitting: Hematology and Oncology

## 2014-12-09 ENCOUNTER — Other Ambulatory Visit (HOSPITAL_BASED_OUTPATIENT_CLINIC_OR_DEPARTMENT_OTHER): Payer: Medicare Other

## 2014-12-09 ENCOUNTER — Telehealth: Payer: Self-pay | Admitting: Hematology and Oncology

## 2014-12-09 VITALS — BP 122/52 | HR 78 | Temp 97.0°F | Resp 18 | Ht 68.0 in | Wt 184.4 lb

## 2014-12-09 DIAGNOSIS — J449 Chronic obstructive pulmonary disease, unspecified: Secondary | ICD-10-CM

## 2014-12-09 DIAGNOSIS — D471 Chronic myeloproliferative disease: Secondary | ICD-10-CM

## 2014-12-09 DIAGNOSIS — R059 Cough, unspecified: Secondary | ICD-10-CM

## 2014-12-09 DIAGNOSIS — D630 Anemia in neoplastic disease: Secondary | ICD-10-CM

## 2014-12-09 DIAGNOSIS — D649 Anemia, unspecified: Secondary | ICD-10-CM

## 2014-12-09 DIAGNOSIS — R05 Cough: Secondary | ICD-10-CM

## 2014-12-09 LAB — CBC WITH DIFFERENTIAL/PLATELET
BASO%: 0.4 % (ref 0.0–2.0)
BASOS ABS: 0 10*3/uL (ref 0.0–0.1)
EOS%: 0 % (ref 0.0–7.0)
Eosinophils Absolute: 0 10*3/uL (ref 0.0–0.5)
HEMATOCRIT: 31.9 % — AB (ref 38.4–49.9)
HGB: 10.3 g/dL — ABNORMAL LOW (ref 13.0–17.1)
LYMPH%: 10.6 % — ABNORMAL LOW (ref 14.0–49.0)
MCH: 34.7 pg — ABNORMAL HIGH (ref 27.2–33.4)
MCHC: 32.5 g/dL (ref 32.0–36.0)
MCV: 106.8 fL — ABNORMAL HIGH (ref 79.3–98.0)
MONO#: 0.1 10*3/uL (ref 0.1–0.9)
MONO%: 2 % (ref 0.0–14.0)
NEUT#: 5.9 10*3/uL (ref 1.5–6.5)
NEUT%: 87 % — ABNORMAL HIGH (ref 39.0–75.0)
PLATELETS: 356 10*3/uL (ref 140–400)
RBC: 2.98 10*6/uL — AB (ref 4.20–5.82)
RDW: 16.9 % — ABNORMAL HIGH (ref 11.0–14.6)
WBC: 6.8 10*3/uL (ref 4.0–10.3)
lymph#: 0.7 10*3/uL — ABNORMAL LOW (ref 0.9–3.3)

## 2014-12-09 LAB — TECHNOLOGIST REVIEW

## 2014-12-09 MED ORDER — AZITHROMYCIN 500 MG PO TABS: 500.0000 mg | ORAL_TABLET | Freq: Every day | ORAL | Status: DC

## 2014-12-09 NOTE — Assessment & Plan Note (Signed)
He went to the emergency department last night with significant productive cough. On clinical exam, it appears that he has COPD exacerbation. He is producing yellow sputum. Recommend 3 days azithromycin along with the prescribed bronchodilator and prednisone therapy.

## 2014-12-09 NOTE — Assessment & Plan Note (Signed)
This is due to recent illness and hydroxyurea. He is doing better on reduced dose hydroxyurea and we will continue the same.

## 2014-12-09 NOTE — Progress Notes (Signed)
[place] OFFICE PROGRESS NOTE  Patient Care Team: Hendricks Limes, MD as PCP - General  SUMMARY OF ONCOLOGIC HISTORY:  He was found to have abnormal CBC from recent CBC monitoring when he saw his primary care provider for recurrent falls. This patient had history disease, with angioplasty and stent placement in 2002 and 2005.  In May of this year, he fell at [employer]. After a detailed history, he stated that he had misjudged the distance between his hands and railing and fell. He did not sustain major injury but his CBC showed mild leukocytosis, anemia and very high platelet count, platelet count over 800,000. According to his son, the patient may have transient ischemic attack with mild weakness which subsequently resolved. A week ago, he had accident and had broken ribs. He had CT imaging study which confirmed retractors. Repeat CBC showed platelet count over 1 million. He is hence referred here. 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 The patient was started on hydroxyurea. On 05/16/2014, hydroxyurea is increased to 2 tablets a day On 06/09/2014, hydroxyurea dose is reduced back to one tablet per day.  on 09/09/2014, hydroxyurea is reduced to every other day. On 10/09/2014, hydroxyurea was placed on hold due to progressive anemia In February 2016, hydroxyurea is reduced to 500 mg on Mondays and Fridays only and hold the weekends  INTERVAL HISTORY: Please see below for problem oriented charting. He feels terrible because of chronic, nonproductive cough of yellow sputum. He went to the emergency department but was not admitted. Today, his cough was getting worse and he requests antibiotic treatment.  REVIEW OF SYSTEMS:   Constitutional: Denies fevers, chills or abnormal weight loss Eyes: Denies blurriness of vision Ears, nose, mouth, throat, and face: Denies mucositis or sore throat Cardiovascular: Denies palpitation, chest discomfort or lower  extremity swelling Gastrointestinal:  Denies nausea, heartburn or change in bowel habits Skin: Denies abnormal skin rashes Lymphatics: Denies new lymphadenopathy or easy bruising Neurological:Denies numbness, tingling or new weaknesses Behavioral/Psych: Mood is stable, no new changes  All other systems were reviewed with the patient and are negative.  I have reviewed the past medical history, past surgical history, social history and family history with the patient and they are unchanged from previous note.  ALLERGIES:  is allergic to cefprozil and simvastatin.  MEDICATIONS:  Current Outpatient Prescriptions  Medication Sig Dispense Refill  . aspirin 81 MG tablet Take 81 mg by mouth daily.      . benzonatate (TESSALON) 100 MG capsule Take 100 mg by mouth 2 (two) times daily.    . budesonide-formoterol (SYMBICORT) 160-4.5 MCG/ACT inhaler Inhale 2 puffs into the lungs 2 (two) times daily.    . chlorhexidine (PERIDEX) 0.12 % solution Use as directed 15 mLs in the mouth or throat 2 (two) times daily.    . furosemide (LASIX) 40 MG tablet Take 40 mg by mouth.    Marland Kitchen guaiFENesin (MUCINEX) 600 MG 12 hr tablet Take 600 mg by mouth 2 (two) times daily.    . hydroxyurea (HYDREA) 500 MG capsule Take 1 capsule (500 mg total) by mouth daily. May take with food to minimize GI side effects. Take 1 pill daily except Saturdays and Sundays. (Patient taking differently: Take 500 mg by mouth daily. May take with food to minimize GI side effects.  except Saturdays and Sundays.) 20 capsule 0  . isosorbide dinitrate (ISORDIL) 30 MG tablet Take 30 mg by mouth daily.    Marland Kitchen LORazepam (ATIVAN) 0.5 MG  tablet Take 0.5 mg by mouth every 8 (eight) hours as needed for anxiety.    . metoprolol succinate (TOPROL-XL) 25 MG 24 hr tablet Take 1 tablet (25 mg total) by mouth daily.    . Multiple Vitamins-Minerals (ADULT GUMMY PO) Take 1 tablet by mouth daily.    . nitroGLYCERIN (NITROSTAT) 0.4 MG SL tablet Place 0.4 mg under the  tongue every 5 (five) minutes as needed for chest pain.    . pantoprazole (PROTONIX) 40 MG tablet Take 1 tablet (40 mg total) by mouth daily. 30 tablet 2  . predniSONE (DELTASONE) 10 MG tablet Take 6 tablets (60 mg total) by mouth daily. 30 tablet 0  . sertraline (ZOLOFT) 50 MG tablet Take 50 mg by mouth daily.    Marland Kitchen azithromycin (ZITHROMAX) 500 MG tablet Take 1 tablet (500 mg total) by mouth daily. 3 tablet 0   No current facility-administered medications for this visit.    PHYSICAL EXAMINATION: ECOG PERFORMANCE STATUS: 2 - Symptomatic, <50% confined to bed  Filed Vitals:   12/09/14 1113  BP: 122/52  Pulse: 78  Temp: 97 F (36.1 C)  Resp: 18   Filed Weights   12/09/14 1113  Weight: 184 lb 6.4 oz (83.643 kg)    GENERAL:alert, no distress and comfortable. He has nonstop coughing in my office SKIN: skin color, texture, turgor are normal, no rashes or significant lesions EYES: normal, Conjunctiva are pink and non-injected, sclera clear OROPHARYNX:no exudate, no erythema and lips, buccal mucosa, and tongue normal  NECK: supple, thyroid normal size, non-tender, without nodularity LYMPH:  no palpable lymphadenopathy in the cervical, axillary or inguinal LUNGS: Significant wheezes with increased breathing effort HEART: regular rate & rhythm and no murmurs and no lower extremity edema ABDOMEN:abdomen soft, non-tender and normal bowel sounds Musculoskeletal:no cyanosis of digits and no clubbing  NEURO: alert & oriented x 3 with fluent speech, no focal motor/sensory deficits  LABORATORY DATA:  I have reviewed the data as listed    Component Value Date/Time   NA 135 12/08/2014 1957   NA 143 05/07/2014 1445   K 3.9 12/08/2014 1957   K 4.4 05/07/2014 1445   CL 99 12/08/2014 1957   CO2 27 12/08/2014 1957   CO2 28 05/07/2014 1445   GLUCOSE 113* 12/08/2014 1957   GLUCOSE 102 05/07/2014 1445   BUN 26* 12/08/2014 1957   BUN 21.6 05/07/2014 1445   CREATININE 1.22 12/08/2014 1957    CREATININE 1.2 05/07/2014 1445   CALCIUM 8.5 12/08/2014 1957   CALCIUM 8.6 05/07/2014 1445   PROT 6.4 12/08/2014 1957   PROT 5.7* 05/07/2014 1445   ALBUMIN 4.1 12/08/2014 1957   ALBUMIN 3.3* 05/07/2014 1445   AST 26 12/08/2014 1957   AST 25 05/07/2014 1445   ALT 21 12/08/2014 1957   ALT 18 05/07/2014 1445   ALKPHOS 80 12/08/2014 1957   ALKPHOS 103 05/07/2014 1445   BILITOT 0.7 12/08/2014 1957   BILITOT 0.43 05/07/2014 1445   GFRNONAA 51* 12/08/2014 1957   GFRAA 60* 12/08/2014 1957    No results found for: SPEP, UPEP  Lab Results  Component Value Date   WBC 6.8 12/09/2014   NEUTROABS 5.9 12/09/2014   HGB 10.3* 12/09/2014   HCT 31.9* 12/09/2014   MCV 106.8* 12/09/2014   PLT 356 12/09/2014      Chemistry      Component Value Date/Time   NA 135 12/08/2014 1957   NA 143 05/07/2014 1445   K 3.9 12/08/2014 1957   K  4.4 05/07/2014 1445   CL 99 12/08/2014 1957   CO2 27 12/08/2014 1957   CO2 28 05/07/2014 1445   BUN 26* 12/08/2014 1957   BUN 21.6 05/07/2014 1445   CREATININE 1.22 12/08/2014 1957   CREATININE 1.2 05/07/2014 1445      Component Value Date/Time   CALCIUM 8.5 12/08/2014 1957   CALCIUM 8.6 05/07/2014 1445   ALKPHOS 80 12/08/2014 1957   ALKPHOS 103 05/07/2014 1445   AST 26 12/08/2014 1957   AST 25 05/07/2014 1445   ALT 21 12/08/2014 1957   ALT 18 05/07/2014 1445   BILITOT 0.7 12/08/2014 1957   BILITOT 0.43 05/07/2014 1445       RADIOGRAPHIC STUDIES: I have personally reviewed the radiological images as listed and agreed with the findings in the report. Dg Chest 2 View  12/08/2014   CLINICAL DATA:  Shortness of breath and cough  EXAM: CHEST  2 VIEW  COMPARISON:  09/01/2014  FINDINGS: Lungs are hypoaerated with crowding of the bronchovascular markings. Heart size upper limits of normal. Trace right pleural fluid or thickening again noted. No acute osseous finding.  IMPRESSION: Low lung volumes without focal opacity.  Trace right pleural fluid or  thickening reidentified.   Electronically Signed   By: Conchita Paris M.D.   On: 12/08/2014 21:29     ASSESSMENT & PLAN:  Anemia in neoplastic disease This is due to recent illness and hydroxyurea. He is doing better on reduced dose hydroxyurea and we will continue the same.   Myeloproliferative disease He is not tolerating treatment well, especially with recent illness and hospitalization. We will continue on reduced dose hydroxyurea.     COPD GOLD II He went to the emergency department last night with significant productive cough. On clinical exam, it appears that he has COPD exacerbation. He is producing yellow sputum. Recommend 3 days azithromycin along with the prescribed bronchodilator and prednisone therapy.    No orders of the defined types were placed in this encounter.   All questions were answered. The patient knows to call the clinic with any problems, questions or concerns. No barriers to learning was detected. I spent 25 minutes counseling the patient face to face. The total time spent in the appointment was 30 minutes and more than 50% was on counseling and review of test results     [facility], Elsa, MD 12/09/2014 2:40 PM

## 2014-12-09 NOTE — Assessment & Plan Note (Signed)
He is not tolerating treatment well, especially with recent illness and hospitalization. We will continue on reduced dose hydroxyurea.

## 2014-12-09 NOTE — Telephone Encounter (Signed)
gv and printed appt sched and avs for pt June

## 2014-12-15 ENCOUNTER — Encounter (HOSPITAL_COMMUNITY): Payer: Self-pay | Admitting: *Deleted

## 2014-12-15 ENCOUNTER — Emergency Department (HOSPITAL_COMMUNITY): Payer: Medicare Other

## 2014-12-15 ENCOUNTER — Emergency Department (HOSPITAL_COMMUNITY)
Admission: EM | Admit: 2014-12-15 | Discharge: 2014-12-15 | Disposition: A | Discharge status: Home / Self Care | Payer: Medicare Other | Attending: Emergency Medicine | Admitting: Emergency Medicine

## 2014-12-15 DIAGNOSIS — K279 Peptic ulcer, site unspecified, unspecified as acute or chronic, without hemorrhage or perforation: Secondary | ICD-10-CM | POA: Diagnosis not present

## 2014-12-15 DIAGNOSIS — Z8781 Personal history of (healed) traumatic fracture: Secondary | ICD-10-CM | POA: Diagnosis not present

## 2014-12-15 DIAGNOSIS — Z8709 Personal history of other diseases of the respiratory system: Secondary | ICD-10-CM | POA: Diagnosis not present

## 2014-12-15 DIAGNOSIS — I2510 Atherosclerotic heart disease of native coronary artery without angina pectoris: Secondary | ICD-10-CM | POA: Diagnosis not present

## 2014-12-15 DIAGNOSIS — Z8669 Personal history of other diseases of the nervous system and sense organs: Secondary | ICD-10-CM | POA: Insufficient documentation

## 2014-12-15 DIAGNOSIS — Z9181 History of falling: Secondary | ICD-10-CM | POA: Insufficient documentation

## 2014-12-15 DIAGNOSIS — Z792 Long term (current) use of antibiotics: Secondary | ICD-10-CM | POA: Diagnosis not present

## 2014-12-15 DIAGNOSIS — Z87891 Personal history of nicotine dependence: Secondary | ICD-10-CM | POA: Insufficient documentation

## 2014-12-15 DIAGNOSIS — I10 Essential (primary) hypertension: Secondary | ICD-10-CM | POA: Diagnosis not present

## 2014-12-15 DIAGNOSIS — Z8639 Personal history of other endocrine, nutritional and metabolic disease: Secondary | ICD-10-CM | POA: Insufficient documentation

## 2014-12-15 DIAGNOSIS — Z8659 Personal history of other mental and behavioral disorders: Secondary | ICD-10-CM | POA: Diagnosis not present

## 2014-12-15 DIAGNOSIS — R0602 Shortness of breath: Secondary | ICD-10-CM | POA: Diagnosis present

## 2014-12-15 DIAGNOSIS — Z7982 Long term (current) use of aspirin: Secondary | ICD-10-CM | POA: Insufficient documentation

## 2014-12-15 DIAGNOSIS — Z7951 Long term (current) use of inhaled steroids: Secondary | ICD-10-CM | POA: Insufficient documentation

## 2014-12-15 DIAGNOSIS — Z862 Personal history of diseases of the blood and blood-forming organs and certain disorders involving the immune mechanism: Secondary | ICD-10-CM | POA: Insufficient documentation

## 2014-12-15 DIAGNOSIS — Z79899 Other long term (current) drug therapy: Secondary | ICD-10-CM | POA: Diagnosis not present

## 2014-12-15 DIAGNOSIS — Z86010 Personal history of colonic polyps: Secondary | ICD-10-CM | POA: Diagnosis not present

## 2014-12-15 MED ORDER — ALBUTEROL SULFATE (2.5 MG/3ML) 0.083% IN NEBU
5.0000 mg | INHALATION_SOLUTION | Freq: Once | RESPIRATORY_TRACT | Status: AC
  Administered 2014-12-15: 5 mg via RESPIRATORY_TRACT
  Filled 2014-12-15: qty 6

## 2014-12-15 NOTE — Discharge Instructions (Signed)
Return to the ED with any concerns including chest pain, difficulty breathing, fainting, decreased level of alertness/lethargy, or any other alarming symptoms

## 2014-12-15 NOTE — ED Notes (Signed)
Pt off unit with xray.  Pt primary PCP called to check on him.

## 2014-12-15 NOTE — ED Provider Notes (Signed)
CSN: [id number]     Arrival date & time 12/15/14  1607 History   First MD Initiated Contact with Patient 12/15/14 1609     Chief Complaint  Patient presents with  . Shortness of Breath     (Consider location/radiation/quality/duration/timing/severity/associated sxs/prior Treatment) HPI  Pt presenting with c/o shortness of breath.  He states he was at his assisted living facility and they were painting "knick knacks" when the fumes caused him to become short of breath.  He has hx of RAD.  He feels improved after being out of that area.  No current shortness of breath.  No chest pain.  Earlier in the day he was feeling in his usual state of health.  No change in his baseline leg swelling.  He was treated 2/29 for COPD exacerbation and has been feeling well since that time.  There are no other associated systemic symptoms, there are no other alleviating or modifying factors.   Past Medical History  Diagnosis Date  . Loss of height   . Compression fracture   . Hypertension   . Hypercholesterolemia   . Rhinitis   . Dyspnea   . Weight loss   . CAD (coronary artery disease)   . Peptic ulcer disease   . Alcohol abuse   . AMI (acute mesenteric ischemia)   . Colon polyps   . Pleurisy with effusion   . Grief reaction   . Macular degeneration   . Leukocytosis, unspecified 05/07/2014  . Bilateral leg edema 05/07/2014  . Recurrent falls 05/07/2014   Past Surgical History  Procedure Laterality Date  . Exploratory laparotomy  1978    due to kidney problems  . Thoracotomy  1982    hemothorax  . Back surgery  2003    Cervical Spondylosis  . Spine surgery  2003    C-spine  . Cardiac catheterization  06/21/03  . Coronary stent placement  07/2003    x3  . Coronary angioplasty with stent placement  05/2004  . Colonoscopy w/ polypectomy  11/2006   Family History  Problem Relation Age of Onset  . Diabetes Father   . Colon cancer Mother   . Colon cancer Son    History  Substance Use Topics   . Smoking status: Former Smoker    Quit date: 10/10/1946  . Smokeless tobacco: Never Used     Comment: cigars during college  . Alcohol Use: No     Comment: recovered ETOH abuser    Review of Systems  ROS reviewed and all otherwise negative except for mentioned in HPI    Allergies  Cefprozil and Simvastatin  Home Medications   Prior to Admission medications   Medication Sig Start Date End Date Taking? Authorizing Provider  albuterol (PROVENTIL HFA;VENTOLIN HFA) 108 (90 BASE) MCG/ACT inhaler Inhale 1-2 puffs into the lungs every 6 (six) hours as needed for wheezing or shortness of breath.   Yes Historical Provider, MD  aspirin 81 MG tablet Take 81 mg by mouth daily.     Yes Historical Provider, MD  azithromycin (ZITHROMAX) 500 MG tablet Take 1 tablet (500 mg total) by mouth daily. 12/09/14  Yes Heath Lark, MD  budesonide-formoterol (SYMBICORT) 160-4.5 MCG/ACT inhaler Inhale 2 puffs into the lungs 2 (two) times daily.   Yes Historical Provider, MD  chlorhexidine (PERIDEX) 0.12 % solution Use as directed 15 mLs in the mouth or throat 2 (two) times daily.   Yes Historical Provider, MD  furosemide (LASIX) 40 MG tablet Take 40 mg  by mouth.   Yes Historical Provider, MD  GLUCERNA (GLUCERNA) LIQD Take 237 mLs by mouth 2 (two) times daily between meals.   Yes Historical Provider, MD  hydroxyurea (HYDREA) 500 MG capsule Take 1 capsule (500 mg total) by mouth daily. May take with food to minimize GI side effects. Take 1 pill daily except Saturdays and Sundays. Patient taking differently: Take 500 mg by mouth daily. May take with food to minimize GI side effects.  except Saturdays and Sundays. 11/10/14  Yes Heath Lark, MD  isosorbide dinitrate (ISORDIL) 30 MG tablet Take 30 mg by mouth daily. 09/03/14  Yes Historical Provider, MD  LORazepam (ATIVAN) 0.5 MG tablet Take 0.5 mg by mouth every 8 (eight) hours as needed for anxiety.   Yes Historical Provider, MD  metoprolol succinate (TOPROL-XL) 25 MG 24  hr tablet Take 1 tablet (25 mg total) by mouth daily. 08/11/14  Yes Geradine Girt, DO  Multiple Vitamins-Minerals (ADULT GUMMY PO) Take 1 tablet by mouth daily.   Yes Historical Provider, MD  nitroGLYCERIN (NITROSTAT) 0.4 MG SL tablet Place 0.4 mg under the tongue every 5 (five) minutes as needed for chest pain.   Yes Historical Provider, MD  pantoprazole (PROTONIX) 40 MG tablet Take 1 tablet (40 mg total) by mouth daily. 09/19/14  Yes Tanda Rockers, MD  sertraline (ZOLOFT) 50 MG tablet Take 75 mg by mouth daily.    Yes Historical Provider, MD  spironolactone (ALDACTONE) 25 MG tablet Take 12.5 mg by mouth daily.   Yes Historical Provider, MD  predniSONE (DELTASONE) 10 MG tablet Take 6 tablets (60 mg total) by mouth daily. Patient not taking: Reported on 12/15/2014 12/08/14   Jola Schmidt, MD   BP 138/75 mmHg  Pulse 71  Temp(Src) 98.3 F (36.8 C) (Oral)  Resp 18  Ht 5\' 7"  (1.702 m)  Wt 186 lb (84.369 kg)  BMI 29.12 kg/m2  SpO2 99%  Vitals reviewed Physical Exam  Physical Examination: General appearance - alert, well appearing, and in no distress Mental status - alert, oriented to person, place, and time Eyes -no conjunctival injection, no scleral icterus Mouth - mucous membranes moist, pharynx normal without lesions Chest - clear to auscultation, no wheezes, rales or rhonchi, symmetric air entry Heart - normal rate, regular rhythm, normal S1, S2, no murmurs, rubs, clicks or gallops Abdomen - soft, nontender, nondistended, no masses or organomegaly Extremities - peripheral pulses normal, no pedal edema, no clubbing or cyanosis Skin - normal coloration and turgor, no rashes  ED Course  Procedures (including critical care time) Labs Review Labs Reviewed - No data to display  Imaging Review Dg Chest 2 View  12/15/2014   CLINICAL DATA:  Acute on chronic shortness of breath and chest pain for 2 hours.  EXAM: CHEST  2 VIEW  COMPARISON:  PA and lateral chest 12/08/2014.  CT chest 04/24/2014.   FINDINGS: Mild linear atelectasis or scar is seen the lung bases. The lungs are otherwise clear. No pneumothorax or pleural effusion is identified. Heart size is normal.  IMPRESSION: No acute disease.   Electronically Signed   By: Inge Rise M.D.   On: 12/15/2014 17:40     EKG Interpretation   Date/Time:  Monday December 15 2014 16:18:30 EST Ventricular Rate:  77 PR Interval:  179 QRS Duration: 87 QT Interval:  352 QTC Calculation: 398 R Axis:   63 Text Interpretation:  Sinus rhythm Low voltage, precordial leads Baseline  wander in lead(s) V6 No significant change since  last tracing Confirmed by  [facility]  MD, [place] [phone]) on 12/15/2014 4:21:37 PM      MDM   Final diagnoses:  Shortness of breath    Pt presenting with episode of shortness of breath due to breathing paint fumes during a craft activity per report.  Pt feels improved upon arrival to the ED.  He has a normal exam, no wheezing of lungs, normal respiratory effort.  CXR is reassuring as well.  Pt cleared for discharge back to facility.  This appears to have been triggered by inhaling paint, no need for steroids, abx or other workup today.  I have discussed this ED visit with patient's son in [place].  He is concerned about patient being depressed, advised him to talk with the facility and arrange for outpatient followup.  No suicidality or homicidality in the ED today.  Discharged with strict return precautions.  Pt agreeable with plan.    Alfonzo Beers, MD 12/16/14 [phone]

## 2014-12-15 NOTE — ED Notes (Addendum)
Pt had pneumonia on 12/10/14 and was given prednisone, antibiotics and was cleared per Xray.  Pt states he has been coughing up yellow thick sputum.  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

## 2014-12-15 NOTE — ED Notes (Signed)
Resident of [facility] was called out because of SOB and Chest pain, productive cough with yellow sputum. Alert x3. 18 Lt AC . Vitals 96% 2L 130/82 Pulse 96

## 2014-12-17 ENCOUNTER — Encounter: Payer: Self-pay | Admitting: Internal Medicine

## 2014-12-17 ENCOUNTER — Ambulatory Visit (INDEPENDENT_AMBULATORY_CARE_PROVIDER_SITE_OTHER): Payer: Medicare Other | Admitting: Internal Medicine

## 2014-12-17 VITALS — BP 110/60 | HR 72 | Temp 98.3°F

## 2014-12-17 DIAGNOSIS — R413 Other amnesia: Secondary | ICD-10-CM

## 2014-12-17 DIAGNOSIS — J449 Chronic obstructive pulmonary disease, unspecified: Secondary | ICD-10-CM

## 2014-12-17 DIAGNOSIS — J441 Chronic obstructive pulmonary disease with (acute) exacerbation: Secondary | ICD-10-CM

## 2014-12-17 NOTE — Progress Notes (Signed)
Pre visit review using our clinic review tool, if applicable. No additional management support is needed unless otherwise documented below in the visit note. 
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
## 2014-12-17 NOTE — Assessment & Plan Note (Signed)
Avoid chemical & fume exposures

## 2014-12-18 ENCOUNTER — Ambulatory Visit (INDEPENDENT_AMBULATORY_CARE_PROVIDER_SITE_OTHER): Payer: 59 | Admitting: Psychology

## 2014-12-18 DIAGNOSIS — F339 Major depressive disorder, recurrent, unspecified: Secondary | ICD-10-CM | POA: Diagnosis not present

## 2014-12-18 NOTE — Patient Instructions (Signed)
Please follow up with your son and your financial advisor as we discussed.

## 2014-12-22 ENCOUNTER — Telehealth: Payer: Self-pay

## 2014-12-22 NOTE — Telephone Encounter (Signed)
12/17/14 office visit has been faxed to Dr Wynona Dove @ [phone]. Do not see an address or name of patient's son in [place]

## 2014-12-22 NOTE — Telephone Encounter (Signed)
-----   Message from Hendricks Limes, MD sent at 12/18/2014  5:27 PM EST ----- Please FAX office visit  Dr Grayling Congress @ [facility] to her son in [place] please

## 2014-12-24 ENCOUNTER — Other Ambulatory Visit: Payer: Self-pay | Admitting: Family Medicine

## 2014-12-24 DIAGNOSIS — R4689 Other symptoms and signs involving appearance and behavior: Secondary | ICD-10-CM

## 2015-01-01 ENCOUNTER — Ambulatory Visit (INDEPENDENT_AMBULATORY_CARE_PROVIDER_SITE_OTHER): Payer: 59 | Admitting: Psychology

## 2015-01-01 DIAGNOSIS — F339 Major depressive disorder, recurrent, unspecified: Secondary | ICD-10-CM | POA: Diagnosis not present

## 2015-02-25 ENCOUNTER — Inpatient Hospital Stay (HOSPITAL_COMMUNITY)
Admission: EM | Admit: 2015-02-25 | Discharge: 2015-02-28 | DRG: 177 | Disposition: A | Discharge status: Nursing Home (Includes ICF) | Payer: Medicare Other | Attending: Internal Medicine | Admitting: Internal Medicine

## 2015-02-25 ENCOUNTER — Encounter (HOSPITAL_COMMUNITY): Payer: Self-pay | Admitting: Emergency Medicine

## 2015-02-25 ENCOUNTER — Emergency Department (HOSPITAL_COMMUNITY): Payer: Medicare Other

## 2015-02-25 DIAGNOSIS — Z8701 Personal history of pneumonia (recurrent): Secondary | ICD-10-CM | POA: Diagnosis not present

## 2015-02-25 DIAGNOSIS — J441 Chronic obstructive pulmonary disease with (acute) exacerbation: Secondary | ICD-10-CM | POA: Diagnosis present

## 2015-02-25 DIAGNOSIS — J189 Pneumonia, unspecified organism: Secondary | ICD-10-CM | POA: Diagnosis not present

## 2015-02-25 DIAGNOSIS — Z888 Allergy status to other drugs, medicaments and biological substances status: Secondary | ICD-10-CM | POA: Diagnosis not present

## 2015-02-25 DIAGNOSIS — J690 Pneumonitis due to inhalation of food and vomit: Secondary | ICD-10-CM | POA: Diagnosis present

## 2015-02-25 DIAGNOSIS — R1313 Dysphagia, pharyngeal phase: Secondary | ICD-10-CM | POA: Diagnosis present

## 2015-02-25 DIAGNOSIS — C946 Myelodysplastic disease, not classified: Secondary | ICD-10-CM | POA: Diagnosis present

## 2015-02-25 DIAGNOSIS — N179 Acute kidney failure, unspecified: Secondary | ICD-10-CM | POA: Diagnosis present

## 2015-02-25 DIAGNOSIS — I2510 Atherosclerotic heart disease of native coronary artery without angina pectoris: Secondary | ICD-10-CM | POA: Diagnosis present

## 2015-02-25 DIAGNOSIS — Z9981 Dependence on supplemental oxygen: Secondary | ICD-10-CM

## 2015-02-25 DIAGNOSIS — J310 Chronic rhinitis: Secondary | ICD-10-CM | POA: Diagnosis present

## 2015-02-25 DIAGNOSIS — J9620 Acute and chronic respiratory failure, unspecified whether with hypoxia or hypercapnia: Secondary | ICD-10-CM | POA: Diagnosis present

## 2015-02-25 DIAGNOSIS — D471 Chronic myeloproliferative disease: Secondary | ICD-10-CM | POA: Diagnosis present

## 2015-02-25 DIAGNOSIS — Z8711 Personal history of peptic ulcer disease: Secondary | ICD-10-CM

## 2015-02-25 DIAGNOSIS — Z87891 Personal history of nicotine dependence: Secondary | ICD-10-CM

## 2015-02-25 DIAGNOSIS — Z833 Family history of diabetes mellitus: Secondary | ICD-10-CM | POA: Diagnosis not present

## 2015-02-25 DIAGNOSIS — Z955 Presence of coronary angioplasty implant and graft: Secondary | ICD-10-CM

## 2015-02-25 DIAGNOSIS — D61810 Antineoplastic chemotherapy induced pancytopenia: Secondary | ICD-10-CM | POA: Diagnosis present

## 2015-02-25 DIAGNOSIS — R4702 Dysphasia: Secondary | ICD-10-CM | POA: Diagnosis present

## 2015-02-25 DIAGNOSIS — H3530 Unspecified macular degeneration: Secondary | ICD-10-CM | POA: Diagnosis present

## 2015-02-25 DIAGNOSIS — E780 Pure hypercholesterolemia: Secondary | ICD-10-CM | POA: Diagnosis present

## 2015-02-25 DIAGNOSIS — Z79899 Other long term (current) drug therapy: Secondary | ICD-10-CM | POA: Diagnosis not present

## 2015-02-25 DIAGNOSIS — D630 Anemia in neoplastic disease: Secondary | ICD-10-CM | POA: Diagnosis present

## 2015-02-25 DIAGNOSIS — Z881 Allergy status to other antibiotic agents status: Secondary | ICD-10-CM

## 2015-02-25 DIAGNOSIS — T451X5A Adverse effect of antineoplastic and immunosuppressive drugs, initial encounter: Secondary | ICD-10-CM | POA: Diagnosis present

## 2015-02-25 DIAGNOSIS — I10 Essential (primary) hypertension: Secondary | ICD-10-CM | POA: Diagnosis present

## 2015-02-25 DIAGNOSIS — Z86010 Personal history of colonic polyps: Secondary | ICD-10-CM | POA: Diagnosis not present

## 2015-02-25 DIAGNOSIS — J449 Chronic obstructive pulmonary disease, unspecified: Secondary | ICD-10-CM | POA: Diagnosis present

## 2015-02-25 DIAGNOSIS — Z7982 Long term (current) use of aspirin: Secondary | ICD-10-CM

## 2015-02-25 DIAGNOSIS — R0602 Shortness of breath: Secondary | ICD-10-CM | POA: Diagnosis present

## 2015-02-25 DIAGNOSIS — Z800 Family history of malignant neoplasm of digestive organs: Secondary | ICD-10-CM

## 2015-02-25 DIAGNOSIS — J9621 Acute and chronic respiratory failure with hypoxia: Secondary | ICD-10-CM | POA: Diagnosis not present

## 2015-02-25 HISTORY — DX: Chronic obstructive pulmonary disease, unspecified: J44.9

## 2015-02-25 LAB — CBC
HEMATOCRIT: 33 % — AB (ref 39.0–52.0)
HEMOGLOBIN: 10.9 g/dL — AB (ref 13.0–17.0)
MCH: 36 pg — ABNORMAL HIGH (ref 26.0–34.0)
MCHC: 33 g/dL (ref 30.0–36.0)
MCV: 108.9 fL — ABNORMAL HIGH (ref 78.0–100.0)
Platelets: 120 10*3/uL — ABNORMAL LOW (ref 150–400)
RBC: 3.03 MIL/uL — ABNORMAL LOW (ref 4.22–5.81)
RDW: 18.3 % — ABNORMAL HIGH (ref 11.5–15.5)
WBC: 3.4 10*3/uL — AB (ref 4.0–10.5)

## 2015-02-25 LAB — BASIC METABOLIC PANEL
ANION GAP: 10 (ref 5–15)
BUN: 28 mg/dL — ABNORMAL HIGH (ref 6–20)
CHLORIDE: 102 mmol/L (ref 101–111)
CO2: 28 mmol/L (ref 22–32)
Calcium: 8.8 mg/dL — ABNORMAL LOW (ref 8.9–10.3)
Creatinine, Ser: 1.47 mg/dL — ABNORMAL HIGH (ref 0.61–1.24)
GFR calc non Af Amer: 41 mL/min — ABNORMAL LOW (ref >60)
GFR, EST AFRICAN AMERICAN: 48 mL/min — AB (ref >60)
Glucose, Bld: 113 mg/dL — ABNORMAL HIGH (ref 65–99)
POTASSIUM: 4.3 mmol/L (ref 3.5–5.1)
Sodium: 140 mmol/L (ref 135–145)

## 2015-02-25 LAB — TROPONIN I: Troponin I: <0.03 ng/mL (ref <0.031)

## 2015-02-25 LAB — LACTIC ACID, PLASMA
LACTIC ACID, VENOUS: 1.4 mmol/L (ref 0.5–2.0)
Lactic Acid, Venous: 1.3 mmol/L (ref 0.5–2.0)

## 2015-02-25 LAB — CBG MONITORING, ED: Glucose-Capillary: 84 mg/dL (ref 65–99)

## 2015-02-25 MED ORDER — DEXTROSE 5 % IV SOLN
1.0000 g | Freq: Once | INTRAVENOUS | Status: AC
  Administered 2015-02-25: 1 g via INTRAVENOUS
  Filled 2015-02-25: qty 10

## 2015-02-25 MED ORDER — ACETAMINOPHEN 650 MG RE SUPP: 650.0000 mg | Freq: Four times a day (QID) | RECTAL | Status: DC | PRN

## 2015-02-25 MED ORDER — HYDROXYUREA 500 MG PO CAPS: 500.0000 mg | ORAL_CAPSULE | ORAL | Status: DC

## 2015-02-25 MED ORDER — CEFTRIAXONE SODIUM IN DEXTROSE 20 MG/ML IV SOLN
1.0000 g | INTRAVENOUS | Status: DC
  Administered 2015-02-26 – 2015-02-27 (×2): 1 g via INTRAVENOUS
  Filled 2015-02-25 (×3): qty 50

## 2015-02-25 MED ORDER — QUETIAPINE FUMARATE 25 MG PO TABS
25.0000 mg | ORAL_TABLET | Freq: Every day | ORAL | Status: DC
  Administered 2015-02-25 – 2015-02-27 (×3): 25 mg via ORAL
  Filled 2015-02-25 (×3): qty 1

## 2015-02-25 MED ORDER — PANTOPRAZOLE SODIUM 40 MG PO TBEC
40.0000 mg | DELAYED_RELEASE_TABLET | Freq: Every day | ORAL | Status: DC
  Administered 2015-02-26 – 2015-02-28 (×3): 40 mg via ORAL
  Filled 2015-02-25 (×3): qty 1

## 2015-02-25 MED ORDER — ACETAMINOPHEN-CODEINE #3 300-30 MG PO TABS: 1.0000 | ORAL_TABLET | Freq: Four times a day (QID) | ORAL | Status: DC | PRN

## 2015-02-25 MED ORDER — SERTRALINE HCL 50 MG PO TABS
75.0000 mg | ORAL_TABLET | Freq: Every day | ORAL | Status: DC
  Administered 2015-02-26 – 2015-02-28 (×3): 75 mg via ORAL
  Filled 2015-02-25 (×6): qty 1

## 2015-02-25 MED ORDER — ALBUTEROL SULFATE (2.5 MG/3ML) 0.083% IN NEBU
2.5000 mg | INHALATION_SOLUTION | Freq: Four times a day (QID) | RESPIRATORY_TRACT | Status: DC
  Administered 2015-02-25: 2.5 mg via RESPIRATORY_TRACT
  Filled 2015-02-25: qty 3

## 2015-02-25 MED ORDER — DIVALPROEX SODIUM ER 250 MG PO TB24: 250.0000 mg | ORAL_TABLET | Freq: Two times a day (BID) | ORAL | Status: DC

## 2015-02-25 MED ORDER — METOPROLOL SUCCINATE ER 25 MG PO TB24
25.0000 mg | ORAL_TABLET | Freq: Every day | ORAL | Status: DC
  Administered 2015-02-26 – 2015-02-28 (×3): 25 mg via ORAL
  Filled 2015-02-25 (×3): qty 1

## 2015-02-25 MED ORDER — IPRATROPIUM BROMIDE 0.02 % IN SOLN
0.5000 mg | Freq: Four times a day (QID) | RESPIRATORY_TRACT | Status: DC
  Administered 2015-02-25: 0.5 mg via RESPIRATORY_TRACT
  Filled 2015-02-25: qty 2.5

## 2015-02-25 MED ORDER — FESOTERODINE FUMARATE ER 4 MG PO TB24
4.0000 mg | ORAL_TABLET | Freq: Every day | ORAL | Status: DC
  Administered 2015-02-26 – 2015-02-28 (×3): 4 mg via ORAL
  Filled 2015-02-25 (×4): qty 1

## 2015-02-25 MED ORDER — ASPIRIN 81 MG PO CHEW
81.0000 mg | CHEWABLE_TABLET | Freq: Every day | ORAL | Status: DC
  Administered 2015-02-26 – 2015-02-28 (×3): 81 mg via ORAL
  Filled 2015-02-25 (×3): qty 1

## 2015-02-25 MED ORDER — GUAIFENESIN ER 600 MG PO TB12
600.0000 mg | ORAL_TABLET | Freq: Two times a day (BID) | ORAL | Status: DC
  Administered 2015-02-25 – 2015-02-28 (×6): 600 mg via ORAL
  Filled 2015-02-25 (×6): qty 1

## 2015-02-25 MED ORDER — BUDESONIDE-FORMOTEROL FUMARATE 160-4.5 MCG/ACT IN AERO
2.0000 | INHALATION_SPRAY | Freq: Two times a day (BID) | RESPIRATORY_TRACT | Status: DC
  Administered 2015-02-25 – 2015-02-28 (×6): 2 via RESPIRATORY_TRACT
  Filled 2015-02-25: qty 6

## 2015-02-25 MED ORDER — CETYLPYRIDINIUM CHLORIDE 0.05 % MT LIQD
7.0000 mL | Freq: Two times a day (BID) | OROMUCOSAL | Status: DC
  Administered 2015-02-25 – 2015-02-28 (×6): 7 mL via OROMUCOSAL

## 2015-02-25 MED ORDER — ISOSORBIDE DINITRATE 30 MG PO TABS
30.0000 mg | ORAL_TABLET | Freq: Every day | ORAL | Status: DC
  Administered 2015-02-26 – 2015-02-28 (×3): 30 mg via ORAL
  Filled 2015-02-25 (×4): qty 1

## 2015-02-25 MED ORDER — ACETAMINOPHEN 325 MG PO TABS
650.0000 mg | ORAL_TABLET | Freq: Four times a day (QID) | ORAL | Status: DC | PRN
  Administered 2015-02-25: 650 mg via ORAL
  Filled 2015-02-25: qty 2

## 2015-02-25 MED ORDER — METHYLPREDNISOLONE SODIUM SUCC 125 MG IJ SOLR
60.0000 mg | Freq: Four times a day (QID) | INTRAMUSCULAR | Status: DC
  Administered 2015-02-25 – 2015-02-27 (×8): 60 mg via INTRAVENOUS
  Filled 2015-02-25 (×8): qty 2

## 2015-02-25 MED ORDER — ALBUTEROL SULFATE (2.5 MG/3ML) 0.083% IN NEBU: 2.5000 mg | INHALATION_SOLUTION | RESPIRATORY_TRACT | Status: DC | PRN

## 2015-02-25 MED ORDER — DIVALPROEX SODIUM ER 250 MG PO TB24
250.0000 mg | ORAL_TABLET | Freq: Every day | ORAL | Status: DC
  Administered 2015-02-26 – 2015-02-28 (×3): 250 mg via ORAL
  Filled 2015-02-25 (×3): qty 1

## 2015-02-25 MED ORDER — TAMSULOSIN HCL 0.4 MG PO CAPS
0.4000 mg | ORAL_CAPSULE | Freq: Every day | ORAL | Status: DC
  Administered 2015-02-25 – 2015-02-27 (×3): 0.4 mg via ORAL
  Filled 2015-02-25 (×3): qty 1

## 2015-02-25 MED ORDER — SODIUM CHLORIDE 0.9 % IV SOLN
INTRAVENOUS | Status: DC
  Administered 2015-02-25: 19:00:00 via INTRAVENOUS

## 2015-02-25 MED ORDER — DIVALPROEX SODIUM ER 500 MG PO TB24
500.0000 mg | ORAL_TABLET | ORAL | Status: AC
  Administered 2015-02-25: 500 mg via ORAL
  Filled 2015-02-25: qty 1

## 2015-02-25 MED ORDER — ENOXAPARIN SODIUM 40 MG/0.4ML ~~LOC~~ SOLN
40.0000 mg | SUBCUTANEOUS | Status: DC
  Administered 2015-02-25 – 2015-02-27 (×3): 40 mg via SUBCUTANEOUS
  Filled 2015-02-25 (×3): qty 0.4

## 2015-02-25 MED ORDER — GUAIFENESIN 100 MG/5ML PO SOLN
200.0000 mg | ORAL | Status: DC | PRN
  Administered 2015-02-25: 200 mg via ORAL
  Filled 2015-02-25: qty 10

## 2015-02-25 MED ORDER — DEXTROSE 5 % IV SOLN
500.0000 mg | INTRAVENOUS | Status: DC
  Administered 2015-02-26: 500 mg via INTRAVENOUS
  Filled 2015-02-25: qty 500

## 2015-02-25 MED ORDER — DEXTROSE 5 % IV SOLN
500.0000 mg | Freq: Once | INTRAVENOUS | Status: AC
  Administered 2015-02-25: 500 mg via INTRAVENOUS
  Filled 2015-02-25: qty 500

## 2015-02-25 NOTE — ED Notes (Signed)
Asked pt for urine sample,states cant provide one at this time,Pt was given a urinal and asked to provide one when he is able.

## 2015-02-25 NOTE — ED Provider Notes (Signed)
CSN: [id number]     Arrival date & time 02/25/15  1332 History   First MD Initiated Contact with Patient 02/25/15 1350     Chief Complaint  Patient presents with  . Shortness of Breath     (Consider location/radiation/quality/duration/timing/severity/associated sxs/prior Treatment) Patient is a 81 y.o. male presenting with cough.  Cough Cough characteristics:  Productive Sputum characteristics:  Green Severity:  Moderate Onset quality:  Gradual Duration:  2 days Timing:  Constant Progression:  Worsening Chronicity:  Recurrent Context: upper respiratory infection   Relieved by:  Nothing Worsened by:  Nothing tried Ineffective treatments:  None tried Associated symptoms: shortness of breath   Associated symptoms: no chest pain and no fever     Past Medical History  Diagnosis Date  . Loss of height   . Compression fracture   . Hypertension   . Hypercholesterolemia   . Rhinitis   . Dyspnea   . Weight loss   . CAD (coronary artery disease)   . Peptic ulcer disease   . Alcohol abuse   . AMI (acute mesenteric ischemia)   . Colon polyps   . Pleurisy with effusion   . Grief reaction   . Macular degeneration   . Leukocytosis, unspecified 05/07/2014  . Bilateral leg edema 05/07/2014  . Recurrent falls 05/07/2014  . COPD (chronic obstructive pulmonary disease)    Past Surgical History  Procedure Laterality Date  . Exploratory laparotomy  1978    due to kidney problems  . Thoracotomy  1982    hemothorax  . Back surgery  2003    Cervical Spondylosis  . Spine surgery  2003    C-spine  . Cardiac catheterization  06/21/03  . Coronary stent placement  07/2003    x3  . Coronary angioplasty with stent placement  05/2004  . Colonoscopy w/ polypectomy  11/2006   Family History  Problem Relation Age of Onset  . Diabetes Father   . Colon cancer Mother   . Colon cancer Son    History  Substance Use Topics  . Smoking status: Former Smoker    Quit date: 10/10/1946  .  Smokeless tobacco: Never Used     Comment: cigars during college  . Alcohol Use: No     Comment: recovered ETOH abuser    Review of Systems  Constitutional: Negative for fever.  Respiratory: Positive for cough and shortness of breath.   Cardiovascular: Negative for chest pain.  All other systems reviewed and are negative.     Allergies  Cefprozil and Simvastatin  Home Medications   Prior to Admission medications   Medication Sig Start Date End Date Taking? Authorizing Provider  acetaminophen-codeine (TYLENOL #3) 300-30 MG per tablet Take 1 tablet by mouth every 6 (six) hours as needed (for pain).   Yes Historical Provider, MD  albuterol (PROVENTIL HFA;VENTOLIN HFA) 108 (90 BASE) MCG/ACT inhaler Inhale 1 puff into the lungs every 4 (four) hours as needed for wheezing (and chest tightness).    Yes Historical Provider, MD  aspirin 81 MG chewable tablet Chew 81 mg by mouth daily after breakfast.   Yes Historical Provider, MD  budesonide-formoterol (SYMBICORT) 160-4.5 MCG/ACT inhaler Inhale 2 puffs into the lungs 2 (two) times daily.   Yes Historical Provider, MD  chlorhexidine (PERIDEX) 0.12 % solution Use as directed 15 mLs in the mouth or throat 2 (two) times daily.   Yes Historical Provider, MD  chlorthalidone (HYGROTON) 25 MG tablet Take 12.5 mg by mouth every other day. For  10 days 02/18/15  Yes Historical Provider, MD  Cholecalciferol (VITAMIN D) 2000 UNITS CAPS Take 1 capsule by mouth daily with breakfast.   Yes Historical Provider, MD  divalproex (DEPAKOTE ER) 250 MG 24 hr tablet Take 250-500 mg by mouth 2 (two) times daily. Takes 250mg  in the morning at 0800 and 500mg  every evening at 2000   Yes Historical Provider, MD  flintstones complete (FLINTSTONES) 60 MG chewable tablet Chew 1 tablet by mouth daily after breakfast.   Yes Historical Provider, MD  furosemide (LASIX) 40 MG tablet Take 40 mg by mouth.   Yes Historical Provider, MD  GLUCERNA (GLUCERNA) LIQD Take 237 mLs by mouth  2 (two) times daily between meals.   Yes Historical Provider, MD  hydroxyurea (HYDREA) 500 MG capsule Take 1 capsule (500 mg total) by mouth daily. May take with food to minimize GI side effects. Take 1 pill daily except Saturdays and Sundays. Patient taking differently: Take 500 mg by mouth See admin instructions. Take 1 pill daily except Saturdays and Sundays. May take with food to minimize GI side effects. 11/10/14  Yes Heath Lark, MD  isosorbide dinitrate (ISORDIL) 30 MG tablet Take 30 mg by mouth daily after breakfast.  09/03/14  Yes Historical Provider, MD  LORazepam (ATIVAN) 0.5 MG tablet Take 0.25 mg by mouth every 12 (twelve) hours as needed for anxiety.    Yes Historical Provider, MD  metoprolol succinate (TOPROL-XL) 25 MG 24 hr tablet Take 1 tablet (25 mg total) by mouth daily. Patient taking differently: Take 25 mg by mouth daily after breakfast.  08/11/14  Yes Geradine Girt, DO  nitroGLYCERIN (NITROSTAT) 0.4 MG SL tablet Place 0.4 mg under the tongue every 5 (five) minutes as needed for chest pain.   Yes Historical Provider, MD  OXYGEN Inhale 2 L into the lungs at bedtime. Uses from 2000 to 0600   Yes Historical Provider, MD  pantoprazole (PROTONIX) 40 MG tablet Take 1 tablet (40 mg total) by mouth daily. 09/19/14  Yes Tanda Rockers, MD  polyethylene glycol powder (GLYCOLAX/MIRALAX) powder Take 17 g by mouth daily.    Yes Historical Provider, MD  QUEtiapine (SEROQUEL) 25 MG tablet Take 25 mg by mouth every evening.   Yes Historical Provider, MD  sertraline (ZOLOFT) 50 MG tablet Take 75 mg by mouth daily with breakfast.    Yes Historical Provider, MD  spironolactone (ALDACTONE) 25 MG tablet Take 12.5 mg by mouth daily with breakfast.    Yes Historical Provider, MD  tamsulosin (FLOMAX) 0.4 MG CAPS capsule Take 0.4 mg by mouth every evening.   Yes Historical Provider, MD  tiotropium (SPIRIVA) 18 MCG inhalation capsule Place 18 mcg into inhaler and inhale daily.   Yes Historical Provider, MD   tolterodine (DETROL LA) 2 MG 24 hr capsule Take 2 mg by mouth daily with breakfast.   Yes Historical Provider, MD  azithromycin (ZITHROMAX) 500 MG tablet Take 1 tablet (500 mg total) by mouth daily. Patient not taking: Reported on 02/25/2015 12/09/14   Heath Lark, MD  predniSONE (DELTASONE) 10 MG tablet Take 6 tablets (60 mg total) by mouth daily. Patient not taking: Reported on 02/25/2015 12/08/14   Jola Schmidt, MD   BP 114/51 mmHg  Pulse 75  Temp(Src) 99.6 F (37.6 C) (Oral)  Resp 22  SpO2 96% Physical Exam  Constitutional: He is oriented to person, place, and time. He appears well-developed and well-nourished.  HENT:  Head: Normocephalic and atraumatic.  Eyes: Conjunctivae and EOM are normal.  Neck: Normal range of motion. Neck supple.  Cardiovascular: Normal rate, regular rhythm and normal heart sounds.   Pulmonary/Chest: Effort normal. No respiratory distress. He has rales in the right lower field.  Abdominal: He exhibits no distension. There is no tenderness. There is no rebound and no guarding.  Musculoskeletal: Normal range of motion.  Neurological: He is alert and oriented to person, place, and time.  Skin: Skin is warm and dry.  Vitals reviewed.   ED Course  Procedures (including critical care time) Labs Review Labs Reviewed  CBC - Abnormal; Notable for the following:    WBC 3.4 (*)    RBC 3.03 (*)    Hemoglobin 10.9 (*)    HCT 33.0 (*)    MCV 108.9 (*)    MCH 36.0 (*)    RDW 18.3 (*)    Platelets 120 (*)    All other components within normal limits  BASIC METABOLIC PANEL - Abnormal; Notable for the following:    Glucose, Bld 113 (*)    BUN 28 (*)    Creatinine, Ser 1.47 (*)    Calcium 8.8 (*)    GFR calc non Af Amer 41 (*)    GFR calc Af Amer 48 (*)    All other components within normal limits  CULTURE, BLOOD (ROUTINE X 2)  CULTURE, BLOOD (ROUTINE X 2)  TROPONIN I  LACTIC ACID, PLASMA  URINALYSIS, ROUTINE W REFLEX MICROSCOPIC  LACTIC ACID, PLASMA  CBG  MONITORING, ED    Imaging Review Dg Chest 2 View  02/25/2015   CLINICAL DATA:  81 year old male with COPD and shortness of breath accompanied by productive cough  EXAM: CHEST  2 VIEW  COMPARISON:  Prior chest x-ray 12/15/2014.  FINDINGS: Increased patchy opacities in the right lung base on the frontal view which are difficult to localize on the lateral view background bronchitic change and upper lung predominant emphysema are similar compared to prior. Cardiac and mediastinal contours are unchanged and remain within normal limits. Trace atherosclerotic calcification again noted in the transverse aorta. No acute osseous abnormality. No pleural effusion or pneumothorax. Multilevel degenerative change throughout the spine.  IMPRESSION: 1. Increased patchy airspace opacity in the right lung base concerning for pneumonia given the clinical setting. Alternately, atelectasis could have a similar appearance.   Electronically Signed   By: Jacqulynn Cadet M.D.   On: 02/25/2015 14:29     EKG Interpretation   Date/Time:  Wednesday Feb 25 2015 13:53:04 EDT Ventricular Rate:  79 PR Interval:  171 QRS Duration: 89 QT Interval:  346 QTC Calculation: 397 R Axis:   64 Text Interpretation:  Sinus rhythm Low voltage, precordial leads  Borderline T wave abnormalities No significant change since last tracing  Confirmed by Debby Freiberg [phone]) on 02/25/2015 3:56:11 PM      MDM   Final diagnoses:  None    81 y.o. male with pertinent PMH of intermittent memory disturbance presents with cough, dyspnea as above.  Exam with R basilar rales.  Wu with CAP, 3L O2 required.  Admitted in stable condition.    I have reviewed all laboratory and imaging studies if ordered as above  1. CAP     Debby Freiberg, MD 02/25/15 [id number]

## 2015-02-25 NOTE — Progress Notes (Signed)
Pt arrived from ED on stretcher, slid to bed w/ 3 assist. Pt soaked in urine, shivering. Pt cleansed, condom cath applied with pt permission. Pt placed on tele 32, confirmed w/ CMT. VSS with elevated temp of 101.3. IV abx infusing, Tylenol given. Pt and son oriented to callbell and environment. POC discussed w/ both and confirmed w/ Dr Darrick Meigs.

## 2015-02-25 NOTE — ED Notes (Signed)
Made another request for urine,pt still unable to provide one at this time.

## 2015-02-25 NOTE — ED Notes (Signed)
Bed: JD05 Expected date:  Expected time:  Means of arrival:  Comments: EMS holding for 24

## 2015-02-25 NOTE — H&P (Signed)
PCP:   Hayden Rasmussen., MD   Chief Complaint:  Shortness of birth  HPI: 81 year old male who    has a past medical history of Loss of height; Compression fracture; Hypertension; Hypercholesterolemia; Rhinitis; Dyspnea; Weight loss; CAD (coronary artery disease); Peptic ulcer disease; Alcohol abuse; AMI (acute mesenteric ischemia); Colon polyps; Pleurisy with effusion; Grief reaction; Macular degeneration; Leukocytosis, unspecified (05/07/2014); Bilateral leg edema (05/07/2014); Recurrent falls (05/07/2014); and COPD (chronic obstructive pulmonary disease). Today patient was sent to the ED from the rest home for worsening shortness of breath. Patient's son found him in his room very lethargic and difficulty breathing. Patient also was coughing up yellow-colored phlegm. Denies any fever no chest pain no nausea vomiting or diarrhea. Patient had pneumonia diagnosed 2 months ago which was treated as outpatient patient has a history of CAD with angioplasty and stent placement in 2002 and 2005.  patient also has history of COPD and takes inhalers at home. Patient also has a history of myeloproliferative disease. In the ED chest x-ray was done which showed possible pneumonia and patient started on Rocephin and Zithromax.  Allergies:   Allergies  Allergen Reactions  . Cefprozil Other (See Comments)    REACTION: insomnia  . Simvastatin Other (See Comments)    REACTION: MUSCLE ACHES      Past Medical History  Diagnosis Date  . Loss of height   . Compression fracture   . Hypertension   . Hypercholesterolemia   . Rhinitis   . Dyspnea   . Weight loss   . CAD (coronary artery disease)   . Peptic ulcer disease   . Alcohol abuse   . AMI (acute mesenteric ischemia)   . Colon polyps   . Pleurisy with effusion   . Grief reaction   . Macular degeneration   . Leukocytosis, unspecified 05/07/2014  . Bilateral leg edema 05/07/2014  . Recurrent falls 05/07/2014  . COPD (chronic obstructive  pulmonary disease)     Past Surgical History  Procedure Laterality Date  . Exploratory laparotomy  1978    due to kidney problems  . Thoracotomy  1982    hemothorax  . Back surgery  2003    Cervical Spondylosis  . Spine surgery  2003    C-spine  . Cardiac catheterization  06/21/03  . Coronary stent placement  07/2003    x3  . Coronary angioplasty with stent placement  05/2004  . Colonoscopy w/ polypectomy  11/2006    Prior to Admission medications   Medication Sig Start Date End Date Taking? Authorizing Provider  acetaminophen-codeine (TYLENOL #3) 300-30 MG per tablet Take 1 tablet by mouth every 6 (six) hours as needed (for pain).   Yes Historical Provider, MD  albuterol (PROVENTIL HFA;VENTOLIN HFA) 108 (90 BASE) MCG/ACT inhaler Inhale 1 puff into the lungs every 4 (four) hours as needed for wheezing (and chest tightness).    Yes Historical Provider, MD  aspirin 81 MG chewable tablet Chew 81 mg by mouth daily after breakfast.   Yes Historical Provider, MD  budesonide-formoterol (SYMBICORT) 160-4.5 MCG/ACT inhaler Inhale 2 puffs into the lungs 2 (two) times daily.   Yes Historical Provider, MD  chlorhexidine (PERIDEX) 0.12 % solution Use as directed 15 mLs in the mouth or throat 2 (two) times daily.   Yes Historical Provider, MD  chlorthalidone (HYGROTON) 25 MG tablet Take 12.5 mg by mouth every other day. For 10 days 02/18/15  Yes Historical Provider, MD  Cholecalciferol (VITAMIN D) 2000 UNITS CAPS Take 1 capsule  by mouth daily with breakfast.   Yes Historical Provider, MD  divalproex (DEPAKOTE ER) 250 MG 24 hr tablet Take 250-500 mg by mouth 2 (two) times daily. Takes 250mg  in the morning at 0800 and 500mg  every evening at 2000   Yes Historical Provider, MD  flintstones complete (FLINTSTONES) 60 MG chewable tablet Chew 1 tablet by mouth daily after breakfast.   Yes Historical Provider, MD  furosemide (LASIX) 40 MG tablet Take 40 mg by mouth.   Yes Historical Provider, MD  GLUCERNA  (GLUCERNA) LIQD Take 237 mLs by mouth 2 (two) times daily between meals.   Yes Historical Provider, MD  hydroxyurea (HYDREA) 500 MG capsule Take 1 capsule (500 mg total) by mouth daily. May take with food to minimize GI side effects. Take 1 pill daily except Saturdays and Sundays. Patient taking differently: Take 500 mg by mouth See admin instructions. Take 1 pill daily except Saturdays and Sundays. May take with food to minimize GI side effects. 11/10/14  Yes Heath Lark, MD  isosorbide dinitrate (ISORDIL) 30 MG tablet Take 30 mg by mouth daily after breakfast.  09/03/14  Yes Historical Provider, MD  LORazepam (ATIVAN) 0.5 MG tablet Take 0.25 mg by mouth every 12 (twelve) hours as needed for anxiety.    Yes Historical Provider, MD  metoprolol succinate (TOPROL-XL) 25 MG 24 hr tablet Take 1 tablet (25 mg total) by mouth daily. Patient taking differently: Take 25 mg by mouth daily after breakfast.  08/11/14  Yes Geradine Girt, DO  nitroGLYCERIN (NITROSTAT) 0.4 MG SL tablet Place 0.4 mg under the tongue every 5 (five) minutes as needed for chest pain.   Yes Historical Provider, MD  OXYGEN Inhale 2 L into the lungs at bedtime. Uses from 2000 to 0600   Yes Historical Provider, MD  pantoprazole (PROTONIX) 40 MG tablet Take 1 tablet (40 mg total) by mouth daily. 09/19/14  Yes Tanda Rockers, MD  polyethylene glycol powder (GLYCOLAX/MIRALAX) powder Take 17 g by mouth daily.    Yes Historical Provider, MD  QUEtiapine (SEROQUEL) 25 MG tablet Take 25 mg by mouth every evening.   Yes Historical Provider, MD  sertraline (ZOLOFT) 50 MG tablet Take 75 mg by mouth daily with breakfast.    Yes Historical Provider, MD  spironolactone (ALDACTONE) 25 MG tablet Take 12.5 mg by mouth daily with breakfast.    Yes Historical Provider, MD  tamsulosin (FLOMAX) 0.4 MG CAPS capsule Take 0.4 mg by mouth every evening.   Yes Historical Provider, MD  tiotropium (SPIRIVA) 18 MCG inhalation capsule Place 18 mcg into inhaler and inhale  daily.   Yes Historical Provider, MD  tolterodine (DETROL LA) 2 MG 24 hr capsule Take 2 mg by mouth daily with breakfast.   Yes Historical Provider, MD  azithromycin (ZITHROMAX) 500 MG tablet Take 1 tablet (500 mg total) by mouth daily. Patient not taking: Reported on 02/25/2015 12/09/14   Heath Lark, MD  predniSONE (DELTASONE) 10 MG tablet Take 6 tablets (60 mg total) by mouth daily. Patient not taking: Reported on 02/25/2015 12/08/14   Jola Schmidt, MD    Social History:  reports that he quit smoking about 68 years ago. He has never used smokeless tobacco. He reports that he does not drink alcohol or use illicit drugs.  Family History  Problem Relation Age of Onset  . Diabetes Father   . Colon cancer Mother   . Colon cancer Son      All the positives are listed in BOLD  Review of Systems:  HEENT: Headache, blurred vision, runny nose, sore throat Neck: Hypothyroidism, hyperthyroidism,,lymphadenopathy Chest : Shortness of breath, history of COPD, Asthma Heart : Chest pain, history of coronary arterey disease GI:  Nausea, vomiting, diarrhea, constipation, GERD GU: Dysuria, urgency, frequency of urination, hematuria Neuro: Stroke, seizures, syncope Psych: Depression, anxiety, hallucinations   Physical Exam: Blood pressure 114/51, pulse 75, temperature 99.6 F (37.6 C), temperature source Oral, resp. rate 22, SpO2 96 %. Constitutional:   Patient is a well-developed and well-nourished male in no acute distress and cooperative with exam. Head: Normocephalic and atraumatic Mouth: Mucus membranes moist Eyes: PERRL, EOMI, conjunctivae normal Neck: Supple, No Thyromegaly Cardiovascular: RRR, S1 normal, S2 normal Pulmonary/Chest: Bilateral rhonchi Abdominal: Soft. Non-tender, non-distended, bowel sounds are normal, no masses, organomegaly, or guarding present.  Neurological: A&O x3, Strength is normal and symmetric bilaterally, cranial nerve II-XII are grossly intact, no focal motor  deficit, sensory intact to light touch bilaterally.  Extremities : No Cyanosis, Clubbing or Edema  Labs on Admission:  Basic Metabolic Panel:  Recent Labs Lab 02/25/15 1418  NA 140  K 4.3  CL 102  CO2 28  GLUCOSE 113*  BUN 28*  CREATININE 1.47*  CALCIUM 8.8*   Liver Function Tests: No results for input(s): AST, ALT, ALKPHOS, BILITOT, PROT, ALBUMIN in the last 168 hours. No results for input(s): LIPASE, AMYLASE in the last 168 hours. No results for input(s): AMMONIA in the last 168 hours. CBC:  Recent Labs Lab 02/25/15 1418  WBC 3.4*  HGB 10.9*  HCT 33.0*  MCV 108.9*  PLT 120*   Cardiac Enzymes:  Recent Labs Lab 02/25/15 1418  TROPONINI <0.03    BNP (last 3 results)  Recent Labs  12/08/14 1957  BNP 119.1*    ProBNP (last 3 results) No results for input(s): PROBNP in the last 8760 hours.  CBG:  Recent Labs Lab 02/25/15 1407  GLUCAP 84    Radiological Exams on Admission: Dg Chest 2 View  02/25/2015   CLINICAL DATA:  81 year old male with COPD and shortness of breath accompanied by productive cough  EXAM: CHEST  2 VIEW  COMPARISON:  Prior chest x-ray 12/15/2014.  FINDINGS: Increased patchy opacities in the right lung base on the frontal view which are difficult to localize on the lateral view background bronchitic change and upper lung predominant emphysema are similar compared to prior. Cardiac and mediastinal contours are unchanged and remain within normal limits. Trace atherosclerotic calcification again noted in the transverse aorta. No acute osseous abnormality. No pleural effusion or pneumothorax. Multilevel degenerative change throughout the spine.  IMPRESSION: 1. Increased patchy airspace opacity in the right lung base concerning for pneumonia given the clinical setting. Alternately, atelectasis could have a similar appearance.   Electronically Signed   By: Jacqulynn Cadet M.D.   On: 02/25/2015 14:29    EKG: Independently reviewed. Sinus rhythm      Assessment/Plan Active Problems:   CAP (community acquired pneumonia)   Pneumonia   COPD exacerbation   Community acquired pneumonia  Will start patient on Rocephin and Zithromax, follow blood cultures. Also obtain urine for Legionella antigen as well as Stepney 1 antigen.  COPD exacerbation We'll start the patient on Solu-Medrol 60 g IV every 6 hours, DuoNeb nebulizers every 6 hours, albuterol every 2 hours when necessary.  History of CAD Stable, continue Isordil, Aldactone, metoprolol, aspirin. Will hold the Lasix at this time.  Code status: Full code  Discussed with patient's son at bedside.  Time Spent on Admission: 55 min  [facility] Pager: [phone] 02/25/2015, 4:50 PM  If 7PM-7AM, please contact night-coverage  [email or web address]  Password [email or web address]

## 2015-02-25 NOTE — ED Notes (Signed)
In contrast to report from EMS, patient appears to have some memory problems, oriented to year, able to identify current president. Patient unable to identify current location, or month.

## 2015-02-25 NOTE — Progress Notes (Signed)
Utilization Review completed.  Harlem Thresher RN CM  

## 2015-02-25 NOTE — ED Notes (Addendum)
Per EMS pt with Hx of COPD from [facility] c/o SOB onset this morning, productive cough with brown/yellow sputum, lung sounds clear with normal respirations, rhonchi present during cough, pt warm to the touch. Per EMS, pt had 81% O2 on room air. Pt is alert and oriented x 4, no dementia. Pt states he wears oxygen at home while sleeping, does not wear oxygen while awake.

## 2015-02-26 DIAGNOSIS — J690 Pneumonitis due to inhalation of food and vomit: Principal | ICD-10-CM

## 2015-02-26 DIAGNOSIS — J449 Chronic obstructive pulmonary disease, unspecified: Secondary | ICD-10-CM

## 2015-02-26 DIAGNOSIS — D630 Anemia in neoplastic disease: Secondary | ICD-10-CM

## 2015-02-26 DIAGNOSIS — I2510 Atherosclerotic heart disease of native coronary artery without angina pectoris: Secondary | ICD-10-CM

## 2015-02-26 DIAGNOSIS — N179 Acute kidney failure, unspecified: Secondary | ICD-10-CM | POA: Diagnosis present

## 2015-02-26 DIAGNOSIS — T451X5A Adverse effect of antineoplastic and immunosuppressive drugs, initial encounter: Secondary | ICD-10-CM

## 2015-02-26 DIAGNOSIS — D471 Chronic myeloproliferative disease: Secondary | ICD-10-CM

## 2015-02-26 DIAGNOSIS — D61810 Antineoplastic chemotherapy induced pancytopenia: Secondary | ICD-10-CM

## 2015-02-26 DIAGNOSIS — J9620 Acute and chronic respiratory failure, unspecified whether with hypoxia or hypercapnia: Secondary | ICD-10-CM | POA: Diagnosis present

## 2015-02-26 DIAGNOSIS — J9621 Acute and chronic respiratory failure with hypoxia: Secondary | ICD-10-CM

## 2015-02-26 LAB — URINALYSIS, ROUTINE W REFLEX MICROSCOPIC
BILIRUBIN URINE: NEGATIVE
Glucose, UA: NEGATIVE mg/dL
Hgb urine dipstick: NEGATIVE
Ketones, ur: NEGATIVE mg/dL
NITRITE: NEGATIVE
PROTEIN: NEGATIVE mg/dL
SPECIFIC GRAVITY, URINE: 1.017 (ref 1.005–1.030)
Urobilinogen, UA: 2 mg/dL — ABNORMAL HIGH (ref 0.0–1.0)
pH: 7.5 (ref 5.0–8.0)

## 2015-02-26 LAB — URINE MICROSCOPIC-ADD ON

## 2015-02-26 LAB — COMPREHENSIVE METABOLIC PANEL
ALT: 22 U/L (ref 17–63)
AST: 32 U/L (ref 15–41)
Albumin: 3.3 g/dL — ABNORMAL LOW (ref 3.5–5.0)
Alkaline Phosphatase: 44 U/L (ref 38–126)
Anion gap: 11 (ref 5–15)
BUN: 28 mg/dL — ABNORMAL HIGH (ref 6–20)
CO2: 28 mmol/L (ref 22–32)
Calcium: 8.6 mg/dL — ABNORMAL LOW (ref 8.9–10.3)
Chloride: 101 mmol/L (ref 101–111)
Creatinine, Ser: 1.43 mg/dL — ABNORMAL HIGH (ref 0.61–1.24)
GFR calc Af Amer: 49 mL/min — ABNORMAL LOW (ref >60)
GFR, EST NON AFRICAN AMERICAN: 42 mL/min — AB (ref >60)
Glucose, Bld: 183 mg/dL — ABNORMAL HIGH (ref 65–99)
POTASSIUM: 4 mmol/L (ref 3.5–5.1)
SODIUM: 140 mmol/L (ref 135–145)
Total Bilirubin: 0.9 mg/dL (ref 0.3–1.2)
Total Protein: 5.5 g/dL — ABNORMAL LOW (ref 6.5–8.1)

## 2015-02-26 LAB — CBC
HEMATOCRIT: 32.4 % — AB (ref 39.0–52.0)
Hemoglobin: 10.5 g/dL — ABNORMAL LOW (ref 13.0–17.0)
MCH: 35.2 pg — AB (ref 26.0–34.0)
MCHC: 32.4 g/dL (ref 30.0–36.0)
MCV: 108.7 fL — AB (ref 78.0–100.0)
Platelets: 115 10*3/uL — ABNORMAL LOW (ref 150–400)
RBC: 2.98 MIL/uL — ABNORMAL LOW (ref 4.22–5.81)
RDW: 17.9 % — AB (ref 11.5–15.5)
WBC: 3.8 10*3/uL — ABNORMAL LOW (ref 4.0–10.5)

## 2015-02-26 LAB — INFLUENZA PANEL BY PCR (TYPE A & B)
H1N1 flu by pcr: NOT DETECTED
INFLBPCR: NEGATIVE
Influenza A By PCR: NEGATIVE

## 2015-02-26 LAB — STREP PNEUMONIAE URINARY ANTIGEN: STREP PNEUMO URINARY ANTIGEN: NEGATIVE

## 2015-02-26 MED ORDER — IPRATROPIUM-ALBUTEROL 0.5-2.5 (3) MG/3ML IN SOLN
3.0000 mL | Freq: Three times a day (TID) | RESPIRATORY_TRACT | Status: DC
  Administered 2015-02-26 – 2015-02-27 (×5): 3 mL via RESPIRATORY_TRACT
  Filled 2015-02-26 (×6): qty 3

## 2015-02-26 MED ORDER — CHLORHEXIDINE GLUCONATE 0.12 % MT SOLN
15.0000 mL | Freq: Two times a day (BID) | OROMUCOSAL | Status: DC
  Administered 2015-02-26 – 2015-02-28 (×5): 15 mL via OROMUCOSAL
  Filled 2015-02-26 (×5): qty 15

## 2015-02-26 NOTE — Evaluation (Signed)
Clinical/Bedside Swallow Evaluation Patient Details  Name: Daniel Ali MRN: [id number] Date of Birth: 06-07-1925  Today's Date: 02/26/2015 Time: SLP Start Time (ACUTE ONLY): 1145 SLP Stop Time (ACUTE ONLY): 1205 SLP Time Calculation (min) (ACUTE ONLY): 20 min  Past Medical History:  Past Medical History  Diagnosis Date  . Loss of height   . Compression fracture   . Hypertension   . Hypercholesterolemia   . Rhinitis   . Dyspnea   . Weight loss   . CAD (coronary artery disease)   . Peptic ulcer disease   . Alcohol abuse   . AMI (acute mesenteric ischemia)   . Colon polyps   . Pleurisy with effusion   . Grief reaction   . Macular degeneration   . Leukocytosis, unspecified 05/07/2014  . Bilateral leg edema 05/07/2014  . Recurrent falls 05/07/2014  . COPD (chronic obstructive pulmonary disease)    Past Surgical History:  Past Surgical History  Procedure Laterality Date  . Exploratory laparotomy  1978    due to kidney problems  . Thoracotomy  1982    hemothorax  . Back surgery  2003    Cervical Spondylosis  . Spine surgery  2003    C-spine  . Cardiac catheterization  06/21/03  . Coronary stent placement  07/2003    x3  . Coronary angioplasty with stent placement  05/2004  . Colonoscopy w/ polypectomy  2/20068   HPI:  81 year old male sent [facility] the ED [facility] the [facility] for worsening shortness of breath. Patient was coughing up yellow-colored phlegm. Patient had pneumonia diagnosed 2 months ago which was treated as outpatient. Patient also has history of COPD and takes inhalers at home.   Assessment / Plan / Recommendation Clinical Impression  Pt demonstrates inconsistent but immediate signs of aspiration with thin liquids, dependent of bolus size and rate of swallow. Given concern for new aspiration pna, will f/u with objective testing. Pt may continue diet with aspiration precautions, reinforced with pt and son.     Aspiration Risk  Moderate    Diet Recommendation   (regualr/thin)   Medication Administration: Whole meds with puree Compensations: Small sips/bites;Slow rate    Other  Recommendations Oral Care Recommendations: Oral care BID   Follow Up Recommendations       Frequency and Duration        Pertinent Vitals/Pain NA    SLP Swallow Goals     Swallow Study Prior Functional Status       General Other Pertinent Information: 81 year old male sent to the ED from the [facility]" for worsening shortness of breath. Patient was coughing up yellow-colored phlegm. Patient had pneumonia diagnosed 2 months ago which was treated as outpatient. Patient also has history of COPD and takes inhalers at home. Type of Study: Bedside swallow evaluation Previous Swallow Assessment: none Diet Prior to this Study: Regular;Thin liquids Temperature Spikes Noted: Yes Respiratory Status: Room air History of Recent Intubation: No Behavior/Cognition: Alert;Cooperative;Pleasant mood Oral Cavity - Dentition: Adequate natural dentition/normal for age Self-Feeding Abilities: Able to feed self Patient Positioning: Upright in bed Baseline Vocal Quality: Normal Volitional Cough: Strong Volitional Swallow: Able to elicit    Oral/Motor/Sensory Function Overall Oral Motor/Sensory Function: Appears within functional limits for tasks assessed   Ice Chips     Thin Liquid Thin Liquid: Impaired Presentation: Cup;Straw;Self Fed Pharyngeal  Phase Impairments: Cough - Immediate    Nectar Thick Nectar Thick Liquid: Not tested   Honey Thick Honey Thick Liquid: Not tested  Puree Puree: Within functional limits   Solid   GO    Solid: Not tested      Daniel [place], MA CCC-SLP [phone]  Daniel Ali 02/26/2015,1:06 PM

## 2015-02-26 NOTE — Progress Notes (Signed)
Progress Note   KRISTOPHER ATTWOOD PFX:[id number] DOB: 06-05-1925 DOA: 02/25/2015 PCP: Hayden Rasmussen., MD   Brief Narrative:   BOHDI LEEDS is an 81 y.o. male with a PMH of hypertension, COPD, CAD status post angioplasty and stents, myeloproliferative disease and alcohol abuse who was admitted from his SNF 02/25/15 with cough productive of yellow sputum, dyspnea and increased lethargy. He was treated for pneumonia approximately 2 months ago. Upon initial evaluation in the ED, chest x-ray showed increased patchy airspace opacity in the right lung base concerning for pneumonia. WBC was 3.8.  Assessment/Plan:   Principal Problem:   CAP (community acquired pneumonia) - Continue empiric Rocephin/azithromycin and antitussives. - Follow-up blood cultures. - Strep pneumonia antigen negative, Legionella pending. Influenza panel negative. - Given recurrent pneumonia, will ask ST to evaluate.  Active Problems:   Acute kidney injury, likely from diuretic therapy - Baseline creatinine 1.1-1.2.  Current creatinine is 1.43.  Diuretics on hold.    Essential hypertension - Lasix and chlorthalidone currently on hold. - Continue metoprolol.    Coronary atherosclerosis - Continue aspirin. Continue isosorbide and metoprolol.    COPD GOLD II / Acute on chronic respiratory failure - Continue bronchodilators. Continue Symbicort. - Continue Solu-Medrol, wean as tolerated. - Continue supplemental oxygen saturations 93-100%.  Uses nocturnal home oxygen.  - Flutter valve, IS ordered.    Myeloproliferative disease / anemia in neoplastic disease / pancytopenia - Hold Hydroxyurea given active infection.    DVT Prophylaxis - Continue Lovenox.  Code Status: Full. Family Communication: Jeri Modena, son at bedside. Disposition Plan: From [facility].   IV Access:    Peripheral IV   Procedures and diagnostic studies:   Dg Chest 2 View  02/25/2015   CLINICAL DATA:  81 year old male  with COPD and shortness of breath accompanied by productive cough  EXAM: CHEST  2 VIEW  COMPARISON:  Prior chest x-ray 12/15/2014.  FINDINGS: Increased patchy opacities in the right lung base on the frontal view which are difficult to localize on the lateral view background bronchitic change and upper lung predominant emphysema are similar compared to prior. Cardiac and mediastinal contours are unchanged and remain within normal limits. Trace atherosclerotic calcification again noted in the transverse aorta. No acute osseous abnormality. No pleural effusion or pneumothorax. Multilevel degenerative change throughout the spine.  IMPRESSION: 1. Increased patchy airspace opacity in the right lung base concerning for pneumonia given the clinical setting. Alternately, atelectasis could have a similar appearance.   Electronically Signed   By: Jacqulynn Cadet M.D.   On: 02/25/2015 14:29     Medical Consultants:    None.  Anti-Infectives:    Rocephin 02/25/15--->  Azithromycin 02/25/15--->  Subjective:   P[place] E [place] denies dyspnea, has occasional cough, non-productive.  Son thinks he is breathing more shallow than usual.  Objective:    Filed Vitals:   02/25/15 2049 02/25/15 2059 02/26/15 0534 02/26/15 0838  BP: 99/49  111/56   Pulse: 65  55   Temp: 99.8 F (37.7 C)  97 F (36.1 C)   TempSrc: Oral  Oral   Resp: 18  18   Height:      Weight:      SpO2: 97% 93% 100% 98%    Intake/Output Summary (Last 24 hours) at 02/26/15 0904 Last data filed at 02/26/15 0600  Gross per 24 hour  Intake    370 ml  Output    900 ml  Net   -530 ml  Exam: Gen:  NAD Cardiovascular:  RRR, No M/R/G Respiratory:  Lungs with basilar rhonchi Gastrointestinal:  Abdomen soft, NT/ND, + BS Extremities:  No C/E/C   Data Reviewed:    Labs: Basic Metabolic Panel:  Recent Labs Lab 02/25/15 1418 02/26/15 0524  NA 140 140  K 4.3 4.0  CL 102 101  CO2 28 28  GLUCOSE 113* 183*  BUN 28* 28*    CREATININE 1.47* 1.43*  CALCIUM 8.8* 8.6*   GFR Estimated Creatinine Clearance: 38.3 mL/min (by C-G formula based on Cr of 1.43). Liver Function Tests:  Recent Labs Lab 02/26/15 0524  AST 32  ALT 22  ALKPHOS 44  BILITOT 0.9  PROT 5.5*  ALBUMIN 3.3*   CBC:  Recent Labs Lab 02/25/15 1418 02/26/15 0524  WBC 3.4* 3.8*  HGB 10.9* 10.5*  HCT 33.0* 32.4*  MCV 108.9* 108.7*  PLT 120* 115*   Cardiac Enzymes:  Recent Labs Lab 02/25/15 1418  TROPONINI <0.03   CBG:  Recent Labs Lab 02/25/15 1407  GLUCAP 84   Sepsis Labs:  Recent Labs Lab 02/25/15 1418 02/25/15 1702 02/26/15 0524  WBC 3.4*  --  3.8*  LATICACIDVEN 1.4 1.3  --    Microbiology Recent Results (from the past 240 hour(s))  Blood culture (routine x 2)     Status: None (Preliminary result)   Collection Time: 02/25/15  4:57 PM  Result Value Ref Range Status   Specimen Description RIGHT ANTECUBITAL  Final   Special Requests BOTTLES DRAWN AEROBIC AND ANAEROBIC 5CC  Final   Culture   Final           BLOOD CULTURE RECEIVED NO GROWTH TO DATE CULTURE WILL BE HELD FOR 5 DAYS BEFORE ISSUING A FINAL NEGATIVE REPORT Performed at [facility]    Report Status PENDING  Incomplete     Medications:   . antiseptic oral rinse  7 mL Mouth Rinse BID  . aspirin  81 mg Oral QPC breakfast  . azithromycin  500 mg Intravenous Q24H  . budesonide-formoterol  2 puff Inhalation BID  . cefTRIAXone (ROCEPHIN)  IV  1 g Intravenous Q24H  . divalproex  250 mg Oral QAC breakfast  . enoxaparin (LOVENOX) injection  40 mg Subcutaneous Q24H  . fesoterodine  4 mg Oral Daily  . guaiFENesin  600 mg Oral BID  . hydroxyurea  500 mg Oral Once per day on Mon Tue Wed Thu Fri  . ipratropium-albuterol  3 mL Nebulization TID  . isosorbide dinitrate  30 mg Oral QPC breakfast  . methylPREDNISolone (SOLU-MEDROL) injection  60 mg Intravenous Q6H  . metoprolol succinate  25 mg Oral Daily  . pantoprazole  40 mg Oral Daily  .  QUEtiapine  25 mg Oral Q2000  . sertraline  75 mg Oral Q breakfast  . tamsulosin  0.4 mg Oral Q2000   Continuous Infusions: . sodium chloride 10 mL/hr at 02/25/15 1836    Time spent: 35 minutes with > 50% of time discussing current diagnostic test results, clinical impression and plan of care.   LOS: 1 day   RAMA,CHRISTINA  [facility] Pager [phone]. If unable to reach me by pager, please call my cell phone at [phone].  *Please refer to [email or web address], password [email or web address] to get updated schedule on who will round on this patient, as hospitalists switch teams weekly. If 7PM-7AM, please contact night-coverage at [email or web address], password [email or web address] for any overnight needs.  02/26/2015, 9:04 AM

## 2015-02-26 NOTE — Plan of Care (Signed)
Problem: Phase I Progression Outcomes Goal: Flu/PneumoVaccines if indicated Outcome: Completed/Met Date Met:  02/26/15 Flu vaccine negative. Droplet precautions d/c

## 2015-02-27 ENCOUNTER — Inpatient Hospital Stay (HOSPITAL_COMMUNITY): Payer: Medicare Other

## 2015-02-27 DIAGNOSIS — R4702 Dysphasia: Secondary | ICD-10-CM

## 2015-02-27 LAB — LEGIONELLA ANTIGEN, URINE

## 2015-02-27 MED ORDER — ONDANSETRON HCL 4 MG/2ML IJ SOLN
4.0000 mg | Freq: Three times a day (TID) | INTRAMUSCULAR | Status: DC | PRN
  Administered 2015-02-27: 4 mg via INTRAVENOUS
  Filled 2015-02-27: qty 2

## 2015-02-27 MED ORDER — METRONIDAZOLE 500 MG PO TABS
500.0000 mg | ORAL_TABLET | Freq: Three times a day (TID) | ORAL | Status: DC
  Administered 2015-02-27 – 2015-02-28 (×4): 500 mg via ORAL
  Filled 2015-02-27 (×4): qty 1

## 2015-02-27 MED ORDER — METHYLPREDNISOLONE SODIUM SUCC 40 MG IJ SOLR
40.0000 mg | Freq: Two times a day (BID) | INTRAMUSCULAR | Status: DC
  Administered 2015-02-28: 40 mg via INTRAVENOUS
  Filled 2015-02-27: qty 1

## 2015-02-27 NOTE — Evaluation (Signed)
Physical Therapy Evaluation Patient Details Name: Daniel Ali MRN: [id number] DOB: [birth year] Today's Date: 02/27/2015   History of Present Illness  81 yo male admitted with Pna. Hx of COPD, comp fx, HTN, ETOH ause, falls, macular degeneration. Pt is from an ALF  Clinical Impression  On eval, pt required Min assist for mobility-able to ambulate ~50 feet with straight cane. Unsteady at times requiring small amount of assist to stabilize/steady. Discussed possible need for pt to use RW initially once home until strength and balance improve. Recommend HHPT follow up at ALF    Follow Up Recommendations Home health PT    Equipment Recommendations  None recommended by PT    Recommendations for Other Services       Precautions / Restrictions Precautions Precautions: Fall Restrictions Weight Bearing Restrictions: No      Mobility  Bed Mobility Overal bed mobility: Needs Assistance Bed Mobility: Supine to Sit     Supine to sit: Min guard     General bed mobility comments: close guard for safety. Pt uses rocking/momentum to get to side of bed. Increased time.   Transfers Overall transfer level: Needs assistance   Transfers: Sit to/from Stand Sit to Stand: Min guard         General transfer comment: close guard for safety.   Ambulation/Gait Ambulation/Gait assistance: Min assist Ambulation Distance (Feet): 50 Feet Assistive device: Rolling walker (2 wheeled) Gait Pattern/deviations: Step-through pattern;Decreased stride length     General Gait Details: Intermittent assist needed to stabilize/steady-pt was aware of those few instances of LOB. O2 sats 97% on RA after walking.   Stairs            Wheelchair Mobility    Modified Rankin (Stroke Patients Only)       Balance Overall balance assessment: Needs assistance;History of Falls         Standing balance support: Single extremity supported;During functional activity Standing balance-Leahy  Scale: Fair                               Pertinent Vitals/Pain Pain Assessment: No/denies pain    Home Living Family/patient expects to be discharged to:: Assisted living               Home Equipment: Walker - 2 wheels;Cane - single point      Prior Function Level of Independence: Independent with assistive device(s)         Comments: normally uses cane but has RW available if needed     Hand Dominance        Extremity/Trunk Assessment   Upper Extremity Assessment: Overall WFL for tasks assessed           Lower Extremity Assessment: Generalized weakness      Cervical / Trunk Assessment: Kyphotic  Communication   Communication: No difficulties  Cognition Arousal/Alertness: Awake/alert Behavior During Therapy: WFL for tasks assessed/performed Overall Cognitive Status: Within Functional Limits for tasks assessed                      General Comments      Exercises        Assessment/Plan    PT Assessment Patient needs continued PT services  PT Diagnosis Difficulty walking;Generalized weakness   PT Problem List Decreased strength;Decreased activity tolerance;Decreased balance;Decreased mobility;Decreased knowledge of use of DME  PT Treatment Interventions DME instruction;Gait training;Functional mobility training;Therapeutic activities;Therapeutic exercise;Patient/family education;Balance training   PT  Goals (Current goals can be found in the Care Plan section) Acute Rehab PT Goals Patient Stated Goal: none stated PT Goal Formulation: With patient/family Time For Goal Achievement: 03/13/15 Potential to Achieve Goals: Good    Frequency Min 3X/week   Barriers to discharge        Co-evaluation               End of Session Equipment Utilized During Treatment: Gait belt Activity Tolerance: Patient tolerated treatment well Patient left: in chair;with call bell/phone within reach;with family/visitor present            Time: 0929-0958 PT Time Calculation (min) (ACUTE ONLY): 29 min   Charges:   PT Evaluation $Initial PT Evaluation Tier I: 1 Procedure PT Treatments $Gait Training: 8-22 mins   PT G Codes:        Weston Anna, MPT Pager: [phone]

## 2015-02-27 NOTE — Clinical Social Work Note (Signed)
Clinical Social Work Assessment  Patient Details  Name: Daniel Ali MRN: [id number] Date of Birth: December 22, 1924  Date of referral:  02/27/15               Reason for consult:  Facility Placement                Permission sought to share information with:  Facility Art therapist granted to share information::  Yes, Verbal Permission Granted  Name::        Agency::     Relationship::     Contact Information:     Housing/Transportation Living arrangements for the past 2 months:  [place] of Information:  Patient, Adult Children Patient Interpreter Needed:  None Criminal Activity/Legal Involvement Pertinent to Current Situation/Hospitalization:  No - Comment as needed Significant Relationships:  Adult Children, Friend Lives with:  Facility Resident Do you feel safe going back to the place where you live?  Yes Need for family participation in patient care:  Yes (Comment)  Care giving concerns:  CSW received consult that patient was admitted from [facility].    Social Worker assessment / plan:  CSW confirmed with patient & son, Richardson Landry at bedside that patient plans to return to [place] at discharge.   Employment status:  Retired Nurse, adult PT Recommendations:  Home with [place] / Referral to community resources:     Patient/Family's Response to care:  Patient states that he is glad he feels well enough to be able to return to [place] rather than going to SNF.   Patient/Family's Understanding of and Emotional Response to Diagnosis, Current Treatment, and Prognosis:  Patient's son expressed concern that he would return to [facility] too soon, patient had been in the hospital back in January for pneumonia and son felt he was discharged too soon.   Emotional Assessment Appearance:  Appears younger than stated age Attitude/Demeanor/Rapport:    Affect (typically observed):  Calm,  Pleasant Orientation:  Oriented to Self, Oriented to Place, Oriented to  Time, Oriented to Situation Alcohol / Substance use:    Psych involvement (Current and /or in the community):  No (Comment)  Discharge Needs  Concerns to be addressed:  Discharge Planning Concerns Readmission within the last 30 days:  No Current discharge risk:    Barriers to Discharge:      Standley Brooking, LCSW 02/27/2015, 10:31 AM

## 2015-02-27 NOTE — Progress Notes (Signed)
MBSS complete. Full report located under chart review in imaging section. Rosemary Pentecost, MA CCC-SLP [phone]  

## 2015-02-27 NOTE — Progress Notes (Signed)
Progress Note   DAMEIN GAUNCE FBP:[id number] DOB: [birth year] DOA: 02/25/2015 PCP: Hayden Rasmussen., MD   Brief Narrative:   QUINNTIN MALTER is an 81 y.o. male with a PMH of hypertension, COPD, CAD status post angioplasty and stents, myeloproliferative disease and alcohol abuse who was admitted from his SNF 02/25/15 with cough productive of yellow sputum, dyspnea and increased lethargy. He was treated for pneumonia approximately 2 months ago. Upon initial evaluation in the ED, chest x-ray showed increased patchy airspace opacity in the right lung base concerning for pneumonia. WBC was 3.8.  Assessment/Plan:   Principal Problem:   CAP (community acquired pneumonia) / Aspiration pneumonia secondary to moderate oral pharyngeal dysphasia - Change antibiotics to Rocephin/Flagyl given dysphagia, continue antitussives. - Blood cultures negative to date. - Strep pneumonia antigen negative, Legionella negative. Influenza panel negative. - Given recurrent pneumonia, speech therapy evaluated for dysphasia and found to have moderate dysphagia on MBS. - Strict aspiration precautions, diet per speech therapy recommendations.  Active Problems:   Acute kidney injury, likely from diuretic therapy - Baseline creatinine 1.1-1.2.  Current creatinine is 1.43.  Diuretics on hold.    Essential hypertension - Lasix and chlorthalidone currently on hold. - Continue metoprolol.    Coronary atherosclerosis - Continue aspirin. Continue isosorbide and metoprolol.    COPD GOLD II / Acute on chronic respiratory failure - Continue bronchodilators. Continue Symbicort. - Wean Solu-Medrol. - Continue supplemental oxygen saturations 93-100%.  Uses nocturnal home oxygen.  - Flutter valve, IS ordered.    Myeloproliferative disease / anemia in neoplastic disease / pancytopenia - Hold Hydroxyurea given active infection.    DVT Prophylaxis - Continue Lovenox.  Code Status: Full. Family Communication:  Jeri Modena, son at bedside. Disposition Plan: From [facility], likely will be stable to return their 02/28/15.   IV Access:    Peripheral IV   Procedures and diagnostic studies:   Dg Chest 2 View  02/25/2015   CLINICAL DATA:  81 year old male with COPD and shortness of breath accompanied by productive cough  EXAM: CHEST  2 VIEW  COMPARISON:  Prior chest x-ray 12/15/2014.  FINDINGS: Increased patchy opacities in the right lung base on the frontal view which are difficult to localize on the lateral view background bronchitic change and upper lung predominant emphysema are similar compared to prior. Cardiac and mediastinal contours are unchanged and remain within normal limits. Trace atherosclerotic calcification again noted in the transverse aorta. No acute osseous abnormality. No pleural effusion or pneumothorax. Multilevel degenerative change throughout the spine.  IMPRESSION: 1. Increased patchy airspace opacity in the right lung base concerning for pneumonia given the clinical setting. Alternately, atelectasis could have a similar appearance.   Electronically Signed   By: Jacqulynn Cadet M.D.   On: 02/25/2015 14:29     Medical Consultants:    None.  Anti-Infectives:    Rocephin 02/25/15--->  Azithromycin 02/25/15--->  Subjective:   [place] denies dyspnea, and is walking in the halls without difficulty. No chest pain. Occasional cough.  Objective:    Filed Vitals:   02/26/15 1452 02/26/15 2128 02/26/15 2247 02/27/15 0530  BP: 92/51  96/44 98/45  Pulse: 89  82 76  Temp: 97.6 F (36.4 C)  98 F (36.7 C) 98.1 F (36.7 C)  TempSrc: Oral  Oral Oral  Resp: 17  18 18   Height:      Weight:      SpO2: 98% 95% 94% 98%    Intake/Output Summary (Last  24 hours) at 02/27/15 1012 Last data filed at 02/27/15 0530  Gross per 24 hour  Intake    514 ml  Output    600 ml  Net    -86 ml    Exam: Gen:  NAD Cardiovascular:  RRR, No M/R/G Respiratory:  Lungs a  bit clearer today, rare rhonchi Gastrointestinal:  Abdomen soft, NT/ND, + BS Extremities:  No C/E/C   Data Reviewed:    Labs: Basic Metabolic Panel:  Recent Labs Lab 02/25/15 1418 02/26/15 0524  NA 140 140  K 4.3 4.0  CL 102 101  CO2 28 28  GLUCOSE 113* 183*  BUN 28* 28*  CREATININE 1.47* 1.43*  CALCIUM 8.8* 8.6*   GFR Estimated Creatinine Clearance: 38.3 mL/min (by C-G formula based on Cr of 1.43). Liver Function Tests:  Recent Labs Lab 02/26/15 0524  AST 32  ALT 22  ALKPHOS 44  BILITOT 0.9  PROT 5.5*  ALBUMIN 3.3*   CBC:  Recent Labs Lab 02/25/15 1418 02/26/15 0524  WBC 3.4* 3.8*  HGB 10.9* 10.5*  HCT 33.0* 32.4*  MCV 108.9* 108.7*  PLT 120* 115*   Cardiac Enzymes:  Recent Labs Lab 02/25/15 1418  TROPONINI <0.03   CBG:  Recent Labs Lab 02/25/15 1407  GLUCAP 84   Sepsis Labs:  Recent Labs Lab 02/25/15 1418 02/25/15 1702 02/26/15 0524  WBC 3.4*  --  3.8*  LATICACIDVEN 1.4 1.3  --    Microbiology Recent Results (from the past 240 hour(s))  Blood culture (routine x 2)     Status: None (Preliminary result)   Collection Time: 02/25/15  4:57 PM  Result Value Ref Range Status   Specimen Description RIGHT ANTECUBITAL  Final   Special Requests BOTTLES DRAWN AEROBIC AND ANAEROBIC 5CC  Final   Culture   Final           BLOOD CULTURE RECEIVED NO GROWTH TO DATE CULTURE WILL BE HELD FOR 5 DAYS BEFORE ISSUING A FINAL NEGATIVE REPORT Performed at [facility]    Report Status PENDING  Incomplete  Blood culture (routine x 2)     Status: None (Preliminary result)   Collection Time: 02/25/15  6:50 PM  Result Value Ref Range Status   Specimen Description BLOOD RIGHT HAND  Final   Special Requests AEB 3CC  Final   Culture   Final           BLOOD CULTURE RECEIVED NO GROWTH TO DATE CULTURE WILL BE HELD FOR 5 DAYS BEFORE ISSUING A FINAL NEGATIVE REPORT Performed at [facility]    Report Status PENDING  Incomplete      Medications:   . antiseptic oral rinse  7 mL Mouth Rinse BID  . aspirin  81 mg Oral QPC breakfast  . azithromycin  500 mg Intravenous Q24H  . budesonide-formoterol  2 puff Inhalation BID  . cefTRIAXone (ROCEPHIN)  IV  1 g Intravenous Q24H  . chlorhexidine  15 mL Mouth/Throat BID  . divalproex  250 mg Oral QAC breakfast  . enoxaparin (LOVENOX) injection  40 mg Subcutaneous Q24H  . fesoterodine  4 mg Oral Daily  . guaiFENesin  600 mg Oral BID  . ipratropium-albuterol  3 mL Nebulization TID  . isosorbide dinitrate  30 mg Oral QPC breakfast  . methylPREDNISolone (SOLU-MEDROL) injection  60 mg Intravenous Q6H  . metoprolol succinate  25 mg Oral Daily  . pantoprazole  40 mg Oral Daily  . QUEtiapine  25 mg Oral Q2000  .  sertraline  75 mg Oral Q breakfast  . tamsulosin  0.4 mg Oral Q2000   Continuous Infusions: . sodium chloride 10 mL/hr at 02/25/15 1836    Time spent: 35 minutes with > 50% of time discussing current diagnostic test results, clinical impression and plan of care.   LOS: 2 days   Kerly Rigsbee  [facility] Pager [phone]. If unable to reach me by pager, please call my cell phone at [phone].  *Please refer to [email or web address], password [email or web address] to get updated schedule on who will round on this patient, as hospitalists switch teams weekly. If 7PM-7AM, please contact night-coverage at [email or web address], password [email or web address] for any overnight needs.  02/27/2015, 10:12 AM

## 2015-02-28 LAB — CBC
HEMATOCRIT: 27.3 % — AB (ref 39.0–52.0)
HEMOGLOBIN: 9 g/dL — AB (ref 13.0–17.0)
MCH: 35.6 pg — ABNORMAL HIGH (ref 26.0–34.0)
MCHC: 33 g/dL (ref 30.0–36.0)
MCV: 107.9 fL — ABNORMAL HIGH (ref 78.0–100.0)
Platelets: 126 10*3/uL — ABNORMAL LOW (ref 150–400)
RBC: 2.53 MIL/uL — ABNORMAL LOW (ref 4.22–5.81)
RDW: 18.3 % — AB (ref 11.5–15.5)
WBC: 3.5 10*3/uL — ABNORMAL LOW (ref 4.0–10.5)

## 2015-02-28 LAB — BASIC METABOLIC PANEL
ANION GAP: 9 (ref 5–15)
BUN: 52 mg/dL — ABNORMAL HIGH (ref 6–20)
CHLORIDE: 101 mmol/L (ref 101–111)
CO2: 28 mmol/L (ref 22–32)
Calcium: 8.3 mg/dL — ABNORMAL LOW (ref 8.9–10.3)
Creatinine, Ser: 1.72 mg/dL — ABNORMAL HIGH (ref 0.61–1.24)
GFR calc Af Amer: 39 mL/min — ABNORMAL LOW (ref >60)
GFR, EST NON AFRICAN AMERICAN: 34 mL/min — AB (ref >60)
GLUCOSE: 166 mg/dL — AB (ref 65–99)
Potassium: 4.2 mmol/L (ref 3.5–5.1)
Sodium: 138 mmol/L (ref 135–145)

## 2015-02-28 MED ORDER — ACETAMINOPHEN 325 MG PO TABS: 650.0000 mg | ORAL_TABLET | Freq: Four times a day (QID) | ORAL | Status: AC | PRN

## 2015-02-28 MED ORDER — AMOXICILLIN-POT CLAVULANATE 875-125 MG PO TABS: 1.0000 | ORAL_TABLET | Freq: Two times a day (BID) | ORAL | Status: DC

## 2015-02-28 MED ORDER — ACETAMINOPHEN-CODEINE #3 300-30 MG PO TABS: 1.0000 | ORAL_TABLET | Freq: Four times a day (QID) | ORAL | Status: DC | PRN

## 2015-02-28 MED ORDER — LORAZEPAM 0.5 MG PO TABS: 0.2500 mg | ORAL_TABLET | Freq: Two times a day (BID) | ORAL | Status: AC | PRN

## 2015-02-28 MED ORDER — IPRATROPIUM-ALBUTEROL 0.5-2.5 (3) MG/3ML IN SOLN
3.0000 mL | Freq: Two times a day (BID) | RESPIRATORY_TRACT | Status: DC
  Filled 2015-02-28: qty 3

## 2015-02-28 MED ORDER — GUAIFENESIN ER 600 MG PO TB12: 600.0000 mg | ORAL_TABLET | Freq: Two times a day (BID) | ORAL | Status: AC

## 2015-02-28 MED ORDER — ALBUTEROL SULFATE (2.5 MG/3ML) 0.083% IN NEBU
2.5000 mg | INHALATION_SOLUTION | Freq: Four times a day (QID) | RESPIRATORY_TRACT | Status: DC | PRN
  Administered 2015-02-28: 2.5 mg via RESPIRATORY_TRACT
  Filled 2015-02-28: qty 3

## 2015-02-28 MED ORDER — PREDNISONE 20 MG PO TABS: 40.0000 mg | ORAL_TABLET | Freq: Every day | ORAL | Status: DC

## 2015-02-28 NOTE — Progress Notes (Signed)
Pt called for neb and mdi no distress noted at this time.

## 2015-02-28 NOTE — Progress Notes (Signed)
Rt went to given neb and mdi. Pt stated he wants to eat a little more however he is lying in bed sleeping.No distress noted at this time.

## 2015-02-28 NOTE — Progress Notes (Signed)
Order written for walker at discharge. Pt's son at bedside and told RN that pt has 2 walkers already. Will not order another at this time. Daniel Ali [facility]

## 2015-02-28 NOTE — Discharge Summary (Addendum)
Physician Discharge Summary  Daniel Ali TKZ:[id number] DOB: Mar 21, 1925 DOA: 02/25/2015  PCP: Hayden Rasmussen., MD  Admit date: 02/25/2015 Discharge date: 02/28/2015   Recommendations for Outpatient Follow-Up:   1. The patient is being discharged back to his ALF.  2. Please maintain STRICT aspiration precautions, pills should be given whole in puree. 3. Please encourage deep breathing exercises. 4. Continue home oxygen at HS. 5. Recommend F/U BMET in 1 week to assess renal function, diuretics on hold secondary to AKI.   6. F/U final blood culture results, negative to date.   Discharge Diagnosis:   Principal Problem:    CAP (community acquired pneumonia)/aspiration pneumonia Active Problems:    Essential hypertension    Coronary atherosclerosis    COPD GOLD II    Myeloproliferative disease    Anemia in neoplastic disease    AKI (acute kidney injury)    Acute-on-chronic respiratory failure    Antineoplastic chemotherapy induced pancytopenia    Dysphasia   Discharge disposition:  ALF:  [facility].  Discharge Condition: Improved.  Diet recommendation: Low sodium, heart healthy.  Thin liquids.  Meds given whole in pureed.    History of Present Illness:   ALEE Ali is an 81 y.o. male with a PMH of hypertension, COPD, CAD status post angioplasty and stents, myeloproliferative disease and alcohol abuse who was admitted from his SNF 02/25/15 with cough productive of yellow sputum, dyspnea and increased lethargy. He was treated for pneumonia approximately 2 months ago. Upon initial evaluation in the ED, chest x-ray showed increased patchy airspace opacity in the right lung base concerning for pneumonia. WBC was 3.8.   Hospital Course by Problem:   Principal Problem:  CAP (community acquired pneumonia) / Aspiration pneumonia secondary to moderate oral pharyngeal dysphasia - Blood cultures negative. Strep pneumonia antigen negative, Legionella  negative. Influenza panel negative. - Given recurrent pneumonia, speech therapy evaluated for dysphasia and found to have moderate dysphagia on MBS. - Strict aspiration precautions, diet per speech therapy recommendations. - Treated with Rocephin/Flagyl to cover aspiration pneumonia given dysphagia, d/c on 7 days Augmentin.  Active Problems:  Acute kidney injury, likely from diuretic therapy - Baseline creatinine 1.1-1.2. Current creatinine is 1.72. Diuretics on hold.   Essential hypertension - Lasix, Spironolactone and chlorthalidone currently on hold. - Continue metoprolol.   Coronary atherosclerosis - Continue aspirin. Continue isosorbide and metoprolol.   COPD GOLD II / Acute on chronic respiratory failure - Continue bronchodilators. Resume Spiriva. - S/P steroid taper. - Continue supplemental oxygen saturations 93-100%. Uses nocturnal home oxygen.  - Flutter valve, IS ordered.   Myeloproliferative disease / anemia in neoplastic disease / pancytopenia - Held Hydroxyurea given active infection.  Scheduled to resume 03/03/15.    Medical Consultants:    None.   Discharge Exam:   Filed Vitals:   02/28/15 0617  BP: 114/56  Pulse: 80  Temp: 97.4 F (36.3 C)  Resp: 18   Filed Vitals:   02/27/15 0530 02/27/15 1300 02/27/15 2207 02/28/15 0617  BP: 98/45 103/57 117/67 114/56  Pulse: 76 79 79 80  Temp: 98.1 F (36.7 C) 98.7 F (37.1 C) 98.7 F (37.1 C) 97.4 F (36.3 C)  TempSrc: Oral Oral Oral Oral  Resp: 18 18 18 18   Height:      Weight:      SpO2: 98% 96% 95% 94%    Gen:  NAD Cardiovascular:  RRR, No M/R/G Respiratory: Lungs with faint scattered rhonchi Gastrointestinal: Abdomen soft, NT/ND with normal active bowel sounds.  Extremities: No C/E/C   The results of significant diagnostics from this hospitalization (including imaging, microbiology, ancillary and laboratory) are listed below for reference.     Procedures and Diagnostic Studies:   Dg  Chest 2 View  02/25/2015   CLINICAL DATA:  81 year old male with COPD and shortness of breath accompanied by productive cough  EXAM: CHEST  2 VIEW  COMPARISON:  Prior chest x-ray 12/15/2014.  FINDINGS: Increased patchy opacities in the right lung base on the frontal view which are difficult to localize on the lateral view background bronchitic change and upper lung predominant emphysema are similar compared to prior. Cardiac and mediastinal contours are unchanged and remain within normal limits. Trace atherosclerotic calcification again noted in the transverse aorta. No acute osseous abnormality. No pleural effusion or pneumothorax. Multilevel degenerative change throughout the spine.  IMPRESSION: 1. Increased patchy airspace opacity in the right lung base concerning for pneumonia given the clinical setting. Alternately, atelectasis could have a similar appearance.   Electronically Signed   By: Jacqulynn Cadet M.D.   On: 02/25/2015 14:29   Dg Swallowing Func-speech Pathology  02/27/2015    Objective Swallowing Evaluation:    Patient Details  Name: Daniel Ali MRN: [id number] Date of Birth: Jan 29, 1925  Today's Date: 02/27/2015 Time: SLP Start Time (ACUTE ONLY): 0900-SLP Stop Time (ACUTE ONLY): 0930 SLP Time Calculation (min) (ACUTE ONLY): 30 min  Past Medical History:  Past Medical History  Diagnosis Date  . Loss of height   . Compression fracture   . Hypertension   . Hypercholesterolemia   . Rhinitis   . Dyspnea   . Weight loss   . CAD (coronary artery disease)   . Peptic ulcer disease   . Alcohol abuse   . AMI (acute mesenteric ischemia)   . Colon polyps   . Pleurisy with effusion   . Grief reaction   . Macular degeneration   . Leukocytosis, unspecified 05/07/2014  . Bilateral leg edema 05/07/2014  . Recurrent falls 05/07/2014  . COPD (chronic obstructive pulmonary disease)    Past Surgical History:  Past Surgical History  Procedure Laterality Date  . Exploratory laparotomy  1978    due to kidney problems  .  Thoracotomy  1982    hemothorax  . Back surgery  2003    Cervical Spondylosis  . Spine surgery  2003    C-spine  . Cardiac catheterization  06/21/03  . Coronary stent placement  07/2003    x3  . Coronary angioplasty with stent placement  05/2004  . Colonoscopy w/ polypectomy  11/2006   HPI:  Other Pertinent Information: 81 year old male sent to the ED from the  "rest home" for worsening shortness of breath. Patient was coughing up  yellow-colored phlegm. Patient had pneumonia diagnosed 2 months ago which  was treated as outpatient. Patient also has history of COPD and takes  inhalers at home.  No Data Recorded  Assessment / Plan / Recommendation CHL IP CLINICAL IMPRESSIONS 02/27/2015  Therapy Diagnosis (None)  Clinical Impression Moderate oropharyngeal dysphagia characterized by  sensory deficits resulting in delayed swallow initiation and reduced  laryngeal sensation. When any moderate or large sip falls to pyriforms  post swallow there is trace silent penetration and aspiration. A chin tuck  widens vallecular space to capture bolus and prevent penetration. There is  still chance of trace penetration post swallow from mild oral residuals  that fall to the valleculae and can spill to airway as pt does not  independently clear.  Recommend pt continue a regular diet with thin  liquids, but with a chin tuck and occasional second swallow. F/u with home  Health therapy is also recommended.       CHL IP TREATMENT RECOMMENDATION 02/27/2015  Treatment Recommendations Therapy as outlined in treatment plan below     CHL IP DIET RECOMMENDATION 02/27/2015  SLP Diet Recommendations (No Data)  Liquid Administration via (None)  Medication Administration Whole meds with puree  Compensations Chin tuck;Multiple dry swallows after each bite/sip  Postural Changes and/or Swallow Maneuvers (None)     CHL IP OTHER RECOMMENDATIONS 02/27/2015  Recommended Consults (None)  Oral Care Recommendations Oral care BID  Other Recommendations (None)     No  flowsheet data found.   CHL IP FREQUENCY AND DURATION 02/27/2015  Speech Therapy Frequency (ACUTE ONLY) min 2x/week  Treatment Duration 2 weeks     Pertinent Vitals/Pain NA    SLP Swallow Goals No flowsheet data found.  No flowsheet data found.    CHL IP REASON FOR REFERRAL 02/27/2015  Reason for Referral Objectively evaluate swallowing function     CHL IP ORAL PHASE 02/27/2015  Lips (None)  Tongue (None)  Mucous membranes (None)  Nutritional status (None)  Other (None)  Oxygen therapy (None)  Oral Phase Impaired  Oral - Pudding Teaspoon (None)  Oral - Pudding Cup (None)  Oral - Honey Teaspoon (None)  Oral - Honey Cup (None)  Oral - Honey Syringe (None)  Oral - Nectar Teaspoon (None)  Oral - Nectar Cup (None)  Oral - Nectar Straw (None)  Oral - Nectar Syringe (None)  Oral - Ice Chips (None)  Oral - Thin Teaspoon (None)  Oral - Thin Cup (None)  Oral - Thin Straw (None)  Oral - Thin Syringe (None)  Oral - Puree (None)  Oral - Mechanical Soft (None)  Oral - Regular (None)  Oral - Multi-consistency (None)  Oral - Pill (None)  Oral Phase - Comment (None)      CHL IP PHARYNGEAL PHASE 02/27/2015  Pharyngeal Phase Impaired  Pharyngeal - Pudding Teaspoon (None)  Penetration/Aspiration details (pudding teaspoon) (None)  Pharyngeal - Pudding Cup (None)  Penetration/Aspiration details (pudding cup) (None)  Pharyngeal - Honey Teaspoon (None)  Penetration/Aspiration details (honey teaspoon) (None)  Pharyngeal - Honey Cup (None)  Penetration/Aspiration details (honey cup) (None)  Pharyngeal - Honey Syringe (None)  Penetration/Aspiration details (honey syringe) (None)  Pharyngeal - Nectar Teaspoon (None)  Penetration/Aspiration details (nectar teaspoon) (None)  Pharyngeal - Nectar Cup (None)  Penetration/Aspiration details (nectar cup) (None)  Pharyngeal - Nectar Straw (None)  Penetration/Aspiration details (nectar straw) (None)  Pharyngeal - Nectar Syringe (None)  Penetration/Aspiration details (nectar syringe) (None)  Pharyngeal -  Ice Chips (None)  Penetration/Aspiration details (ice chips) (None)  Pharyngeal - Thin Teaspoon (None)  Penetration/Aspiration details (thin teaspoon) (None)  Pharyngeal - Thin Cup (None)  Penetration/Aspiration details (thin cup) (None)  Pharyngeal - Thin Straw (None)  Penetration/Aspiration details (thin straw) (None)  Pharyngeal - Thin Syringe (None)  Penetration/Aspiration details (thin syringe') (None)  Pharyngeal - Puree (None)  Penetration/Aspiration details (puree) (None)  Pharyngeal - Mechanical Soft (None)  Penetration/Aspiration details (mechanical soft) (None)  Pharyngeal - Regular (None)  Penetration/Aspiration details (regular) (None)  Pharyngeal - Multi-consistency (None)  Penetration/Aspiration details (multi-consistency) (None)  Pharyngeal - Pill (None)  Penetration/Aspiration details (pill) (None)  Pharyngeal Comment (None)      No flowsheet data found.  No flowsheet data found.        Herbie Baltimore, Effie CCC-SLP [phone]  Lynann Beaver 02/27/2015, 10:11 AM      Labs:   Basic Metabolic Panel:  Recent Labs Lab 02/25/15 1418 02/26/15 0524 02/28/15 0518  NA 140 140 138  K 4.3 4.0 4.2  CL 102 101 101  CO2 28 28 28   GLUCOSE 113* 183* 166*  BUN 28* 28* 52*  CREATININE 1.47* 1.43* 1.72*  CALCIUM 8.8* 8.6* 8.3*   GFR Estimated Creatinine Clearance: 31.9 mL/min (by C-G formula based on Cr of 1.72). Liver Function Tests:  Recent Labs Lab 02/26/15 0524  AST 32  ALT 22  ALKPHOS 44  BILITOT 0.9  PROT 5.5*  ALBUMIN 3.3*   CBC:  Recent Labs Lab 02/25/15 1418 02/26/15 0524 02/28/15 0518  WBC 3.4* 3.8* 3.5*  HGB 10.9* 10.5* 9.0*  HCT 33.0* 32.4* 27.3*  MCV 108.9* 108.7* 107.9*  PLT 120* 115* 126*   Cardiac Enzymes:  Recent Labs Lab 02/25/15 1418  TROPONINI <0.03   CBG:  Recent Labs Lab 02/25/15 1407  GLUCAP 84   Microbiology Recent Results (from the past 240 hour(s))  Blood culture (routine x 2)     Status: None (Preliminary result)    Collection Time: 02/25/15  4:57 PM  Result Value Ref Range Status   Specimen Description RIGHT ANTECUBITAL  Final   Special Requests BOTTLES DRAWN AEROBIC AND ANAEROBIC 5CC  Final   Culture   Final           BLOOD CULTURE RECEIVED NO GROWTH TO DATE CULTURE WILL BE HELD FOR 5 DAYS BEFORE ISSUING A FINAL NEGATIVE REPORT Performed at [facility]    Report Status PENDING  Incomplete  Blood culture (routine x 2)     Status: None (Preliminary result)   Collection Time: 02/25/15  6:50 PM  Result Value Ref Range Status   Specimen Description BLOOD RIGHT HAND  Final   Special Requests AEB 3CC  Final   Culture   Final           BLOOD CULTURE RECEIVED NO GROWTH TO DATE CULTURE WILL BE HELD FOR 5 DAYS BEFORE ISSUING A FINAL NEGATIVE REPORT Performed at [facility]    Report Status PENDING  Incomplete     Discharge Instructions:   Discharge Instructions    Call MD for:  extreme fatigue    Complete by:  As directed      Call MD for:  persistant nausea and vomiting    Complete by:  As directed      Call MD for:  temperature >100.4    Complete by:  As directed      Diet - low sodium heart healthy    Complete by:  As directed      Increase activity slowly    Complete by:  As directed      Walk with assistance    Complete by:  As directed      Walker     Complete by:  As directed             Medication List    STOP taking these medications        azithromycin 500 MG tablet  Commonly known as:  ZITHROMAX     chlorthalidone 25 MG tablet  Commonly known as:  HYGROTON     furosemide 40 MG tablet  Commonly known as:  LASIX     predniSONE 10 MG tablet  Commonly known as:  DELTASONE     spironolactone 25 MG tablet  Commonly known as:  ALDACTONE      TAKE these medications        acetaminophen 325 MG tablet  Commonly known as:  TYLENOL  Take 2 tablets (650 mg total) by mouth every 6 (six) hours as needed for mild pain (or Fever >/= 101).      acetaminophen-codeine 300-30 MG per tablet  Commonly known as:  TYLENOL #3  Take 1 tablet by mouth every 6 (six) hours as needed (for pain).     albuterol 108 (90 BASE) MCG/ACT inhaler  Commonly known as:  PROVENTIL HFA;VENTOLIN HFA  Inhale 1 puff into the lungs every 4 (four) hours as needed for wheezing (and chest tightness).     amoxicillin-clavulanate 875-125 MG per tablet  Commonly known as:  AUGMENTIN  Take 1 tablet by mouth 2 (two) times daily.     aspirin 81 MG chewable tablet  Chew 81 mg by mouth daily after breakfast.     budesonide-formoterol 160-4.5 MCG/ACT inhaler  Commonly known as:  SYMBICORT  Inhale 2 puffs into the lungs 2 (two) times daily.     chlorhexidine 0.12 % solution  Commonly known as:  PERIDEX  Use as directed 15 mLs in the mouth or throat 2 (two) times daily.     divalproex 250 MG 24 hr tablet  Commonly known as:  DEPAKOTE ER  Take 250-500 mg by mouth 2 (two) times daily. Takes 250mg  in the morning at 0800 and 500mg  every evening at 2000     flintstones complete 60 MG chewable tablet  Chew 1 tablet by mouth daily after breakfast.     GLUCERNA Liqd  Take 237 mLs by mouth 2 (two) times daily between meals.     guaiFENesin 600 MG 12 hr tablet  Commonly known as:  MUCINEX  Take 1 tablet (600 mg total) by mouth 2 (two) times daily.     hydroxyurea 500 MG capsule  Commonly known as:  HYDREA  Take 1 capsule (500 mg total) by mouth daily. May take with food to minimize GI side effects. Take 1 pill daily except Saturdays and Sundays.     isosorbide dinitrate 30 MG tablet  Commonly known as:  ISORDIL  Take 30 mg by mouth daily after breakfast.     LORazepam 0.5 MG tablet  Commonly known as:  ATIVAN  Take 0.5 tablets (0.25 mg total) by mouth every 12 (twelve) hours as needed for anxiety.     metoprolol succinate 25 MG 24 hr tablet  Commonly known as:  TOPROL-XL  Take 1 tablet (25 mg total) by mouth daily.     nitroGLYCERIN 0.4 MG SL tablet    Commonly known as:  NITROSTAT  Place 0.4 mg under the tongue every 5 (five) minutes as needed for chest pain.     OXYGEN  Inhale 2 L into the lungs at bedtime. Uses from 2000 to 0600     pantoprazole 40 MG tablet  Commonly known as:  PROTONIX  Take 1 tablet (40 mg total) by mouth daily.     polyethylene glycol powder powder  Commonly known as:  GLYCOLAX/MIRALAX  Take 17 g by mouth daily.     QUEtiapine 25 MG tablet  Commonly known as:  SEROQUEL  Take 25 mg by mouth every evening.     sertraline 50 MG tablet  Commonly known as:  ZOLOFT  Take 75 mg by mouth daily with breakfast.     tamsulosin 0.4 MG Caps capsule  Commonly known as:  FLOMAX  Take  0.4 mg by mouth every evening.     tiotropium 18 MCG inhalation capsule  Commonly known as:  SPIRIVA  Place 18 mcg into inhaler and inhale daily.     tolterodine 2 MG 24 hr capsule  Commonly known as:  DETROL LA  Take 2 mg by mouth daily with breakfast.     Vitamin D 2000 UNITS Caps  Take 1 capsule by mouth daily with breakfast.           Follow-up Information    Follow up with Horald Pollen L., MD. Schedule an appointment as soon as possible for a visit in 1 week.   Specialty:  Family Medicine   Why:  Hospital follow up   Contact information:   [address] [place] [address] [phone]        Time coordinating discharge: 35 minutes.  Signed:  RAMA,CHRISTINA  Pager [phone] [facility] 02/28/2015, 10:14 AM

## 2015-03-03 LAB — CULTURE, BLOOD (ROUTINE X 2): Culture: NO GROWTH

## 2015-03-04 LAB — CULTURE, BLOOD (ROUTINE X 2): CULTURE: NO GROWTH

## 2015-03-12 ENCOUNTER — Telehealth: Payer: Self-pay | Admitting: Hematology and Oncology

## 2015-03-12 ENCOUNTER — Encounter: Payer: Self-pay | Admitting: Hematology and Oncology

## 2015-03-12 ENCOUNTER — Ambulatory Visit (HOSPITAL_BASED_OUTPATIENT_CLINIC_OR_DEPARTMENT_OTHER): Payer: Medicare Other | Admitting: Hematology and Oncology

## 2015-03-12 ENCOUNTER — Other Ambulatory Visit (HOSPITAL_BASED_OUTPATIENT_CLINIC_OR_DEPARTMENT_OTHER): Payer: Medicare Other

## 2015-03-12 VITALS — BP 98/38 | HR 63 | Temp 97.9°F | Resp 18 | Ht 68.0 in | Wt 194.8 lb

## 2015-03-12 DIAGNOSIS — D630 Anemia in neoplastic disease: Secondary | ICD-10-CM | POA: Diagnosis not present

## 2015-03-12 DIAGNOSIS — D649 Anemia, unspecified: Secondary | ICD-10-CM

## 2015-03-12 DIAGNOSIS — D471 Chronic myeloproliferative disease: Secondary | ICD-10-CM

## 2015-03-12 DIAGNOSIS — R1310 Dysphagia, unspecified: Secondary | ICD-10-CM | POA: Diagnosis not present

## 2015-03-12 DIAGNOSIS — C946 Myelodysplastic disease, not classified: Secondary | ICD-10-CM

## 2015-03-12 DIAGNOSIS — R600 Localized edema: Secondary | ICD-10-CM | POA: Diagnosis not present

## 2015-03-12 DIAGNOSIS — R609 Edema, unspecified: Secondary | ICD-10-CM

## 2015-03-12 DIAGNOSIS — I959 Hypotension, unspecified: Secondary | ICD-10-CM

## 2015-03-12 DIAGNOSIS — I952 Hypotension due to drugs: Secondary | ICD-10-CM

## 2015-03-12 HISTORY — DX: Dysphagia, unspecified: R13.10

## 2015-03-12 LAB — CBC WITH DIFFERENTIAL/PLATELET
BASO%: 0.5 % (ref 0.0–2.0)
Basophils Absolute: 0 10*3/uL (ref 0.0–0.1)
EOS ABS: 0 10*3/uL (ref 0.0–0.5)
EOS%: 0.6 % (ref 0.0–7.0)
HEMATOCRIT: 29 % — AB (ref 38.4–49.9)
HGB: 9.7 g/dL — ABNORMAL LOW (ref 13.0–17.1)
LYMPH%: 26.3 % (ref 14.0–49.0)
MCH: 36.7 pg — ABNORMAL HIGH (ref 27.2–33.4)
MCHC: 33.6 g/dL (ref 32.0–36.0)
MCV: 109.3 fL — AB (ref 79.3–98.0)
MONO#: 0.2 10*3/uL (ref 0.1–0.9)
MONO%: 8.4 % (ref 0.0–14.0)
NEUT#: 1.7 10*3/uL (ref 1.5–6.5)
NEUT%: 64.2 % (ref 39.0–75.0)
PLATELETS: 163 10*3/uL (ref 140–400)
RBC: 2.65 10*6/uL — ABNORMAL LOW (ref 4.20–5.82)
RDW: 17.4 % — AB (ref 11.0–14.6)
WBC: 2.7 10*3/uL — ABNORMAL LOW (ref 4.0–10.3)
lymph#: 0.7 10*3/uL — ABNORMAL LOW (ref 0.9–3.3)

## 2015-03-12 NOTE — Telephone Encounter (Signed)
per pof to sch pt appt-gave tp copy of sch °

## 2015-03-12 NOTE — Telephone Encounter (Signed)
per pof to sch pt appt-cld speech per referral and adv of referral-Kizzy stated they will sch from [facility] and call pt-adv pt son-gave copy of sch

## 2015-03-13 NOTE — Progress Notes (Signed)
[facility] OFFICE PROGRESS NOTE  Patient Care Team: Hayden Rasmussen, MD as PCP - General (Family Medicine)  SUMMARY OF ONCOLOGIC HISTORY:  He was found to have abnormal CBC from recent CBC monitoring when he saw his primary care provider for recurrent falls. This patient had history disease, with angioplasty and stent placement in 2002 and 2005.  In May of this year, he fell at [employer]. After a detailed history, he stated that he had misjudged the distance between his hands and railing and fell. He did not sustain major injury but his CBC showed mild leukocytosis, anemia and very high platelet count, platelet count over 800,000. According to his son, the patient may have transient ischemic attack with mild weakness which subsequently resolved. A week ago, he had accident and had broken ribs. He had CT imaging study which confirmed retractors. Repeat CBC showed platelet count over 1 million. He is hence referred here. 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 The patient was started on hydroxyurea. On 05/16/2014, hydroxyurea is increased to 2 tablets a day On 06/09/2014, hydroxyurea dose is reduced back to one tablet per day.  on 09/09/2014, hydroxyurea is reduced to every other day. On 10/09/2014, hydroxyurea was placed on hold due to progressive anemia In February 2016, hydroxyurea is reduced to 500 mg on Mondays and Fridays only and hold the weekends In May 2015, he recurrent admission to the hospital with aspiration pneumonia  INTERVAL HISTORY: He is here today accompanied by his son Please see below for problem oriented charting. He is weak. He still had mild nonproductive cough. Denies recent choking sensation. He has reduced energy with poor mobility. He continues to have bilateral leg edema. He complained of occasional dizziness. He has not been eating well.  REVIEW OF SYSTEMS:   Constitutional: Denies fevers, chills or abnormal weight loss Eyes:  Denies blurriness of vision Ears, nose, mouth, throat, and face: Denies mucositis or sore throat Cardiovascular: Denies palpitation, chest discomfort  Gastrointestinal:  Denies nausea, heartburn or change in bowel habits Skin: Denies abnormal skin rashes Lymphatics: Denies new lymphadenopathy or easy bruising Neurological:Denies numbness, tingling or new weaknesses Behavioral/Psych: Mood is stable, no new changes  All other systems were reviewed with the patient and are negative.  I have reviewed the past medical history, past surgical history, social history and family history with the patient and they are unchanged from previous note.  ALLERGIES:  is allergic to cefprozil and simvastatin.  MEDICATIONS:  Current Outpatient Prescriptions  Medication Sig Dispense Refill  . acetaminophen (TYLENOL) 325 MG tablet Take 2 tablets (650 mg total) by mouth every 6 (six) hours as needed for mild pain (or Fever >/= 101).    Marland Kitchen acetaminophen-codeine (TYLENOL #3) 300-30 MG per tablet Take 1 tablet by mouth every 6 (six) hours as needed (for pain). 30 tablet 0  . albuterol (PROVENTIL HFA;VENTOLIN HFA) 108 (90 BASE) MCG/ACT inhaler Inhale 1 puff into the lungs every 4 (four) hours as needed for wheezing (and chest tightness).     Marland Kitchen aspirin 81 MG chewable tablet Chew 81 mg by mouth daily after breakfast.    . budesonide-formoterol (SYMBICORT) 160-4.5 MCG/ACT inhaler Inhale 2 puffs into the lungs 2 (two) times daily.    . chlorhexidine (PERIDEX) 0.12 % solution Use as directed 15 mLs in the mouth or throat 2 (two) times daily.    . Cholecalciferol (VITAMIN D) 2000 UNITS CAPS Take 1 capsule by mouth daily with breakfast.    . divalproex (  DEPAKOTE ER) 250 MG 24 hr tablet Take 250-500 mg by mouth 2 (two) times daily. Takes 250mg  in the morning at 0800 and 500mg  every evening at 2000    . flintstones complete (FLINTSTONES) 60 MG chewable tablet Chew 1 tablet by mouth daily after breakfast.    . furosemide  (LASIX) 40 MG tablet Take 40 mg by mouth daily.    Marland Kitchen GLUCERNA (GLUCERNA) LIQD Take 237 mLs by mouth 2 (two) times daily between meals.    Marland Kitchen guaiFENesin (MUCINEX) 600 MG 12 hr tablet Take 1 tablet (600 mg total) by mouth 2 (two) times daily.    . hydroxyurea (HYDREA) 500 MG capsule Take 1 capsule (500 mg total) by mouth daily. May take with food to minimize GI side effects. Take 1 pill daily except Saturdays and Sundays. (Patient taking differently: Take 500 mg by mouth See admin instructions. Take 1 pill daily except Saturdays and Sundays. May take with food to minimize GI side effects.) 20 capsule 0  . isosorbide dinitrate (ISORDIL) 30 MG tablet Take 30 mg by mouth daily after breakfast.     . LORazepam (ATIVAN) 0.5 MG tablet Take 0.5 tablets (0.25 mg total) by mouth every 12 (twelve) hours as needed for anxiety. 30 tablet 0  . metoprolol succinate (TOPROL-XL) 25 MG 24 hr tablet Take 1 tablet (25 mg total) by mouth daily. (Patient taking differently: Take 25 mg by mouth daily after breakfast. )    . nitroGLYCERIN (NITROSTAT) 0.4 MG SL tablet Place 0.4 mg under the tongue every 5 (five) minutes as needed for chest pain.    . OXYGEN Inhale 2 L into the lungs at bedtime. Uses from 2000 to 0600    . pantoprazole (PROTONIX) 40 MG tablet Take 1 tablet (40 mg total) by mouth daily. 30 tablet 2  . polyethylene glycol powder (GLYCOLAX/MIRALAX) powder Take 17 g by mouth daily.     . QUEtiapine (SEROQUEL) 25 MG tablet Take 25 mg by mouth every evening.    . sertraline (ZOLOFT) 50 MG tablet Take 75 mg by mouth daily with breakfast.     . tamsulosin (FLOMAX) 0.4 MG CAPS capsule Take 0.4 mg by mouth every evening.    . tiotropium (SPIRIVA) 18 MCG inhalation capsule Place 18 mcg into inhaler and inhale daily.    Marland Kitchen tolterodine (DETROL LA) 2 MG 24 hr capsule Take 2 mg by mouth daily with breakfast.     No current facility-administered medications for this visit.    PHYSICAL EXAMINATION: ECOG PERFORMANCE  STATUS: 2 - Symptomatic, <50% confined to bed  Filed Vitals:   03/12/15 1104  BP: 98/38  Pulse: 63  Temp: 97.9 F (36.6 C)  Resp: 18   Filed Weights   03/12/15 1104  Weight: 194 lb 12.8 oz (88.361 kg)    GENERAL:alert, no distress and comfortable. He appears ill SKIN: skin color, texture, turgor are normal, no rashes or significant lesions EYES: normal, Conjunctiva are pink and non-injected, sclera clear OROPHARYNX:no exudate, no erythema and lips, buccal mucosa, and tongue normal  NECK: supple, thyroid normal size, non-tender, without nodularity LYMPH:  no palpable lymphadenopathy in the cervical, axillary or inguinal LUNGS: clear to auscultation and percussion with normal breathing effort HEART: regular rate & rhythm and no murmurs with moderate bilateral lower extremity edema ABDOMEN:abdomen soft, non-tender and normal bowel sounds Musculoskeletal:no cyanosis of digits and no clubbing  NEURO: alert & oriented x 3 with fluent speech, no focal motor/sensory deficits  LABORATORY DATA:  I have  reviewed the data as listed    Component Value Date/Time   NA 138 02/28/2015 0518   NA 143 05/07/2014 1445   K 4.2 02/28/2015 0518   K 4.4 05/07/2014 1445   CL 101 02/28/2015 0518   CO2 28 02/28/2015 0518   CO2 28 05/07/2014 1445   GLUCOSE 166* 02/28/2015 0518   GLUCOSE 102 05/07/2014 1445   BUN 52* 02/28/2015 0518   BUN 21.6 05/07/2014 1445   CREATININE 1.72* 02/28/2015 0518   CREATININE 1.2 05/07/2014 1445   CALCIUM 8.3* 02/28/2015 0518   CALCIUM 8.6 05/07/2014 1445   PROT 5.5* 02/26/2015 0524   PROT 5.7* 05/07/2014 1445   ALBUMIN 3.3* 02/26/2015 0524   ALBUMIN 3.3* 05/07/2014 1445   AST 32 02/26/2015 0524   AST 25 05/07/2014 1445   ALT 22 02/26/2015 0524   ALT 18 05/07/2014 1445   ALKPHOS 44 02/26/2015 0524   ALKPHOS 103 05/07/2014 1445   BILITOT 0.9 02/26/2015 0524   BILITOT 0.43 05/07/2014 1445   GFRNONAA 34* 02/28/2015 0518   GFRAA 39* 02/28/2015 0518    No  results found for: SPEP, UPEP  Lab Results  Component Value Date   WBC 2.7* 03/12/2015   NEUTROABS 1.7 03/12/2015   HGB 9.7* 03/12/2015   HCT 29.0* 03/12/2015   MCV 109.3* 03/12/2015   PLT 163 03/12/2015      Chemistry      Component Value Date/Time   NA 138 02/28/2015 0518   NA 143 05/07/2014 1445   K 4.2 02/28/2015 0518   K 4.4 05/07/2014 1445   CL 101 02/28/2015 0518   CO2 28 02/28/2015 0518   CO2 28 05/07/2014 1445   BUN 52* 02/28/2015 0518   BUN 21.6 05/07/2014 1445   CREATININE 1.72* 02/28/2015 0518   CREATININE 1.2 05/07/2014 1445      Component Value Date/Time   CALCIUM 8.3* 02/28/2015 0518   CALCIUM 8.6 05/07/2014 1445   ALKPHOS 44 02/26/2015 0524   ALKPHOS 103 05/07/2014 1445   AST 32 02/26/2015 0524   AST 25 05/07/2014 1445   ALT 22 02/26/2015 0524   ALT 18 05/07/2014 1445   BILITOT 0.9 02/26/2015 0524   BILITOT 0.43 05/07/2014 1445     I reviewed his recent hospitalization and multiple consult notes ASSESSMENT & PLAN: We have extensive discussion today   Myeloproliferative disease He is not tolerating treatment well, especially with recent illness and hospitalization. I recommend holding off further hydroxyurea and continue on supportive care only. I plan to see him next month for further repeat blood work.     Anemia in neoplastic disease This is due to recent illness and hydroxyurea. I recommend stopping hydroxyurea for now. He does not require blood transfusion.    Hypotension He has significant hypotension. I'm very concerned about his risk of fall. The patient is symptomatic with profound fatigue I recommend close follow-up with primary care doctor for blood pressure monitoring and medication adjustment. I recommend he stop Isordil for now.   Dysphagia He has recent significant dysphagia and aspiration pneumonia. I reviewed his recent hospitalization progress note by speech and language therapists who recommend chin tuck  maneuver. He does not appear that the patient understood and have been practicing chin tuck maneuver. I recommend repeat swallow assessment and further management by speech and language therapist as an outpatient I recommend the patient to pay attention when he eats and not to be distracted. Also recommend that he sit up straight when he drinks.  EDEMA- LOCALIZED He has significant lower extremity edema likely due to reduced mobility. His prior heart echocardiogram did not show any evidence of congestive heart failure. I recommend leg elevation and reduced intake.    Orders Placed This Encounter  Procedures  . Ambulatory referral to Speech Therapy    Referral Priority:  Urgent    Referral Type:  Speech Therapy    Referral Reason:  Specialty Services Required    Requested Specialty:  Speech Pathology    Number of Visits Requested:  1   All questions were answered. The patient knows to call the clinic with any problems, questions or concerns. No barriers to learning was detected. I spent 30 minutes counseling the patient face to face. The total time spent in the appointment was 40 minutes and more than 50% was on counseling and review of test results     [facility], [place], MD 03/13/2015 2:23 PM

## 2015-03-13 NOTE — Assessment & Plan Note (Signed)
He has significant lower extremity edema likely due to reduced mobility. His prior heart echocardiogram did not show any evidence of congestive heart failure. I recommend leg elevation and reduced intake.

## 2015-03-13 NOTE — Assessment & Plan Note (Signed)
He has significant hypotension. I'm very concerned about his risk of fall. The patient is symptomatic with profound fatigue I recommend close follow-up with primary care doctor for blood pressure monitoring and medication adjustment. I recommend he stop Isordil for now.

## 2015-03-13 NOTE — Assessment & Plan Note (Signed)
He is not tolerating treatment well, especially with recent illness and hospitalization. I recommend holding off further hydroxyurea and continue on supportive care only. I plan to see him next month for further repeat blood work.

## 2015-03-13 NOTE — Assessment & Plan Note (Signed)
This is due to recent illness and hydroxyurea. I recommend stopping hydroxyurea for now. He does not require blood transfusion.

## 2015-03-13 NOTE — Assessment & Plan Note (Signed)
He has recent significant dysphagia and aspiration pneumonia. I reviewed his recent hospitalization progress note by speech and language therapists who recommend chin tuck maneuver. He does not appear that the patient understood and have been practicing chin tuck maneuver. I recommend repeat swallow assessment and further management by speech and language therapist as an outpatient I recommend the patient to pay attention when he eats and not to be distracted. Also recommend that he sit up straight when he drinks.

## 2015-03-20 ENCOUNTER — Encounter: Payer: Self-pay | Admitting: Hematology and Oncology

## 2015-04-08 ENCOUNTER — Other Ambulatory Visit: Payer: Self-pay | Admitting: Hematology and Oncology

## 2015-04-08 DIAGNOSIS — R1310 Dysphagia, unspecified: Secondary | ICD-10-CM

## 2015-04-09 ENCOUNTER — Ambulatory Visit (INDEPENDENT_AMBULATORY_CARE_PROVIDER_SITE_OTHER): Payer: 59 | Admitting: Psychology

## 2015-04-09 DIAGNOSIS — F339 Major depressive disorder, recurrent, unspecified: Secondary | ICD-10-CM | POA: Diagnosis not present

## 2015-04-16 ENCOUNTER — Ambulatory Visit (HOSPITAL_BASED_OUTPATIENT_CLINIC_OR_DEPARTMENT_OTHER): Payer: Medicare Other | Admitting: Hematology and Oncology

## 2015-04-16 ENCOUNTER — Telehealth: Payer: Self-pay | Admitting: Hematology and Oncology

## 2015-04-16 ENCOUNTER — Encounter: Payer: Self-pay | Admitting: Hematology and Oncology

## 2015-04-16 ENCOUNTER — Other Ambulatory Visit (HOSPITAL_BASED_OUTPATIENT_CLINIC_OR_DEPARTMENT_OTHER): Payer: Medicare Other

## 2015-04-16 VITALS — BP 101/41 | HR 73 | Temp 97.9°F | Resp 18 | Ht 68.0 in | Wt 194.7 lb

## 2015-04-16 DIAGNOSIS — I959 Hypotension, unspecified: Secondary | ICD-10-CM

## 2015-04-16 DIAGNOSIS — D630 Anemia in neoplastic disease: Secondary | ICD-10-CM | POA: Diagnosis not present

## 2015-04-16 DIAGNOSIS — D471 Chronic myeloproliferative disease: Secondary | ICD-10-CM

## 2015-04-16 DIAGNOSIS — C946 Myelodysplastic disease, not classified: Secondary | ICD-10-CM | POA: Diagnosis not present

## 2015-04-16 DIAGNOSIS — D649 Anemia, unspecified: Secondary | ICD-10-CM

## 2015-04-16 DIAGNOSIS — R296 Repeated falls: Secondary | ICD-10-CM

## 2015-04-16 DIAGNOSIS — I9589 Other hypotension: Secondary | ICD-10-CM

## 2015-04-16 LAB — CBC WITH DIFFERENTIAL/PLATELET
BASO%: 0.5 % (ref 0.0–2.0)
BASOS ABS: 0 10*3/uL (ref 0.0–0.1)
EOS ABS: 0.1 10*3/uL (ref 0.0–0.5)
EOS%: 1.2 % (ref 0.0–7.0)
HCT: 31.6 % — ABNORMAL LOW (ref 38.4–49.9)
HGB: 10.2 g/dL — ABNORMAL LOW (ref 13.0–17.1)
LYMPH#: 1.3 10*3/uL (ref 0.9–3.3)
LYMPH%: 29.6 % (ref 14.0–49.0)
MCH: 35.2 pg — AB (ref 27.2–33.4)
MCHC: 32.3 g/dL (ref 32.0–36.0)
MCV: 109 fL — ABNORMAL HIGH (ref 79.3–98.0)
MONO#: 0.5 10*3/uL (ref 0.1–0.9)
MONO%: 10.9 % (ref 0.0–14.0)
NEUT%: 57.8 % (ref 39.0–75.0)
NEUTROS ABS: 2.5 10*3/uL (ref 1.5–6.5)
Platelets: 219 10*3/uL (ref 140–400)
RBC: 2.9 10*6/uL — ABNORMAL LOW (ref 4.20–5.82)
RDW: 15.3 % — AB (ref 11.0–14.6)
WBC: 4.2 10*3/uL (ref 4.0–10.3)

## 2015-04-16 NOTE — Addendum Note (Signed)
Addended by: Arty Baumgartner on: 04/16/2015 03:05 PM   Modules accepted: Orders, Medications

## 2015-04-16 NOTE — Assessment & Plan Note (Signed)
He has significant hypotension. I'm very concerned about his risk of fall. The patient is symptomatic with profound fatigue I recommend close follow-up with primary care doctor for blood pressure monitoring and medication adjustment.

## 2015-04-16 NOTE — Assessment & Plan Note (Signed)
He is not tolerating treatment well, especially with recent illness and hospitalization. I recommend holding off further hydroxyurea and continue on supportive care only. I plan to see him in 2 months for further repeat blood work.

## 2015-04-16 NOTE — Assessment & Plan Note (Signed)
He had recurrent falls due to weakness and postural hypotension. He denied having recent significant injuries.

## 2015-04-16 NOTE — Assessment & Plan Note (Signed)
This is due to recent illness and hydroxyurea. I recommend stopping hydroxyurea for now. He does not require blood transfusion.

## 2015-04-16 NOTE — Telephone Encounter (Signed)
per pof to sch pt appt-gave pt copy of avs °

## 2015-04-16 NOTE — Progress Notes (Signed)
[facility] OFFICE PROGRESS NOTE  Patient Care Team: Hayden Rasmussen, MD as PCP - General (Family Medicine)  SUMMARY OF ONCOLOGIC HISTORY:  He was found to have abnormal CBC from recent CBC monitoring when he saw his primary care provider for recurrent falls. This patient had history disease, with angioplasty and stent placement in 2002 and 2005.  In May of this year, he fell at [employer]. After a detailed history, he stated that he had misjudged the distance between his hands and railing and fell. He did not sustain major injury but his CBC showed mild leukocytosis, anemia and very high platelet count, platelet count over 800,000. According to his son, the patient may have transient ischemic attack with mild weakness which subsequently resolved. A week ago, he had accident and had broken ribs. He had CT imaging study which confirmed retractors. Repeat CBC showed platelet count over 1 million. He is hence referred here. 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 The patient was started on hydroxyurea. On 05/16/2014, hydroxyurea is increased to 2 tablets a day On 06/09/2014, hydroxyurea dose is reduced back to one tablet per day.  on 09/09/2014, hydroxyurea is reduced to every other day. On 10/09/2014, hydroxyurea was placed on hold due to progressive anemia In February 2016, hydroxyurea is reduced to 500 mg on Mondays and Fridays only and hold the weekends In May 2016, he recurrent admission to the hospital with aspiration pneumonia   INTERVAL HISTORY: Please see below for problem oriented charting. He returns today for further follow-up, accompanied by his son. His son did not notice any significant choking problems. No recent fevers or chills. He had another episode of fall due to weakness. No major injuries were sustained.  REVIEW OF SYSTEMS:   Constitutional: Denies fevers, chills or abnormal weight loss Eyes: Denies blurriness of vision Ears, nose,  mouth, throat, and face: Denies mucositis or sore throat Respiratory: Denies cough, dyspnea or wheezes Cardiovascular: Denies palpitation, chest discomfort or lower extremity swelling Gastrointestinal:  Denies nausea, heartburn or change in bowel habits Skin: Denies abnormal skin rashes Lymphatics: Denies new lymphadenopathy or easy bruising Neurological:Denies numbness, tingling or new weaknesses Behavioral/Psych: Mood is stable, no new changes  All other systems were reviewed with the patient and are negative.  I have reviewed the past medical history, past surgical history, social history and family history with the patient and they are unchanged from previous note.  ALLERGIES:  is allergic to cefprozil and simvastatin.  MEDICATIONS:  Current Outpatient Prescriptions  Medication Sig Dispense Refill  . acetaminophen (TYLENOL) 325 MG tablet Take 2 tablets (650 mg total) by mouth every 6 (six) hours as needed for mild pain (or Fever >/= 101).    Marland Kitchen acetaminophen-codeine (TYLENOL #3) 300-30 MG per tablet Take 1 tablet by mouth every 6 (six) hours as needed (for pain). 30 tablet 0  . albuterol (PROVENTIL HFA;VENTOLIN HFA) 108 (90 BASE) MCG/ACT inhaler Inhale 1 puff into the lungs every 4 (four) hours as needed for wheezing (and chest tightness).     Marland Kitchen aspirin 81 MG chewable tablet Chew 81 mg by mouth daily after breakfast.    . budesonide-formoterol (SYMBICORT) 160-4.5 MCG/ACT inhaler Inhale 2 puffs into the lungs 2 (two) times daily.    . chlorhexidine (PERIDEX) 0.12 % solution Use as directed 15 mLs in the mouth or throat 2 (two) times daily.    . Cholecalciferol (VITAMIN D) 2000 UNITS CAPS Take 1 capsule by mouth daily with breakfast.    .  divalproex (DEPAKOTE ER) 250 MG 24 hr tablet Take 250-500 mg by mouth 2 (two) times daily. Takes 250mg  in the morning at 0800 and 500mg  every evening at 2000    . flintstones complete (FLINTSTONES) 60 MG chewable tablet Chew 1 tablet by mouth daily after  breakfast.    . furosemide (LASIX) 40 MG tablet Take 40 mg by mouth daily.    Marland Kitchen GLUCERNA (GLUCERNA) LIQD Take 237 mLs by mouth 2 (two) times daily between meals.    Marland Kitchen guaiFENesin (MUCINEX) 600 MG 12 hr tablet Take 1 tablet (600 mg total) by mouth 2 (two) times daily.    . hydroxyurea (HYDREA) 500 MG capsule Take 1 capsule (500 mg total) by mouth daily. May take with food to minimize GI side effects. Take 1 pill daily except Saturdays and Sundays. (Patient taking differently: Take 500 mg by mouth See admin instructions. Take 1 pill daily except Saturdays and Sundays. May take with food to minimize GI side effects.) 20 capsule 0  . isosorbide dinitrate (ISORDIL) 30 MG tablet Take 30 mg by mouth daily after breakfast.     . LORazepam (ATIVAN) 0.5 MG tablet Take 0.5 tablets (0.25 mg total) by mouth every 12 (twelve) hours as needed for anxiety. 30 tablet 0  . metoprolol succinate (TOPROL-XL) 25 MG 24 hr tablet Take 1 tablet (25 mg total) by mouth daily. (Patient taking differently: Take 25 mg by mouth daily after breakfast. )    . nitroGLYCERIN (NITROSTAT) 0.4 MG SL tablet Place 0.4 mg under the tongue every 5 (five) minutes as needed for chest pain.    . OXYGEN Inhale 2 L into the lungs at bedtime. Uses from 2000 to 0600    . pantoprazole (PROTONIX) 40 MG tablet Take 1 tablet (40 mg total) by mouth daily. 30 tablet 2  . polyethylene glycol powder (GLYCOLAX/MIRALAX) powder Take 17 g by mouth daily.     . QUEtiapine (SEROQUEL) 25 MG tablet Take 25 mg by mouth every evening.    . sertraline (ZOLOFT) 50 MG tablet Take 75 mg by mouth daily with breakfast.     . tamsulosin (FLOMAX) 0.4 MG CAPS capsule Take 0.4 mg by mouth every evening.    . tiotropium (SPIRIVA) 18 MCG inhalation capsule Place 18 mcg into inhaler and inhale daily.    Marland Kitchen tolterodine (DETROL LA) 2 MG 24 hr capsule Take 2 mg by mouth daily with breakfast.     No current facility-administered medications for this visit.    PHYSICAL  EXAMINATION: ECOG PERFORMANCE STATUS: 2 - Symptomatic, <50% confined to bed  Filed Vitals:   04/16/15 1141  BP: 101/41  Pulse: 73  Temp: 97.9 F (36.6 C)  Resp: 18   Filed Weights   04/16/15 1141  Weight: 194 lb 11.2 oz (88.315 kg)    GENERAL:alert, no distress and comfortable SKIN: skin color, texture, turgor are normal, no rashes or significant lesions EYES: normal, Conjunctiva are pink and non-injected, sclera clear Musculoskeletal:no cyanosis of digits and no clubbing  NEURO: alert & oriented x 3 with fluent speech, no focal motor/sensory deficits  LABORATORY DATA:  I have reviewed the data as listed    Component Value Date/Time   NA 138 02/28/2015 0518   NA 143 05/07/2014 1445   K 4.2 02/28/2015 0518   K 4.4 05/07/2014 1445   CL 101 02/28/2015 0518   CO2 28 02/28/2015 0518   CO2 28 05/07/2014 1445   GLUCOSE 166* 02/28/2015 0518   GLUCOSE 102 05/07/2014  1445   BUN 52* 02/28/2015 0518   BUN 21.6 05/07/2014 1445   CREATININE 1.72* 02/28/2015 0518   CREATININE 1.2 05/07/2014 1445   CALCIUM 8.3* 02/28/2015 0518   CALCIUM 8.6 05/07/2014 1445   PROT 5.5* 02/26/2015 0524   PROT 5.7* 05/07/2014 1445   ALBUMIN 3.3* 02/26/2015 0524   ALBUMIN 3.3* 05/07/2014 1445   AST 32 02/26/2015 0524   AST 25 05/07/2014 1445   ALT 22 02/26/2015 0524   ALT 18 05/07/2014 1445   ALKPHOS 44 02/26/2015 0524   ALKPHOS 103 05/07/2014 1445   BILITOT 0.9 02/26/2015 0524   BILITOT 0.43 05/07/2014 1445   GFRNONAA 34* 02/28/2015 0518   GFRAA 39* 02/28/2015 0518    No results found for: SPEP, UPEP  Lab Results  Component Value Date   WBC 4.2 04/16/2015   NEUTROABS 2.5 04/16/2015   HGB 10.2* 04/16/2015   HCT 31.6* 04/16/2015   MCV 109.0* 04/16/2015   PLT 219 04/16/2015      Chemistry      Component Value Date/Time   NA 138 02/28/2015 0518   NA 143 05/07/2014 1445   K 4.2 02/28/2015 0518   K 4.4 05/07/2014 1445   CL 101 02/28/2015 0518   CO2 28 02/28/2015 0518   CO2 28  05/07/2014 1445   BUN 52* 02/28/2015 0518   BUN 21.6 05/07/2014 1445   CREATININE 1.72* 02/28/2015 0518   CREATININE 1.2 05/07/2014 1445      Component Value Date/Time   CALCIUM 8.3* 02/28/2015 0518   CALCIUM 8.6 05/07/2014 1445   ALKPHOS 44 02/26/2015 0524   ALKPHOS 103 05/07/2014 1445   AST 32 02/26/2015 0524   AST 25 05/07/2014 1445   ALT 22 02/26/2015 0524   ALT 18 05/07/2014 1445   BILITOT 0.9 02/26/2015 0524   BILITOT 0.43 05/07/2014 1445     ASSESSMENT & PLAN:  Anemia in neoplastic disease This is due to recent illness and hydroxyurea. I recommend stopping hydroxyurea for now. He does not require blood transfusion.   Myeloproliferative disease He is not tolerating treatment well, especially with recent illness and hospitalization. I recommend holding off further hydroxyurea and continue on supportive care only. I plan to see him in 2 months for further repeat blood work.      Hypotension He has significant hypotension. I'm very concerned about his risk of fall. The patient is symptomatic with profound fatigue I recommend close follow-up with primary care doctor for blood pressure monitoring and medication adjustment.    Recurrent falls He had recurrent falls due to weakness and postural hypotension. He denied having recent significant injuries.   No orders of the defined types were placed in this encounter.   All questions were answered. The patient knows to call the clinic with any problems, questions or concerns. No barriers to learning was detected. I spent 15 minutes counseling the patient face to face. The total time spent in the appointment was 20 minutes and more than 50% was on counseling and review of test results     [facility] [address], [place], MD 04/16/2015 12:59 PM

## 2015-04-23 ENCOUNTER — Ambulatory Visit (INDEPENDENT_AMBULATORY_CARE_PROVIDER_SITE_OTHER): Payer: 59 | Admitting: Psychology

## 2015-04-23 DIAGNOSIS — F339 Major depressive disorder, recurrent, unspecified: Secondary | ICD-10-CM

## 2015-04-27 ENCOUNTER — Encounter: Payer: Self-pay | Admitting: Hematology and Oncology

## 2015-04-27 ENCOUNTER — Encounter: Payer: Self-pay | Admitting: Internal Medicine

## 2015-04-27 NOTE — Telephone Encounter (Signed)
I spoke with the patient's son and discuss end-of-life care and the reason to consider moving the patient out of assisted living to a nursing home. I addressed all his questions

## 2015-04-28 ENCOUNTER — Telehealth: Payer: Self-pay | Admitting: Hematology and Oncology

## 2015-04-28 NOTE — Telephone Encounter (Signed)
Faxed Records to Drs [employer]

## 2015-05-07 ENCOUNTER — Ambulatory Visit (INDEPENDENT_AMBULATORY_CARE_PROVIDER_SITE_OTHER): Payer: 59 | Admitting: Psychology

## 2015-05-07 DIAGNOSIS — F339 Major depressive disorder, recurrent, unspecified: Secondary | ICD-10-CM

## 2015-05-19 ENCOUNTER — Ambulatory Visit: Payer: 59 | Admitting: Psychology

## 2015-06-07 ENCOUNTER — Encounter: Payer: Self-pay | Admitting: Neurology

## 2015-06-12 ENCOUNTER — Other Ambulatory Visit: Payer: Self-pay | Admitting: Hematology and Oncology

## 2015-06-12 DIAGNOSIS — D471 Chronic myeloproliferative disease: Secondary | ICD-10-CM

## 2015-06-16 ENCOUNTER — Other Ambulatory Visit (HOSPITAL_BASED_OUTPATIENT_CLINIC_OR_DEPARTMENT_OTHER): Payer: Medicare Other

## 2015-06-16 ENCOUNTER — Telehealth: Payer: Self-pay | Admitting: Hematology and Oncology

## 2015-06-16 ENCOUNTER — Encounter: Payer: Self-pay | Admitting: Hematology and Oncology

## 2015-06-16 ENCOUNTER — Ambulatory Visit (HOSPITAL_BASED_OUTPATIENT_CLINIC_OR_DEPARTMENT_OTHER): Payer: Medicare Other | Admitting: Hematology and Oncology

## 2015-06-16 VITALS — BP 115/53 | HR 70 | Temp 98.2°F | Resp 18 | Ht 68.0 in | Wt 190.9 lb

## 2015-06-16 DIAGNOSIS — D471 Chronic myeloproliferative disease: Secondary | ICD-10-CM | POA: Diagnosis not present

## 2015-06-16 DIAGNOSIS — C946 Myelodysplastic disease, not classified: Secondary | ICD-10-CM | POA: Diagnosis not present

## 2015-06-16 DIAGNOSIS — D630 Anemia in neoplastic disease: Secondary | ICD-10-CM

## 2015-06-16 LAB — CBC & DIFF AND RETIC
BASO%: 0.2 % (ref 0.0–2.0)
Basophils Absolute: 0 10*3/uL (ref 0.0–0.1)
EOS%: 1.2 % (ref 0.0–7.0)
Eosinophils Absolute: 0.1 10*3/uL (ref 0.0–0.5)
HEMATOCRIT: 35.6 % — AB (ref 38.4–49.9)
HGB: 11.4 g/dL — ABNORMAL LOW (ref 13.0–17.1)
IMMATURE RETIC FRACT: 15.5 % — AB (ref 3.00–10.60)
LYMPH%: 14.3 % (ref 14.0–49.0)
MCH: 31.1 pg (ref 27.2–33.4)
MCHC: 32 g/dL (ref 32.0–36.0)
MCV: 97 fL (ref 79.3–98.0)
MONO#: 1 10*3/uL — ABNORMAL HIGH (ref 0.1–0.9)
MONO%: 10.6 % (ref 0.0–14.0)
NEUT#: 6.9 10*3/uL — ABNORMAL HIGH (ref 1.5–6.5)
NEUT%: 73.7 % (ref 39.0–75.0)
PLATELETS: 568 10*3/uL — AB (ref 140–400)
RBC: 3.67 10*6/uL — ABNORMAL LOW (ref 4.20–5.82)
RDW: 18.9 % — ABNORMAL HIGH (ref 11.0–14.6)
RETIC %: 1.37 % (ref 0.80–1.80)
Retic Ct Abs: 50.28 10*3/uL (ref 34.80–93.90)
WBC: 9.4 10*3/uL (ref 4.0–10.3)
lymph#: 1.3 10*3/uL (ref 0.9–3.3)
nRBC: 0 % (ref 0–0)

## 2015-06-16 MED ORDER — HYDROXYUREA 200 MG PO CAPS: 200.0000 mg | ORAL_CAPSULE | Freq: Every day | ORAL | Status: DC

## 2015-06-16 NOTE — Telephone Encounter (Signed)
Gave adn printed appt sched and avs for pt for DEc °

## 2015-06-16 NOTE — Progress Notes (Signed)
[place] OFFICE PROGRESS NOTE  Patient Care Team: Lajean Manes, MD as Consulting Physician (Internal Medicine)  SUMMARY OF ONCOLOGIC HISTORY:  He was found to have abnormal CBC from recent CBC monitoring when he saw his primary care provider for recurrent falls. This patient had history disease, with angioplasty and stent placement in 2002 and 2005.  In May of this year, he fell at [employer]. After a detailed history, he stated that he had misjudged the distance between his hands and railing and fell. He did not sustain major injury but his CBC showed mild leukocytosis, anemia and very high platelet count, platelet count over 800,000. According to his son, the patient may have transient ischemic attack with mild weakness which subsequently resolved. A week ago, he had accident and had broken ribs. He had CT imaging study which confirmed retractors. Repeat CBC showed platelet count over 1 million. He is hence referred here. 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 The patient was started on hydroxyurea. On 05/16/2014, hydroxyurea is increased to 2 tablets a day On 06/09/2014, hydroxyurea dose is reduced back to one tablet per day.  on 09/09/2014, hydroxyurea is reduced to every other day. On 10/09/2014, hydroxyurea was placed on hold due to progressive anemia In February 2016, hydroxyurea is reduced to 500 mg on Mondays and Fridays only and hold the weekends In May 2016, he recurrent admission to the hospital with aspiration pneumonia In June 2016, hydroxyurea was placed on hold On 06/16/2015, hydroxyurea is started at 200 mg daily  INTERVAL HISTORY: Please see below for problem oriented charting. He returns for further follow-up with his son. He took more than one hour just to check in and be placed in the room due to his weakness. He complained of fatigue. His son did not report any recent infection  REVIEW OF SYSTEMS:   Constitutional: Denies fevers,  chills or abnormal weight loss Eyes: Denies blurriness of vision Ears, nose, mouth, throat, and face: Denies mucositis or sore throat Respiratory: Denies cough, dyspnea or wheezes Cardiovascular: Denies palpitation, chest discomfort or lower extremity swelling Gastrointestinal:  Denies nausea, heartburn or change in bowel habits Skin: Denies abnormal skin rashes Lymphatics: Denies new lymphadenopathy or easy bruising Neurological:Denies numbness, tingling or new weaknesses Behavioral/Psych: Mood is stable, no new changes  All other systems were reviewed with the patient and are negative.  I have reviewed the past medical history, past surgical history, social history and family history with the patient and they are unchanged from previous note.  ALLERGIES:  is allergic to cefprozil and simvastatin.  MEDICATIONS:  Current Outpatient Prescriptions  Medication Sig Dispense Refill  . acetaminophen (TYLENOL) 325 MG tablet Take 2 tablets (650 mg total) by mouth every 6 (six) hours as needed for mild pain (or Fever >/= 101).    Marland Kitchen acetaminophen-codeine (TYLENOL #3) 300-30 MG per tablet Take 1 tablet by mouth every 6 (six) hours as needed (for pain). 30 tablet 0  . aspirin 81 MG chewable tablet Chew 81 mg by mouth daily after breakfast.    . budesonide-formoterol (SYMBICORT) 160-4.5 MCG/ACT inhaler Inhale 2 puffs into the lungs 2 (two) times daily.    . chlorhexidine (PERIDEX) 0.12 % solution Use as directed 15 mLs in the mouth or throat 2 (two) times daily.    . chlorthalidone (HYGROTON) 25 MG tablet     . Cholecalciferol (VITAMIN D) 2000 UNITS CAPS Take 1 capsule by mouth daily with breakfast.    . divalproex (DEPAKOTE ER) 250  MG 24 hr tablet Take 250-500 mg by mouth 2 (two) times daily. Takes 250mg  in the morning at 0800 and 500mg  every evening at 2000    . donepezil (ARICEPT) 5 MG tablet     . furosemide (LASIX) 20 MG tablet     . GLUCERNA (GLUCERNA) LIQD Take 237 mLs by mouth 2 (two) times  daily between meals.    Marland Kitchen guaiFENesin (MUCINEX) 600 MG 12 hr tablet Take 1 tablet (600 mg total) by mouth 2 (two) times daily.    . hydroxyurea (DROXIA) 200 MG capsule Take 1 capsule (200 mg total) by mouth daily. 30 capsule 6  . hydroxyurea (HYDREA) 500 MG capsule     . LORazepam (ATIVAN) 0.5 MG tablet Take 0.5 tablets (0.25 mg total) by mouth every 12 (twelve) hours as needed for anxiety. 30 tablet 0  . metoprolol succinate (TOPROL-XL) 25 MG 24 hr tablet Take 1 tablet (25 mg total) by mouth daily. (Patient taking differently: Take 25 mg by mouth daily after breakfast. )    . nitroGLYCERIN (NITROSTAT) 0.4 MG SL tablet Place 0.4 mg under the tongue every 5 (five) minutes as needed for chest pain.    . OXYGEN Inhale 2 L into the lungs at bedtime. Uses from 2000 to 0600    . pantoprazole (PROTONIX) 40 MG tablet Take 1 tablet (40 mg total) by mouth daily. 30 tablet 2  . polyethylene glycol powder (GLYCOLAX/MIRALAX) powder Take 17 g by mouth daily.     . QUEtiapine (SEROQUEL) 25 MG tablet Take 25 mg by mouth every evening.    . sertraline (ZOLOFT) 50 MG tablet Take 75 mg by mouth daily with breakfast.     . sucralfate (CARAFATE) 1 G tablet     . tamsulosin (FLOMAX) 0.4 MG CAPS capsule Take 0.4 mg by mouth every evening.    . tiotropium (SPIRIVA) 18 MCG inhalation capsule Place 18 mcg into inhaler and inhale daily.    Marland Kitchen tolterodine (DETROL LA) 2 MG 24 hr capsule Take 2 mg by mouth daily with breakfast.    . VENTOLIN HFA 108 (90 BASE) MCG/ACT inhaler USE 1 PUFFS BY MOUTH EVERY 4 HOURS AS NEEDED FOR WHEEZING  0   No current facility-administered medications for this visit.    PHYSICAL EXAMINATION: ECOG PERFORMANCE STATUS: 3 - Symptomatic, >50% confined to bed  Filed Vitals:   06/16/15 1200  BP: 115/53  Pulse: 70  Temp: 98.2 F (36.8 C)  Resp: 18   Filed Weights   06/16/15 1200  Weight: 190 lb 14.4 oz (86.592 kg)    GENERAL:alert, no distress and comfortable. He looks elderly,  debilitated, sitting on a wheelchair with oxygen in place SKIN: skin color, texture, turgor are normal, no rashes or significant lesions EYES: normal, Conjunctiva are pink and non-injected, sclera clear Musculoskeletal:no cyanosis of digits and no clubbing  NEURO: alert & oriented x 3 with fluent speech, no focal motor/sensory deficits  LABORATORY DATA:  I have reviewed the data as listed    Component Value Date/Time   NA 138 02/28/2015 0518   NA 143 05/07/2014 1445   K 4.2 02/28/2015 0518   K 4.4 05/07/2014 1445   CL 101 02/28/2015 0518   CO2 28 02/28/2015 0518   CO2 28 05/07/2014 1445   GLUCOSE 166* 02/28/2015 0518   GLUCOSE 102 05/07/2014 1445   BUN 52* 02/28/2015 0518   BUN 21.6 05/07/2014 1445   CREATININE 1.72* 02/28/2015 0518   CREATININE 1.2 05/07/2014 1445  CALCIUM 8.3* 02/28/2015 0518   CALCIUM 8.6 05/07/2014 1445   PROT 5.5* 02/26/2015 0524   PROT 5.7* 05/07/2014 1445   ALBUMIN 3.3* 02/26/2015 0524   ALBUMIN 3.3* 05/07/2014 1445   AST 32 02/26/2015 0524   AST 25 05/07/2014 1445   ALT 22 02/26/2015 0524   ALT 18 05/07/2014 1445   ALKPHOS 44 02/26/2015 0524   ALKPHOS 103 05/07/2014 1445   BILITOT 0.9 02/26/2015 0524   BILITOT 0.43 05/07/2014 1445   GFRNONAA 34* 02/28/2015 0518   GFRAA 39* 02/28/2015 0518    No results found for: SPEP, UPEP  Lab Results  Component Value Date   WBC 9.4 06/16/2015   NEUTROABS 6.9* 06/16/2015   HGB 11.4* 06/16/2015   HCT 35.6* 06/16/2015   MCV 97.0 06/16/2015   PLT 568* 06/16/2015      Chemistry      Component Value Date/Time   NA 138 02/28/2015 0518   NA 143 05/07/2014 1445   K 4.2 02/28/2015 0518   K 4.4 05/07/2014 1445   CL 101 02/28/2015 0518   CO2 28 02/28/2015 0518   CO2 28 05/07/2014 1445   BUN 52* 02/28/2015 0518   BUN 21.6 05/07/2014 1445   CREATININE 1.72* 02/28/2015 0518   CREATININE 1.2 05/07/2014 1445      Component Value Date/Time   CALCIUM 8.3* 02/28/2015 0518   CALCIUM 8.6 05/07/2014 1445    ALKPHOS 44 02/26/2015 0524   ALKPHOS 103 05/07/2014 1445   AST 32 02/26/2015 0524   AST 25 05/07/2014 1445   ALT 22 02/26/2015 0524   ALT 18 05/07/2014 1445   BILITOT 0.9 02/26/2015 0524   BILITOT 0.43 05/07/2014 1445     ASSESSMENT & PLAN:  Myeloproliferative disease Hydroxyurea was put on hold recently due to significant other medical issues. His anemia is improving. His platelet count is now high. I recommend restarting him back on hydroxyurea but at a smaller dose. I gave prescription hydroxyurea 200 mg daily to be started as soon as possible. I also gave him a letter with instruction to the nurses at the nursing home to draw his blood every week and have the results faxed to me so that I can monitor his blood counts carefully. I will see him back in 64months. I will adjust his medication dose based on his blood work over the next few weeks.  Anemia in neoplastic disease This is due to recent illness and hydroxyurea.  He does not require blood transfusion.      No orders of the defined types were placed in this encounter.   All questions were answered. The patient knows to call the clinic with any problems, questions or concerns. No barriers to learning was detected. I spent 15 minutes counseling the patient face to face. The total time spent in the appointment was 20 minutes and more than 50% was on counseling and review of test results     [facility], [place], MD 06/16/2015 3:29 PM

## 2015-06-16 NOTE — Assessment & Plan Note (Signed)
This is due to recent illness and hydroxyurea.  He does not require blood transfusion.

## 2015-06-16 NOTE — Assessment & Plan Note (Signed)
Hydroxyurea was put on hold recently due to significant other medical issues. His anemia is improving. His platelet count is now high. I recommend restarting him back on hydroxyurea but at a smaller dose. I gave prescription hydroxyurea 200 mg daily to be started as soon as possible. I also gave him a letter with instruction to the nurses at the nursing home to draw his blood every week and have the results faxed to me so that I can monitor his blood counts carefully. I will see him back in 22months. I will adjust his medication dose based on his blood work over the next few weeks.

## 2015-06-18 ENCOUNTER — Ambulatory Visit: Payer: Self-pay | Admitting: Neurology

## 2015-06-18 ENCOUNTER — Ambulatory Visit: Payer: 59 | Admitting: Psychology

## 2015-06-25 ENCOUNTER — Ambulatory Visit (INDEPENDENT_AMBULATORY_CARE_PROVIDER_SITE_OTHER): Payer: 59 | Admitting: Psychology

## 2015-06-25 DIAGNOSIS — F339 Major depressive disorder, recurrent, unspecified: Secondary | ICD-10-CM | POA: Diagnosis not present

## 2015-06-26 ENCOUNTER — Telehealth: Payer: Self-pay

## 2015-06-26 NOTE — Telephone Encounter (Signed)
Called and spoke with pt son to see if pt was taking his Hydrea. Was informed pt is residing at [facility] who gives him his medications. Called [facility] with no answer. Pt son states he was unable to get ahold of them as well and if he finds out about the hydrea he will call back.

## 2015-06-29 ENCOUNTER — Other Ambulatory Visit: Payer: Self-pay | Admitting: *Deleted

## 2015-06-29 MED ORDER — HYDROXYUREA 200 MG PO CAPS: ORAL_CAPSULE | ORAL | Status: DC

## 2015-06-29 NOTE — Telephone Encounter (Signed)
Faxed prescription for Hydrea 200 mg to [facility]

## 2015-07-01 ENCOUNTER — Telehealth: Payer: Self-pay | Admitting: *Deleted

## 2015-07-01 ENCOUNTER — Encounter: Payer: Self-pay | Admitting: *Deleted

## 2015-07-01 NOTE — Telephone Encounter (Signed)
LVM at [facility] at [facility] requesting call back to confirm if pt is taking Hydrea and what dose?

## 2015-07-01 NOTE — Telephone Encounter (Signed)
CBC results and med list received from [facility].  Placed on Dr. Calton Dach desk for review.  Med list shows pt had been on Hydrea 200 mg daily thru 9/19 and starting 9/21 he was increased to taking 1 tab daily Mon, Wed, Friday and 2 tablets daily on Tues, Thurs, Sat and Sunday.

## 2015-07-02 NOTE — Telephone Encounter (Signed)
Blood count is much improved Continue the same dose and blood work monitoring again next week

## 2015-07-07 ENCOUNTER — Ambulatory Visit (INDEPENDENT_AMBULATORY_CARE_PROVIDER_SITE_OTHER): Payer: 59 | Admitting: Psychology

## 2015-07-07 DIAGNOSIS — F339 Major depressive disorder, recurrent, unspecified: Secondary | ICD-10-CM

## 2015-07-09 ENCOUNTER — Telehealth: Payer: Self-pay | Admitting: *Deleted

## 2015-07-09 NOTE — Telephone Encounter (Signed)
LVM at [facility] for nurse to call back so I can give Dr. Calton Dach message below.

## 2015-07-09 NOTE — Telephone Encounter (Signed)
-----   Message from Heath Lark, MD sent at 07/09/2015  1:40 PM EDT ----- Regarding: Instructions Test results dated 07/07/15 WBC 7.3 Hg 10.5 Platelets 336  Current treatment dose: Hydrea 200 mg daily Mon, Wed, Friday and 2 tablets daily on Tues, Thurs, Sat and Sunday.   Instructions: Blood counts are satisfactory Continue same dose Recheck in 2 weeks (not 1 week)

## 2015-07-18 IMAGING — CR DG CHEST 2V
2 series · 2 of 2 positions shown · non-contrast
Comparison: 04/30/2014 chest x-ray.  04/24/2014 chest seed.

CLINICAL DATA: 89-year-old male with syncopal episode. High blood
pressure. Bilateral leg edema. Past smoker. Initial encounter.

EXAM:
CHEST  2 VIEW

[w chest lat]
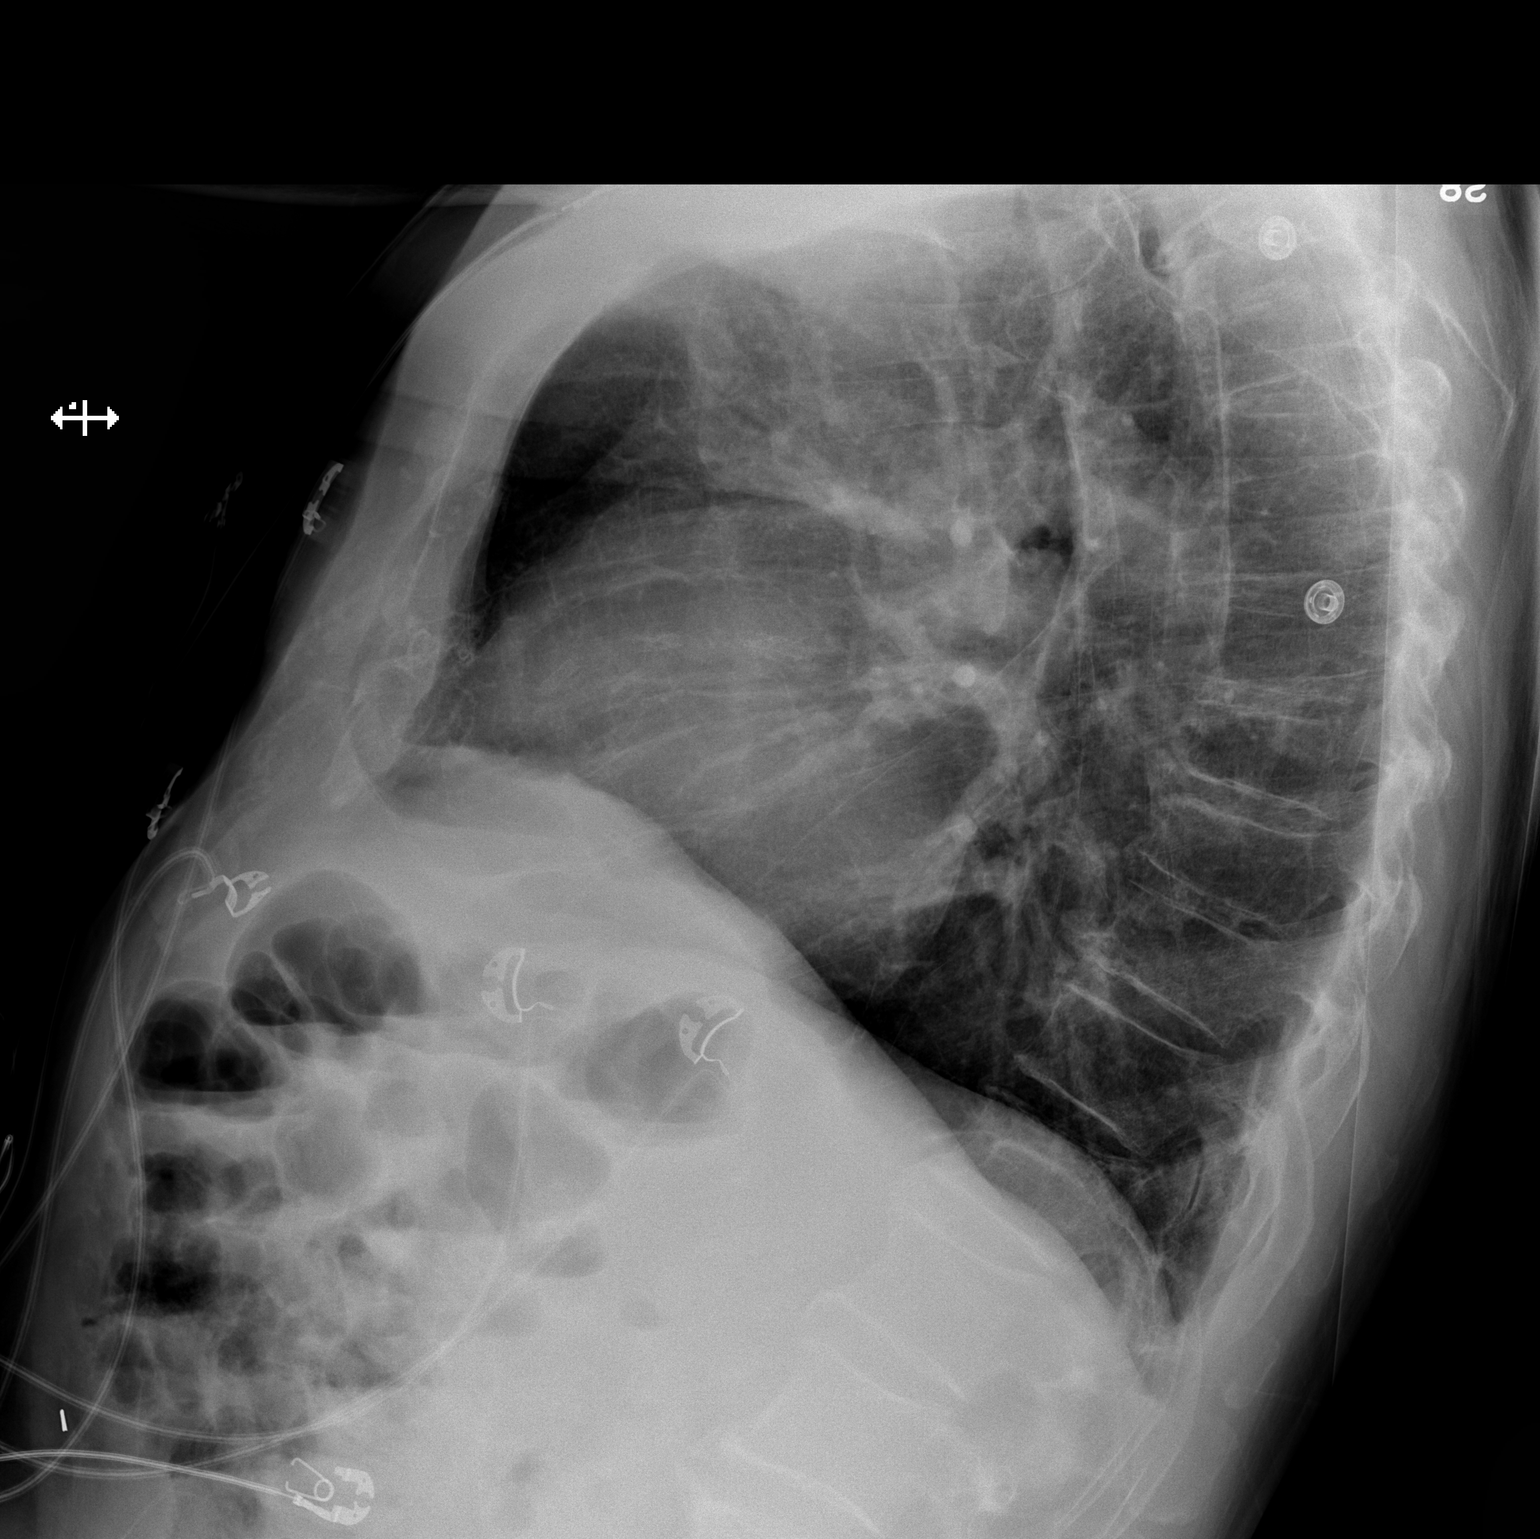

[x chest ap]
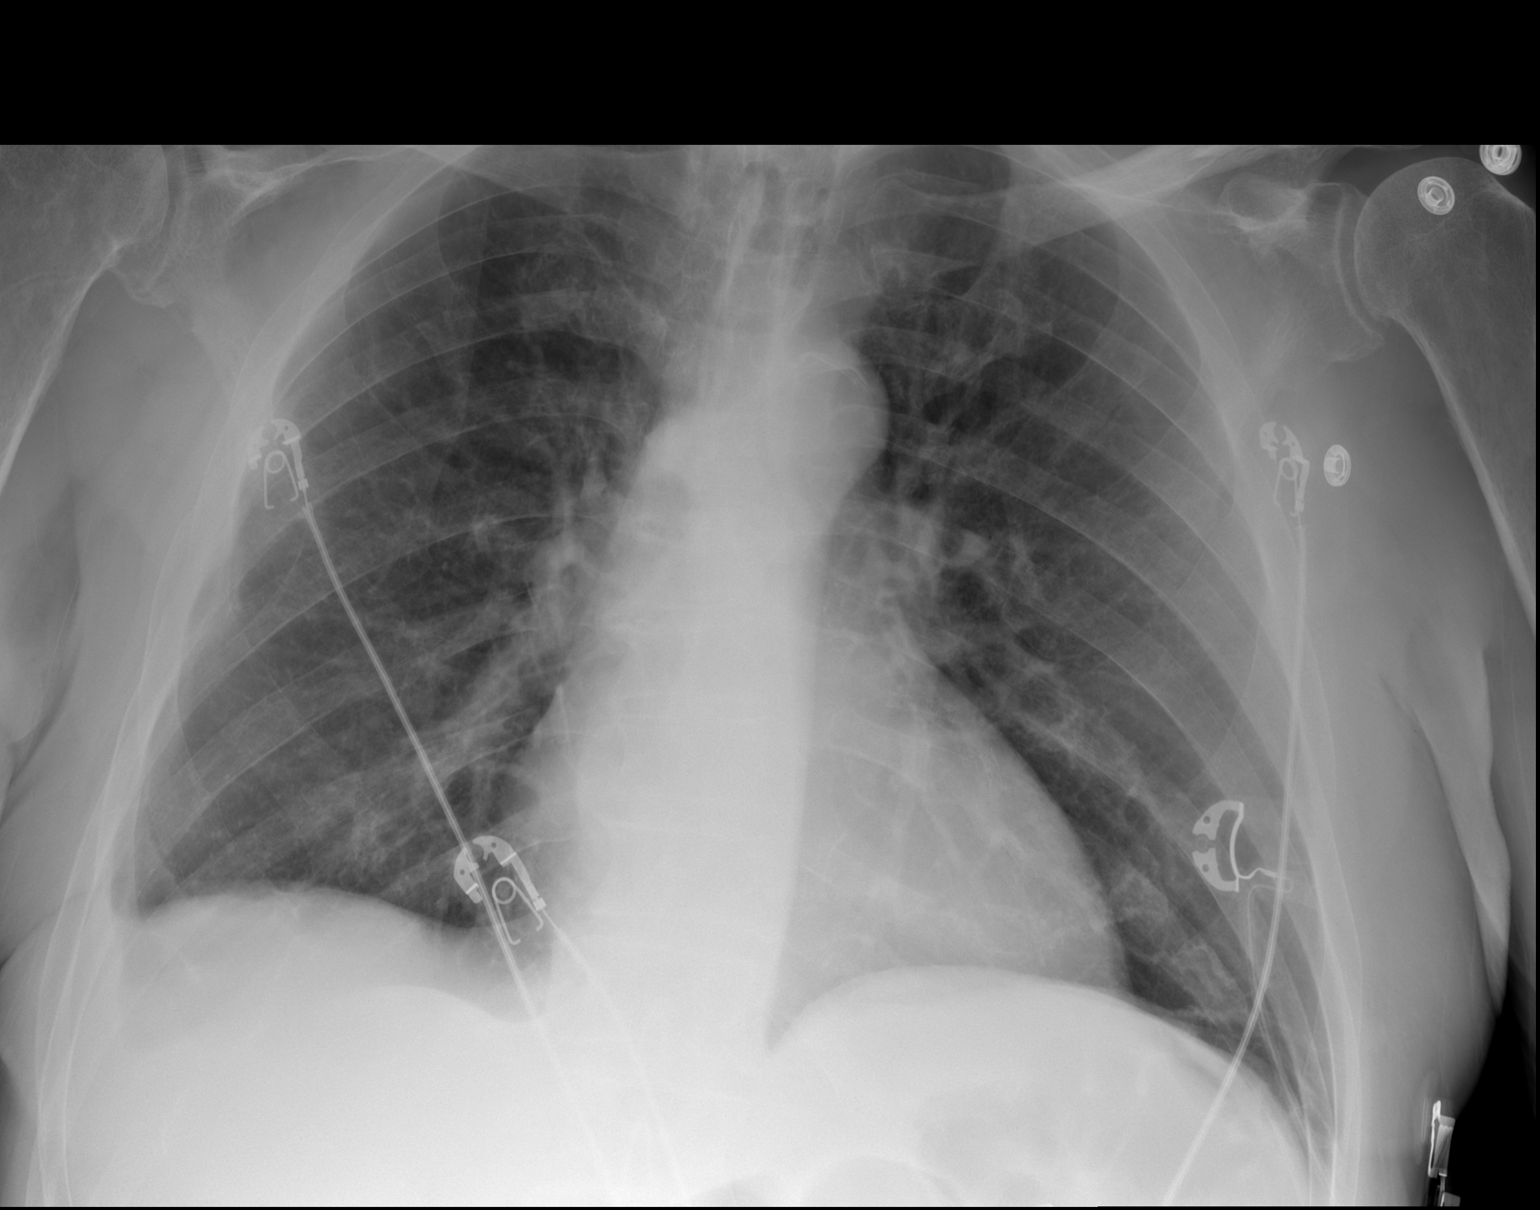

[2 of 2 positions shown; findings below may reference images not displayed]

FINDINGS: Pleural thickening and remote rib fractures unchanged. Blunting
right costophrenic angle suggestive of scarring minimally changed
from prior exams.

No infiltrate, congestive heart failure or pneumothorax.

Calcified mildly tortuous aorta.

No focal thoracic compression deformity.

Heart size within normal limits.
IMPRESSION: No acute abnormality.  Please see above.

## 2015-07-18 IMAGING — CT CT HEAD W/O CM
3 of 6 series · 16 of 47 positions shown, 19 images · non-contrast
Comparison: 05/05/2010

CLINICAL DATA: Syncope.  Fall.  Initial evaluation.

EXAM:
CT HEAD WITHOUT CONTRAST
CT CERVICAL SPINE WITHOUT CONTRAST
TECHNIQUE: Multidetector CT imaging of the head and cervical spine was
performed following the standard protocol without intravenous
contrast. Multiplanar CT image reconstructions of the cervical spine
were also generated.

[Series 602: <mpr thick range> · sagittal · 0.41mm/px · 3 of 55 slices shown]
[im 19/55  brain]
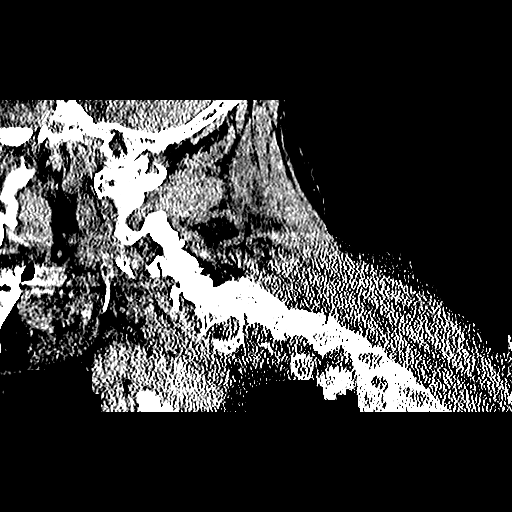
[im 28/55  brain]
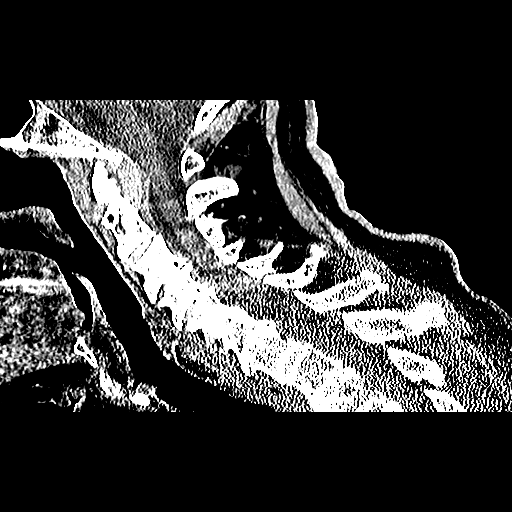
[im 37/55  brain]
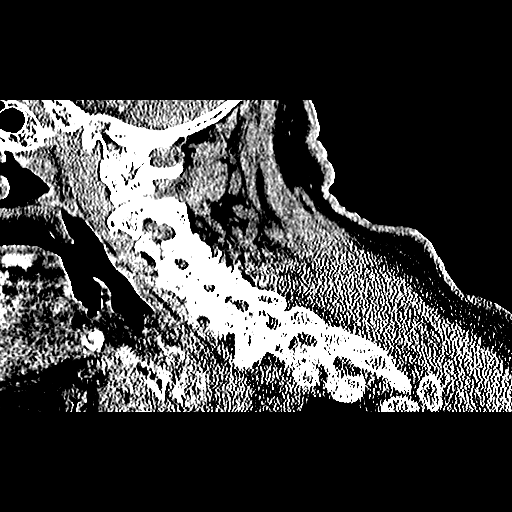

[Series 603: <mpr thick range(1)> · coronal · 0.41mm/px · 3 of 58 slices shown]
[im 20/58  brain]
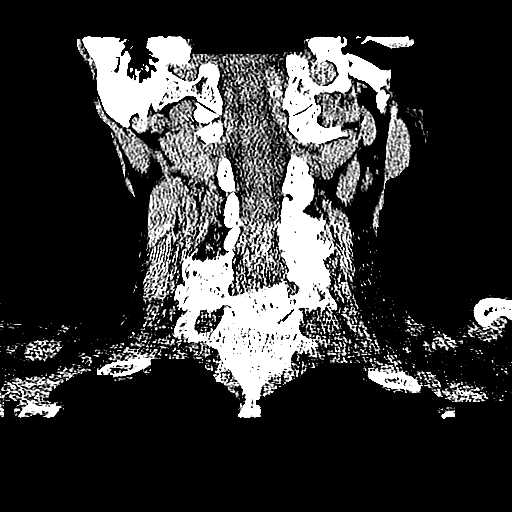
[im 26/58  brain]
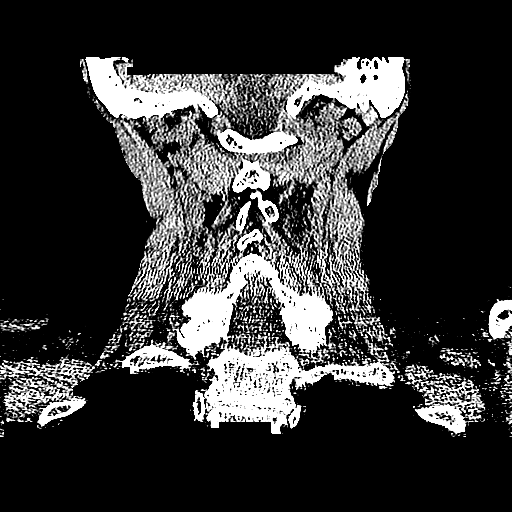
[im 32/58  brain]
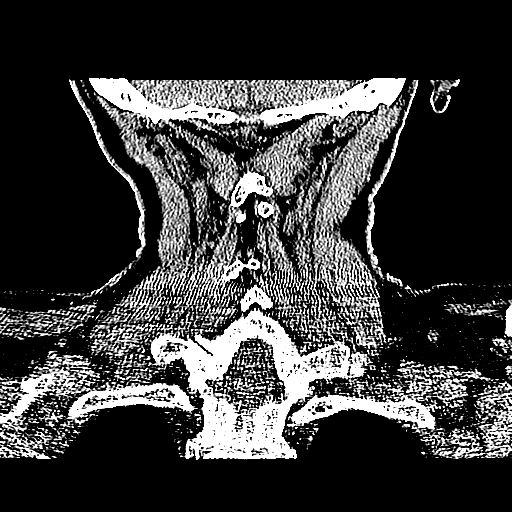

[Series 604: <mpr thick range(2)> · axial · 0.41mm/px · z∈[+1069,+1221]mm · 10 of 101 slices shown, 13 images]
[im 10/101  brain]
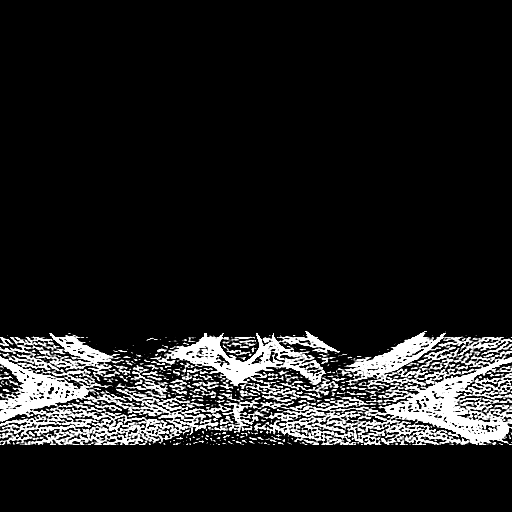
[im 10/101  bone]
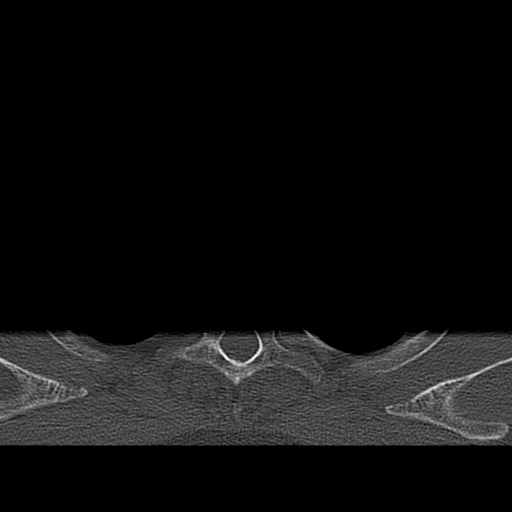
[im 19/101  brain]
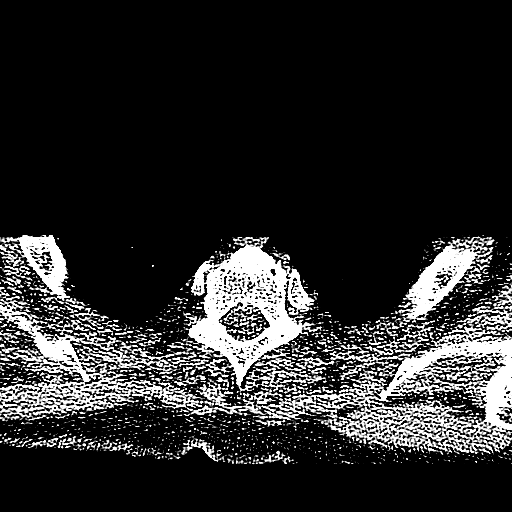
[im 28/101  brain]
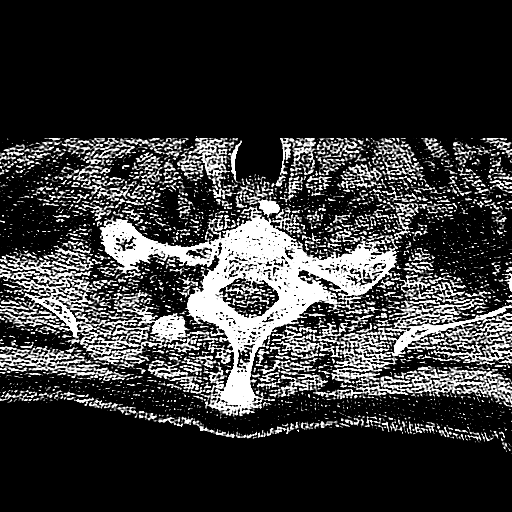
[im 37/101  brain]
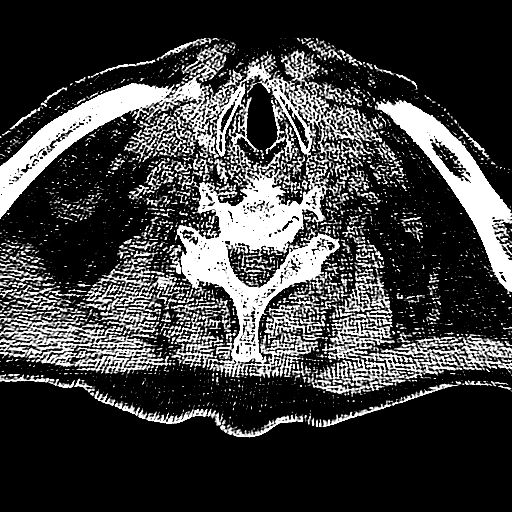
[im 46/101  brain]
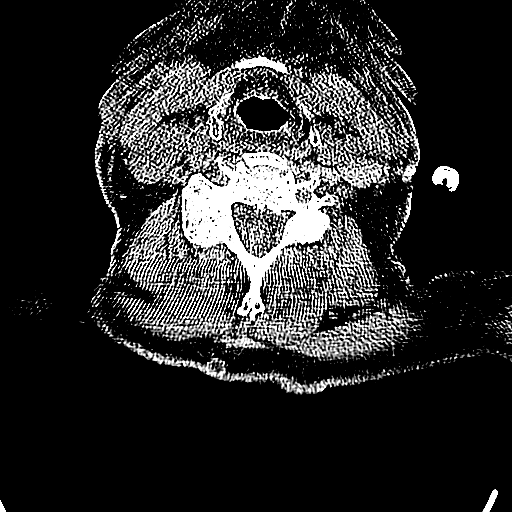
[im 46/101  bone]
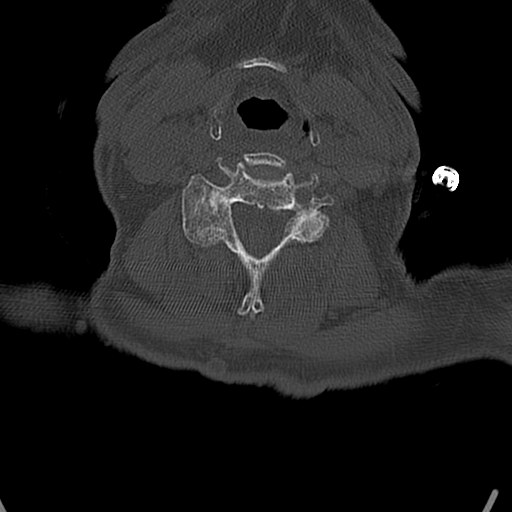
[im 55/101  brain]
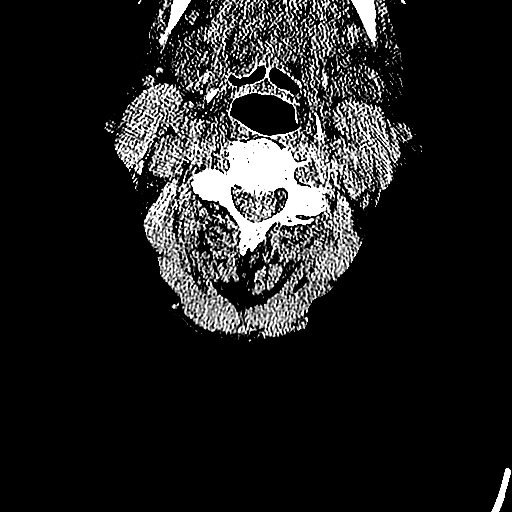
[im 64/101  brain]
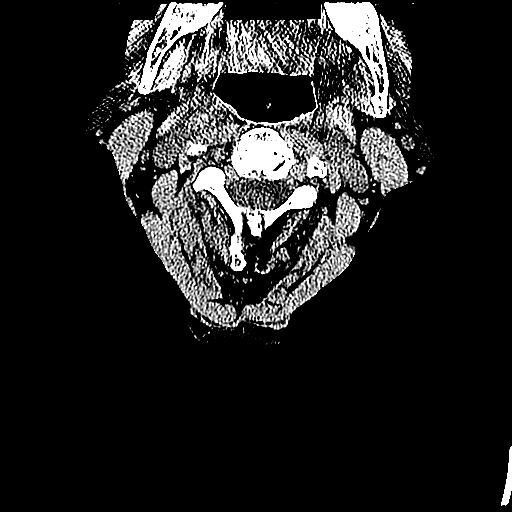
[im 73/101  brain]
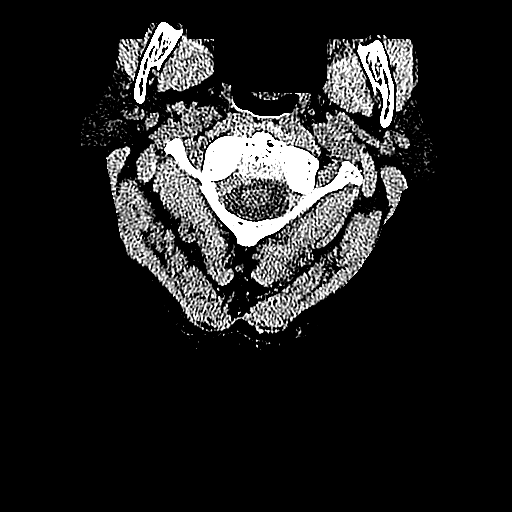
[im 82/101  brain]
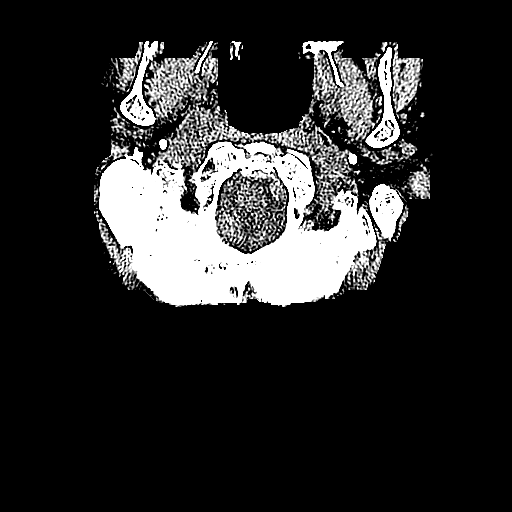
[im 82/101  bone]
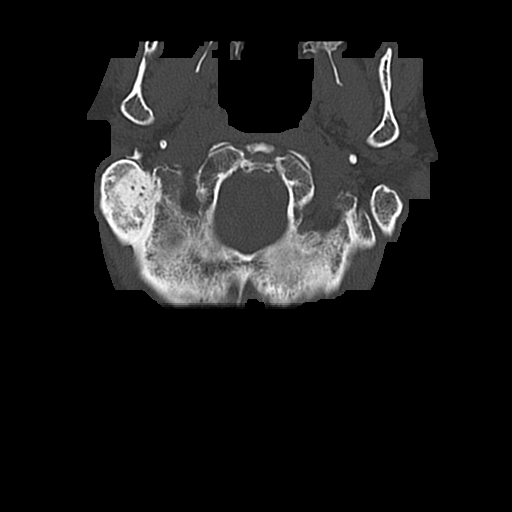
[im 91/101  brain]
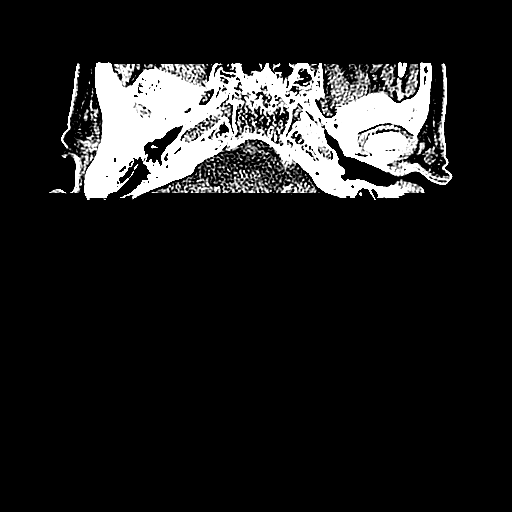

[16 of 47 positions shown; findings below may reference images not displayed]

FINDINGS: CT HEAD FINDINGS

No intra-axial or extra-axial pathologic fluid or blood collection.
No mass. Diffuse cerebral atrophy. Mild ventriculomegaly consistent
with degree of atrophy noted. These findings are stable. No acute
bony abnormality identified.

CT CERVICAL SPINE FINDINGS

Diffuse severe degenerative change present. Multilevel degenerative
changes. No evidence of fracture or dislocation. Pulmonary apices
are clear.
IMPRESSION: 1. No acute intracranial abnormality.
2. Diffuse cervical spine degenerative change.

## 2015-07-21 ENCOUNTER — Ambulatory Visit (INDEPENDENT_AMBULATORY_CARE_PROVIDER_SITE_OTHER): Payer: Medicare Other | Admitting: Neurology

## 2015-07-21 ENCOUNTER — Ambulatory Visit (INDEPENDENT_AMBULATORY_CARE_PROVIDER_SITE_OTHER): Payer: 59 | Admitting: Psychology

## 2015-07-21 ENCOUNTER — Encounter: Payer: Self-pay | Admitting: Neurology

## 2015-07-21 VITALS — BP 110/60 | HR 70 | Ht 68.0 in | Wt 185.5 lb

## 2015-07-21 DIAGNOSIS — G309 Alzheimer's disease, unspecified: Secondary | ICD-10-CM | POA: Diagnosis not present

## 2015-07-21 DIAGNOSIS — F339 Major depressive disorder, recurrent, unspecified: Secondary | ICD-10-CM | POA: Diagnosis not present

## 2015-07-21 DIAGNOSIS — F0280 Dementia in other diseases classified elsewhere without behavioral disturbance: Secondary | ICD-10-CM | POA: Diagnosis not present

## 2015-07-21 NOTE — Patient Instructions (Addendum)
1.  I recommend increasing Aricept to 10mg  daily 2.  Set routine schedule with same times for meals, going to bed, and waking up.  If 7am is too early, ask to work something out that they can give you a little more time.  However, you should eat lunch and dinner in the dining room with other people. 3.  Participate in exercise everyday. 4.  Follow up in 6 months. 5.  Call with questions or concerns.

## 2015-07-21 NOTE — Progress Notes (Signed)
Note routed

## 2015-07-21 NOTE — Progress Notes (Signed)
NEUROLOGY CONSULTATION NOTE  Daniel Ali MRN: [id number] DOB: 04/28/1925  Referring provider: Dr. Horald Ali  Primary care provider: Dr. Lajean Ali  Reason for consult:  Dementia  HISTORY OF PRESENT ILLNESS: Daniel Ali is an 81 year old right-handed male with COPD, myeloproliferative disease, Bipolar disorder, depression and history of ETOH abuse who presents for evaluation of dementia.  History obtained by son and PCP note.  Images of head CT and labs from October 2015 and brain MRI from April 2016 reviewed.  For at least 1.5 years, he has had a gradual decline in memory.  He lived by himself until October 2015 after experiencing falls, dehydration and aspiration pneumonia.  He reports short-term memory problems, such as forgetting some conversations or not being able to remember what he did that day.  Now, he has problems with some names, but is usually able to recognize people.  Over the past year, he has had multiple changes in environment which would exacerbate confusion.  From the hospital last year, he went to a nursing home.  He then went to an assisted living facility and then moved to another assisted living facility about 2 months ago.  He currently lives on the Memory Care Unit at [facility].   He has some executive dysfunction with trouble performing daily tasks.  He requires assistance with all ADLs, including bathing, dressing and taking medications.  He has required extensive PT/OT in regaining ability to ambulate due to executive dysfunction.  He is able to use a walker.  He partakes in exercise classes and uses the pool.  They wake him up at 7am but he is not a morning person, so it is often difficult for him to get up.  He goes to bed around 10:30 or 11 pm.  He usually sleeps well.  He takes sertraline and says depression is controlled.  However, he seems to lack motivation, such as taking part in activities or getting up in the morning.  He was started on  Aricept in July and currently takes 5mg  daily.  He does not exhibit hallucinations or paranoid delusions.  He is sometimes irritable but not agitated or combative.    Beside Aricept and sertraline, he takes Seroquel and Depakote ER (for mood).  CT of head from 08/07/14 and MRI of brain from 01/16/15 both show moderate diffuse cerebral atrophy with small vessel ischemic changes.  TSH was 1.080.  B12 was 363.  There is no known family history of dementia.  PAST MEDICAL HISTORY: Past Medical History  Diagnosis Date  . Loss of height   . Compression fracture   . Hypertension   . Hypercholesterolemia   . Rhinitis   . Dyspnea   . Weight loss   . CAD (coronary artery disease)   . Peptic ulcer disease   . Alcohol abuse   . AMI (acute mesenteric ischemia) ([place])   . Colon polyps   . Pleurisy with effusion   . Grief reaction   . Macular degeneration   . Leukocytosis, unspecified 05/07/2014  . Bilateral leg edema 05/07/2014  . Recurrent falls 05/07/2014  . COPD (chronic obstructive pulmonary disease) ([place])   . Dysphagia 03/12/2015    PAST SURGICAL HISTORY: Past Surgical History  Procedure Laterality Date  . Exploratory laparotomy  1978    due to kidney problems  . Thoracotomy  1982    hemothorax  . Back surgery  2003    Cervical Spondylosis  . Spine surgery  2003  C-spine  . Cardiac catheterization  06/21/03  . Coronary stent placement  07/2003    x3  . Coronary angioplasty with stent placement  05/2004  . Colonoscopy w/ polypectomy  11/2006    MEDICATIONS: Current Outpatient Prescriptions on File Prior to Visit  Medication Sig Dispense Refill  . acetaminophen (TYLENOL) 325 MG tablet Take 2 tablets (650 mg total) by mouth every 6 (six) hours as needed for mild pain (or Fever >/= 101).    Marland Kitchen acetaminophen-codeine (TYLENOL #3) 300-30 MG per tablet Take 1 tablet by mouth every 6 (six) hours as needed (for pain). 30 tablet 0  . aspirin 81 MG chewable tablet Chew 81 mg by mouth daily  after breakfast.    . budesonide-formoterol (SYMBICORT) 160-4.5 MCG/ACT inhaler Inhale 2 puffs into the lungs 2 (two) times daily.    . chlorhexidine (PERIDEX) 0.12 % solution Use as directed 15 mLs in the mouth or throat 2 (two) times daily.    . chlorthalidone (HYGROTON) 25 MG tablet Take 12.5 mg by mouth every other day.     . Cholecalciferol (VITAMIN D) 2000 UNITS CAPS Take 1 capsule by mouth daily with breakfast.    . divalproex (DEPAKOTE ER) 250 MG 24 hr tablet Take 250 mg by mouth 2 (two) times daily. Takes 250mg  in the morning at 0800 and 500mg  every evening at 2000    . donepezil (ARICEPT) 5 MG tablet Take 5 mg by mouth at bedtime.     . ferrous sulfate 325 (65 FE) MG tablet Take 325 mg by mouth daily with breakfast.    . furosemide (LASIX) 20 MG tablet Take 20 mg by mouth daily.     Marland Kitchen GLUCERNA (GLUCERNA) LIQD Take 237 mLs by mouth 2 (two) times daily between meals.    Marland Kitchen guaiFENesin (MUCINEX) 600 MG 12 hr tablet Take 1 tablet (600 mg total) by mouth 2 (two) times daily.    . hydroxyurea (DROXIA) 200 MG capsule Take 1 tablet M/W/F and take 2 tablets on other days 30 capsule 6  . LORazepam (ATIVAN) 0.5 MG tablet Take 0.5 tablets (0.25 mg total) by mouth every 12 (twelve) hours as needed for anxiety. 30 tablet 0  . metoprolol succinate (TOPROL-XL) 25 MG 24 hr tablet Take 1 tablet (25 mg total) by mouth daily. (Patient taking differently: Take 25 mg by mouth daily after breakfast. )    . Multiple Vitamins-Minerals (ICAPS LUTEIN & OMEGA-3 PO) Take by mouth daily.    . nitroGLYCERIN (NITROSTAT) 0.4 MG SL tablet Place 0.4 mg under the tongue every 5 (five) minutes as needed for chest pain.    . OXYGEN Inhale 2 L into the lungs at bedtime. Uses from 2000 to 0600    . pantoprazole (PROTONIX) 40 MG tablet Take 1 tablet (40 mg total) by mouth daily. 30 tablet 2  . polyethylene glycol powder (GLYCOLAX/MIRALAX) powder Take 17 g by mouth daily.     . QUEtiapine (SEROQUEL) 25 MG tablet Take 25 mg by  mouth every evening.    . sertraline (ZOLOFT) 50 MG tablet Take 75 mg by mouth daily with breakfast.     . sucralfate (CARAFATE) 1 G tablet Take 1 g by mouth 2 (two) times daily.     . tamsulosin (FLOMAX) 0.4 MG CAPS capsule Take 0.4 mg by mouth every evening.    . tiotropium (SPIRIVA) 18 MCG inhalation capsule Place 18 mcg into inhaler and inhale daily.    Marland Kitchen tolterodine (DETROL LA) 2 MG 24 hr  capsule Take 2 mg by mouth daily with breakfast.    . VENTOLIN HFA 108 (90 BASE) MCG/ACT inhaler USE 1 PUFFS BY MOUTH EVERY 4 HOURS AS NEEDED FOR WHEEZING  0   No current facility-administered medications on file prior to visit.    ALLERGIES: Allergies  Allergen Reactions  . Cefprozil Other (See Comments)    REACTION: insomnia  . Simvastatin Other (See Comments)    REACTION: MUSCLE ACHES    FAMILY HISTORY: Family History  Problem Relation Age of Onset  . Diabetes Father   . Colon cancer Mother   . Colon cancer Son     SOCIAL HISTORY: Social History   Social History  . Marital Status: Divorced    Spouse Name: N/A  . Number of Children: 4  . Years of Education: N/A   Occupational History  . Retired    Social History Main Topics  . Smoking status: Former Smoker    Quit date: 10/10/1946  . Smokeless tobacco: Never Used     Comment: cigars during college  . Alcohol Use: No     Comment: recovered ETOH abuser  . Drug Use: No  . Sexual Activity: Not Currently   Other Topics Concern  . Not on file   Social History Narrative   Lives in memory care but has his own room.  Retired Chief Financial Officer    REVIEW OF SYSTEMS: Constitutional: No fevers, chills, or sweats, no generalized fatigue, change in appetite Eyes: No visual changes, double vision, eye pain Ear, nose and throat: No hearing loss, ear pain, nasal congestion, sore throat Cardiovascular: No chest pain, palpitations Respiratory:  No shortness of breath at rest or with exertion, wheezes GastrointestinaI: No nausea, vomiting,  diarrhea, abdominal pain, fecal incontinence Genitourinary:  No dysuria, urinary retention or frequency Musculoskeletal:  No neck pain, back pain Integumentary: No rash, pruritus, skin lesions Neurological: as above Psychiatric: No depression, insomnia, anxiety Endocrine: No palpitations, fatigue, diaphoresis, mood swings, change in appetite, change in weight, increased thirst Hematologic/Lymphatic:  No anemia, purpura, petechiae. Allergic/Immunologic: no itchy/runny eyes, nasal congestion, recent allergic reactions, rashes  PHYSICAL EXAM: Filed Vitals:   07/21/15 1454  BP: 110/60  Pulse: 70   General: No acute distress.   Head:  Normocephalic/atraumatic Eyes:  fundi unremarkable, without vessel changes, exudates, hemorrhages or papilledema. Neck: supple, no paraspinal tenderness, full range of motion Back: No paraspinal tenderness Heart: regular rate and rhythm Lungs: Clear to auscultation bilaterally. Vascular: No carotid bruits. Neurological Exam: Mental status: alert and oriented to person, place, and time, recent and remote memory intact, fund of knowledge intact, attention and concentration intact, speech fluent and not dysarthric, language intact. Cranial nerves: CN I: not tested CN II: pupils equal, round and reactive to light, visual fields intact, fundi unremarkable, without vessel changes, exudates, hemorrhages or papilledema. CN III, IV, VI:  full range of motion, no nystagmus, no ptosis CN V: facial sensation intact CN VII: upper and lower face symmetric CN VIII: hearing intact CN IX, X: gag intact, uvula midline CN XI: sternocleidomastoid and trapezius muscles intact CN XII: tongue midline Bulk & Tone: normal, no fasciculations. Motor:  5/5 throughout  Sensation:  Temperature sensation reduced in feet and vibration sensation intact. Deep Tendon Reflexes:  2+ in upper extremities, absent in lower extremities, toes downgoing. Finger to nose testing:  Without  dysmetria.  Postural and kinetic tremor Gait:  Unable to easily take a step when standing.  IMPRESSION: Probable Alzheimer's disease  PLAN: 1.  I recommend increasing  Aricept to 10mg  daily 2.  Set routine schedule with same times for meals, going to bed, and waking up.  If 7am is too early, ask to work something out that they can give you a little more time.  However, you should eat lunch and dinner in the dining room with other people. 3.  Participate in exercise everyday. 4.  Follow up in 6 months. 5.  Call with questions or concerns.  60 minutes spent face to face with patient, over 50% spent discussing diagnosis and management.  Thank you for allowing me to take part in the care of this patient.  Metta Clines, DO  CC:  Daniel Manes, MD

## 2015-07-24 ENCOUNTER — Telehealth: Payer: Self-pay | Admitting: *Deleted

## 2015-07-24 NOTE — Telephone Encounter (Signed)
-----   Message from Heath Lark, MD sent at 07/24/2015  1:33 PM EDT ----- Regarding: cbc WBC 5.3 Hg 10.7 Platelets 396  Current treatment dose: Hydrea 200 mg daily Mon, Wed, Friday and 2 tablets daily on Tues, Thurs, Sat and Sunday.   Instructions: Blood counts are satisfactory Continue same dose Recheck in 4 weeks (not 2 weeks)

## 2015-07-24 NOTE — Telephone Encounter (Signed)
S/w nurse Solmon Ice and she confirmed pt on Hydrea dose below.  Instructed to continue same dose and recheck CBC in 4 weeks.   She says they need Rx for CBC faxed to them at fax [phone].  Will fax Rx on Monday 10/17

## 2015-08-04 ENCOUNTER — Ambulatory Visit (INDEPENDENT_AMBULATORY_CARE_PROVIDER_SITE_OTHER): Payer: 59 | Admitting: Psychology

## 2015-08-04 DIAGNOSIS — F339 Major depressive disorder, recurrent, unspecified: Secondary | ICD-10-CM

## 2015-08-05 ENCOUNTER — Ambulatory Visit (HOSPITAL_COMMUNITY): Payer: Self-pay | Admitting: Psychiatry

## 2015-08-18 ENCOUNTER — Ambulatory Visit (INDEPENDENT_AMBULATORY_CARE_PROVIDER_SITE_OTHER): Payer: 59 | Admitting: Psychology

## 2015-08-18 DIAGNOSIS — F339 Major depressive disorder, recurrent, unspecified: Secondary | ICD-10-CM | POA: Diagnosis not present

## 2015-08-25 ENCOUNTER — Telehealth: Payer: Self-pay | Admitting: *Deleted

## 2015-08-25 NOTE — Telephone Encounter (Signed)
-----   Message from Heath Lark, MD sent at 08/25/2015  8:37 AM EST ----- Regarding: CBC test Regarding: cbc dated 08/19/15 WBC 5.0 Hg 10.8 Platelets 331  Current treatment dose: Hydrea 200 mg daily Mon, Wed, Friday and 2 tablets daily on Tues, Thurs, Sat and Sunday.   Instructions: Blood counts are satisfactory Continue same dose Recheck in 8 weeks (not 4 weeks)

## 2015-08-25 NOTE — Telephone Encounter (Signed)
Printed out note below, December appt calendar and attached new Rx for CBC in 8 weeks,  Faxed to [facility] at fax [phone].   Also called Nursing unit at #[phone] and notified Charm Barges of note and new orders faxed, continue same dose hydrea.  She verbalized understanding and get fax off machine to pt's nurse.

## 2015-09-01 ENCOUNTER — Ambulatory Visit (INDEPENDENT_AMBULATORY_CARE_PROVIDER_SITE_OTHER): Payer: 59 | Admitting: Psychology

## 2015-09-01 DIAGNOSIS — F339 Major depressive disorder, recurrent, unspecified: Secondary | ICD-10-CM

## 2015-09-14 ENCOUNTER — Other Ambulatory Visit: Payer: Self-pay | Admitting: Hematology and Oncology

## 2015-09-14 ENCOUNTER — Telehealth: Payer: Self-pay | Admitting: *Deleted

## 2015-09-14 NOTE — Telephone Encounter (Signed)
-----   Message from Heath Lark, MD sent at 09/14/2015  9:23 AM EST ----- Regarding: OK to reschedule appt Pls call her son & [facility] He is very stable on Rx Recommend reschedule to March but continue CBC monitoring

## 2015-09-14 NOTE — Telephone Encounter (Signed)
LVM for pt and his son informing of message below.  Asked for return call to confirm they are ok w/ cancelling tomorrow and return in March. I also called [facility] and s/w [facility] [employer],  Christina.  Informed her of message and she will try to contact pt's son as well.   [facility] has orders to check pt's CBC again next month in January.  Asked her to have someone call nurse back to confirm ok to cancel tomorrow.  She agreed.

## 2015-09-14 NOTE — Telephone Encounter (Signed)
Pt's son, Richardson Landry,  Returned call.  He is in agreement to cancel appt tomorrow and have his father come in March.  He reports his father is doing well.  He understands to call us if any new problems or concerns prior to March.

## 2015-09-15 ENCOUNTER — Ambulatory Visit (INDEPENDENT_AMBULATORY_CARE_PROVIDER_SITE_OTHER): Payer: 59 | Admitting: Psychology

## 2015-09-15 ENCOUNTER — Ambulatory Visit: Payer: Self-pay | Admitting: Hematology and Oncology

## 2015-09-15 ENCOUNTER — Other Ambulatory Visit: Payer: Self-pay

## 2015-09-15 DIAGNOSIS — F339 Major depressive disorder, recurrent, unspecified: Secondary | ICD-10-CM | POA: Diagnosis not present

## 2015-09-29 ENCOUNTER — Ambulatory Visit (INDEPENDENT_AMBULATORY_CARE_PROVIDER_SITE_OTHER): Payer: 59 | Admitting: Psychology

## 2015-09-29 DIAGNOSIS — F339 Major depressive disorder, recurrent, unspecified: Secondary | ICD-10-CM | POA: Diagnosis not present

## 2015-10-07 ENCOUNTER — Ambulatory Visit: Payer: Self-pay | Admitting: Neurology

## 2015-10-13 ENCOUNTER — Ambulatory Visit (INDEPENDENT_AMBULATORY_CARE_PROVIDER_SITE_OTHER): Payer: Medicare Other | Admitting: Neurology

## 2015-10-13 ENCOUNTER — Encounter: Payer: Self-pay | Admitting: Neurology

## 2015-10-13 VITALS — BP 106/48 | HR 71 | Ht 68.0 in | Wt 185.0 lb

## 2015-10-13 DIAGNOSIS — F0280 Dementia in other diseases classified elsewhere without behavioral disturbance: Secondary | ICD-10-CM | POA: Diagnosis not present

## 2015-10-13 DIAGNOSIS — G301 Alzheimer's disease with late onset: Secondary | ICD-10-CM

## 2015-10-13 NOTE — Progress Notes (Addendum)
NEUROLOGY FOLLOW UP OFFICE NOTE  DRAXLER SEVERSON [id number]  HISTORY OF PRESENT ILLNESS: Daniel Ali is an 82 year old right-handed male with COPD, myeloproliferative disease, Bipolar disorder, depression and history of ETOH abuse who follows up for probable Alzheimer's disease.  UPDATE: Last visit, Daniel Ali was started on Aricept and currently is taking 5mg  daily.  His son would like to make sure that they are doing everything as they should.  He has good days and bad days.  Some days, his memory is worse.  He does not have trouble recognizing people.  Sometimes he has trouble remembering how to walk.  He participates in exercises but there are days in which he is not interested.  Appetite fluctuates.  Bad days tend to be brief, however.  He sleeps well.  He feels a little depressed.  His mood is managed by a geriatric psychiatrist.  HISTORY: For at least 2 years, he has had a gradual decline in memory.  He lived by himself until October 2015 after experiencing falls, dehydration and aspiration pneumonia.  He reports short-term memory problems, such as forgetting some conversations or not being able to remember what he did that day.  Now, he has problems with some names, but is usually able to recognize people.  Over the past year, he has had multiple changes in environment which would exacerbate confusion.  From the hospital last year, he went to a nursing home.  He then went to an assisted living facility and then moved to another assisted living facility about 2 months ago.  He currently lives on the Memory Care Unit at [facility].   He has some executive dysfunction with trouble performing daily tasks.  He requires assistance with all ADLs, including bathing, dressing and taking medications.  He has required extensive PT/OT in regaining ability to ambulate due to executive dysfunction.  He is able to use a walker.  He partakes in exercise classes and uses the pool.  They wake him up  at 7am but he is not a morning person, so it is often difficult for him to get up.  He goes to bed around 10:30 or 11 pm.  He usually sleeps well.  He takes sertraline and says depression is controlled.  However, he seems to lack motivation, such as taking part in activities or getting up in the morning.  He was started on Aricept in July and currently takes 5mg  daily.  He does not exhibit hallucinations or paranoid delusions.  He is sometimes irritable but not agitated or combative.    Beside Aricept and sertraline, he takes Seroquel and Depakote ER (for mood).  CT of head from 08/07/14 and MRI of brain from 01/16/15 both show moderate diffuse cerebral atrophy with small vessel ischemic changes.  TSH was 1.080.  B12 was 363.  There is no known family history of dementia.  PAST MEDICAL HISTORY: Past Medical History  Diagnosis Date  . Loss of height   . Compression fracture   . Hypertension   . Hypercholesterolemia   . Rhinitis   . Dyspnea   . Weight loss   . CAD (coronary artery disease)   . Peptic ulcer disease   . Alcohol abuse   . AMI (acute mesenteric ischemia) ([place])   . Colon polyps   . Pleurisy with effusion   . Grief reaction   . Macular degeneration   . Leukocytosis, unspecified 05/07/2014  . Bilateral leg edema 05/07/2014  . Recurrent falls 05/07/2014  .  COPD (chronic obstructive pulmonary disease) ([place])   . Dysphagia 03/12/2015    MEDICATIONS: Current Outpatient Prescriptions on File Prior to Visit  Medication Sig Dispense Refill  . acetaminophen (TYLENOL) 325 MG tablet Take 2 tablets (650 mg total) by mouth every 6 (six) hours as needed for mild pain (or Fever >/= 101).    Marland Kitchen acetaminophen-codeine (TYLENOL #3) 300-30 MG per tablet Take 1 tablet by mouth every 6 (six) hours as needed (for pain). 30 tablet 0  . aspirin 81 MG chewable tablet Chew 81 mg by mouth daily after breakfast.    . budesonide-formoterol (SYMBICORT) 160-4.5 MCG/ACT inhaler Inhale 2 puffs into the lungs 2  (two) times daily.    . chlorhexidine (PERIDEX) 0.12 % solution Use as directed 15 mLs in the mouth or throat 2 (two) times daily.    . chlorthalidone (HYGROTON) 25 MG tablet Take 12.5 mg by mouth every other day.     . Cholecalciferol (VITAMIN D) 2000 UNITS CAPS Take 1 capsule by mouth daily with breakfast.    . divalproex (DEPAKOTE ER) 250 MG 24 hr tablet Take 250 mg by mouth 2 (two) times daily. Takes 250mg  in the morning at 0800 and 500mg  every evening at 2000    . donepezil (ARICEPT) 5 MG tablet Take 5 mg by mouth at bedtime. Reported on 10/13/2015    . ferrous sulfate 325 (65 FE) MG tablet Take 325 mg by mouth daily with breakfast.    . furosemide (LASIX) 20 MG tablet Take 20 mg by mouth daily.     Marland Kitchen GLUCERNA (GLUCERNA) LIQD Take 237 mLs by mouth 2 (two) times daily between meals.    Marland Kitchen guaiFENesin (MUCINEX) 600 MG 12 hr tablet Take 1 tablet (600 mg total) by mouth 2 (two) times daily.    . hydroxyurea (DROXIA) 200 MG capsule Take 1 tablet M/W/F and take 2 tablets on other days 30 capsule 6  . LORazepam (ATIVAN) 0.5 MG tablet Take 0.5 tablets (0.25 mg total) by mouth every 12 (twelve) hours as needed for anxiety. 30 tablet 0  . metoprolol succinate (TOPROL-XL) 25 MG 24 hr tablet Take 1 tablet (25 mg total) by mouth daily. (Patient taking differently: Take 25 mg by mouth daily after breakfast. )    . Multiple Vitamins-Minerals (ICAPS LUTEIN & OMEGA-3 PO) Take by mouth daily.    . nitroGLYCERIN (NITROSTAT) 0.4 MG SL tablet Place 0.4 mg under the tongue every 5 (five) minutes as needed for chest pain.    . OXYGEN Inhale 2 L into the lungs at bedtime. Uses from 2000 to 0600    . pantoprazole (PROTONIX) 40 MG tablet Take 1 tablet (40 mg total) by mouth daily. 30 tablet 2  . polyethylene glycol powder (GLYCOLAX/MIRALAX) powder Take 17 g by mouth daily.     . Potassium Chloride CR (MICRO-K) 8 MEQ CPCR capsule CR     . QUEtiapine (SEROQUEL) 25 MG tablet Take 25 mg by mouth every evening.    .  sertraline (ZOLOFT) 50 MG tablet Take 75 mg by mouth daily with breakfast.     . sucralfate (CARAFATE) 1 G tablet Take 1 g by mouth 2 (two) times daily.     . tamsulosin (FLOMAX) 0.4 MG CAPS capsule Take 0.4 mg by mouth every evening.    . tiotropium (SPIRIVA) 18 MCG inhalation capsule Place 18 mcg into inhaler and inhale daily.    Marland Kitchen tolterodine (DETROL LA) 2 MG 24 hr capsule Take 2 mg by mouth daily  with breakfast.    . VENTOLIN HFA 108 (90 BASE) MCG/ACT inhaler USE 1 PUFFS BY MOUTH EVERY 4 HOURS AS NEEDED FOR WHEEZING  0   No current facility-administered medications on file prior to visit.    ALLERGIES: Allergies  Allergen Reactions  . Cefprozil Other (See Comments)    REACTION: insomnia  . Simvastatin Other (See Comments)    REACTION: MUSCLE ACHES    FAMILY HISTORY: Family History  Problem Relation Age of Onset  . Diabetes Father   . Colon cancer Mother   . Colon cancer Son     SOCIAL HISTORY: Social History   Social History  . Marital Status: Divorced    Spouse Name: N/A  . Number of Children: 4  . Years of Education: N/A   Occupational History  . Retired    Social History Main Topics  . Smoking status: Former Smoker    Quit date: 10/10/1946  . Smokeless tobacco: Never Used     Comment: cigars during college  . Alcohol Use: No     Comment: recovered ETOH abuser  . Drug Use: No  . Sexual Activity: Not Currently   Other Topics Concern  . Not on file   Social History Narrative   Lives in memory care but has his own room.  Retired Chief Financial Officer    REVIEW OF SYSTEMS: Constitutional: No fevers, chills, or sweats, no generalized fatigue, change in appetite Eyes: No visual changes, double vision, eye pain Ear, nose and throat: No hearing loss, ear pain, nasal congestion, sore throat Cardiovascular: No chest pain, palpitations Respiratory:  No shortness of breath at rest or with exertion, wheezes GastrointestinaI: No nausea, vomiting, diarrhea, abdominal pain,  fecal incontinence Genitourinary:  No dysuria, urinary retention or frequency Musculoskeletal:  No neck pain, back pain Integumentary: No rash, pruritus, skin lesions Neurological: as above Psychiatric:  Mild depression.  No insomnia, anxiety Endocrine: No palpitations, fatigue, diaphoresis, mood swings, change in appetite, change in weight, increased thirst Hematologic/Lymphatic:  No anemia, purpura, petechiae. Allergic/Immunologic: no itchy/runny eyes, nasal congestion, recent allergic reactions, rashes  PHYSICAL EXAM: Filed Vitals:   10/13/15 1319  BP: 106/48  Pulse: 71   General: No acute distress.   Head:  Normocephalic/atraumatic Eyes:  Not visualized on inspection Neck: supple, no paraspinal tenderness, full range of motion Heart:  Regular rate and rhythm Lungs:  Clear to auscultation bilaterally Back: No paraspinal tenderness Neurological Exam: alert and oriented to person, place, and time. Attention span and concentration intact, recent memory impaired, remote memory intact, fund of knowledge intact.  Speech fluent and not dysarthric, language intact.  CN II-XII intact.  Bulk and tone normal, muscle strength 5/5 throughout.  Sensation to light touch intact.  Deep tendon reflexes 2+ throughout, toes downgoing.  Finger to nose reveals postural and kinetic tremor. Nonambulatory  IMPRESSION: Alzheimer's disease  PLAN: 1.  Increase Aricept to 10mg  at bedtime 2.  Provided son with Alzheimer's support groups to help guide him on what he can do for his father 3.  Encouraged exercise and social interaction 4.  Follow up in 6 months.  28 minutes spent face to face with patient, over 50% spent discussing management.  Metta Clines, DO  CC:  Lajean Manes, MD

## 2015-10-13 NOTE — Patient Instructions (Addendum)
1.  Increase Aricept to 10mg  daily 2.  Maintain routine daily schedule as far as meals, when to go to bed, when to get up in the morning 3.  Follow up in 6 months. 4. Organized daily activities!!

## 2015-10-13 NOTE — Progress Notes (Signed)
Chart forwarded.  

## 2015-11-10 ENCOUNTER — Ambulatory Visit (INDEPENDENT_AMBULATORY_CARE_PROVIDER_SITE_OTHER): Payer: 59 | Admitting: Psychology

## 2015-11-10 DIAGNOSIS — F339 Major depressive disorder, recurrent, unspecified: Secondary | ICD-10-CM

## 2015-11-16 ENCOUNTER — Encounter: Payer: Self-pay | Admitting: Neurology

## 2015-11-19 ENCOUNTER — Telehealth: Payer: Self-pay | Admitting: *Deleted

## 2015-11-19 NOTE — Telephone Encounter (Signed)
Left message on son's cell phone. Labs reviewed by Dr Alvy Bimler- everything is stable, continue labs checks as scheduled

## 2015-11-24 ENCOUNTER — Ambulatory Visit (INDEPENDENT_AMBULATORY_CARE_PROVIDER_SITE_OTHER): Payer: 59 | Admitting: Psychology

## 2015-11-24 DIAGNOSIS — F339 Major depressive disorder, recurrent, unspecified: Secondary | ICD-10-CM | POA: Diagnosis not present

## 2015-12-22 ENCOUNTER — Telehealth: Payer: Self-pay | Admitting: *Deleted

## 2015-12-22 NOTE — Telephone Encounter (Signed)
Nurse, Ree Edman, from [phone])  called to report that their pharmacy says they "don't make" the Hydrea anymore and what does Dr. Alvy Bimler want pt to take?  I called her back to ask her to clarify w/ Pharmacy if it is just the 200 mg they are not making anymore?   Please ask pharmacy what doses are available?  Pt's current Dose is 200 mg,  Taking one tablet on M, W, F and two tablets the rest of the week.   Nurse says she will clarify w/ their pharmacy and call us back.

## 2015-12-25 ENCOUNTER — Telehealth: Payer: Self-pay | Admitting: *Deleted

## 2015-12-25 NOTE — Telephone Encounter (Signed)
Called [facility] [phone]) to follow up on a call from nurse earlier this week stating pharmacy can't get Hydroxyurea any more.  She was going to check what dose is available and she hasn't called back.   I s/w a tech who said she will leave a message for nurse to call us back on Monday.  She checked the [facility] and Med cart and says pt is still has the Hydroxyurea 200 mg in Med Cart and according to [facility] still getting that dose as ordered.  She will ask nurse to call back if she needs order for new dose.

## 2015-12-28 ENCOUNTER — Telehealth: Payer: Self-pay | Admitting: *Deleted

## 2015-12-28 NOTE — Telephone Encounter (Signed)
Pls see phone message last week from [place] I do not see what the problem is

## 2015-12-28 NOTE — Telephone Encounter (Signed)
Nurse called from [employer]. They did receive the Hydrea 200 mg tablets as ordered

## 2016-01-07 ENCOUNTER — Telehealth: Payer: Self-pay | Admitting: Hematology and Oncology

## 2016-01-07 ENCOUNTER — Ambulatory Visit (HOSPITAL_BASED_OUTPATIENT_CLINIC_OR_DEPARTMENT_OTHER): Payer: Medicare Other | Admitting: Hematology and Oncology

## 2016-01-07 ENCOUNTER — Other Ambulatory Visit (HOSPITAL_BASED_OUTPATIENT_CLINIC_OR_DEPARTMENT_OTHER): Payer: Medicare Other

## 2016-01-07 ENCOUNTER — Encounter: Payer: Self-pay | Admitting: Hematology and Oncology

## 2016-01-07 VITALS — BP 120/48 | HR 66 | Temp 98.1°F | Resp 18 | Ht 68.0 in | Wt 185.4 lb

## 2016-01-07 DIAGNOSIS — C946 Myelodysplastic disease, not classified: Secondary | ICD-10-CM

## 2016-01-07 DIAGNOSIS — D630 Anemia in neoplastic disease: Secondary | ICD-10-CM

## 2016-01-07 DIAGNOSIS — D471 Chronic myeloproliferative disease: Secondary | ICD-10-CM

## 2016-01-07 DIAGNOSIS — R5382 Chronic fatigue, unspecified: Secondary | ICD-10-CM | POA: Diagnosis not present

## 2016-01-07 LAB — CBC & DIFF AND RETIC
BASO%: 0.5 % (ref 0.0–2.0)
BASOS ABS: 0 10*3/uL (ref 0.0–0.1)
EOS ABS: 0.1 10*3/uL (ref 0.0–0.5)
EOS%: 1.5 % (ref 0.0–7.0)
HCT: 32.5 % — ABNORMAL LOW (ref 38.4–49.9)
HGB: 10.5 g/dL — ABNORMAL LOW (ref 13.0–17.1)
IMMATURE RETIC FRACT: 10.5 % (ref 3.00–10.60)
LYMPH%: 23.6 % (ref 14.0–49.0)
MCH: 32.9 pg (ref 27.2–33.4)
MCHC: 32.3 g/dL (ref 32.0–36.0)
MCV: 101.9 fL — ABNORMAL HIGH (ref 79.3–98.0)
MONO#: 0.6 10*3/uL (ref 0.1–0.9)
MONO%: 9.9 % (ref 0.0–14.0)
NEUT#: 4 10*3/uL (ref 1.5–6.5)
NEUT%: 64.5 % (ref 39.0–75.0)
NRBC: 0 % (ref 0–0)
Platelets: 457 10*3/uL — ABNORMAL HIGH (ref 140–400)
RBC: 3.19 10*6/uL — AB (ref 4.20–5.82)
RDW: 20.7 % — AB (ref 11.0–14.6)
Retic %: 1.85 % — ABNORMAL HIGH (ref 0.80–1.80)
Retic Ct Abs: 59.02 10*3/uL (ref 34.80–93.90)
WBC: 6.1 10*3/uL (ref 4.0–10.3)
lymph#: 1.5 10*3/uL (ref 0.9–3.3)

## 2016-01-07 LAB — TECHNOLOGIST REVIEW

## 2016-01-07 NOTE — Assessment & Plan Note (Signed)
His blood count is very stable for the last 6 months. I recommend we continue on hydroxyurea 200 mg daily I has discontinue regular blood work monitoring. I will just see him back in 6 months to repeat CBC. I discussed with the patient's son regarding other factors that may affect his platelet count such as bone marrow suppression, infection and severe illness In the event that the patient is hospitalized again, I have asked his son to notify me as soon as possible.

## 2016-01-07 NOTE — Assessment & Plan Note (Signed)
This is due to anemia of chronic illness and hydroxyurea.  Continue close monitoring

## 2016-01-07 NOTE — Progress Notes (Signed)
[place] OFFICE PROGRESS NOTE  Patient Care Team: Lajean Manes, MD as PCP - General (Internal Medicine) Lajean Manes, MD as Consulting Physician (Internal Medicine)  SUMMARY OF ONCOLOGIC HISTORY:  He was found to have abnormal CBC from recent CBC monitoring when he saw his primary care provider for recurrent falls. This patient had history disease, with angioplasty and stent placement in 2002 and 2005.  In May of this year, he fell at [employer]. After a detailed history, he stated that he had misjudged the distance between his hands and railing and fell. He did not sustain major injury but his CBC showed mild leukocytosis, anemia and very high platelet count, platelet count over 800,000. According to his son, the patient may have transient ischemic attack with mild weakness which subsequently resolved. A week ago, he had accident and had broken ribs. He had CT imaging study which confirmed retractors. Repeat CBC showed platelet count over 1 million. He is hence referred here. 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 The patient was started on hydroxyurea. On 05/16/2014, hydroxyurea is increased to 2 tablets a day On 06/09/2014, hydroxyurea dose is reduced back to one tablet per day.  on 09/09/2014, hydroxyurea is reduced to every other day. On 10/09/2014, hydroxyurea was placed on hold due to progressive anemia In February 2016, hydroxyurea is reduced to 500 mg on Mondays and Fridays only and hold the weekends In May 2016, he recurrent admission to the hospital with aspiration pneumonia In June 2016, hydroxyurea was placed on hold On 06/16/2015, hydroxyurea is started at 200 mg daily  INTERVAL HISTORY: Please see below for problem oriented charting. The patient is here today accompanied by his son. There were no reported episodes of dysphagia or choking or recurrent pneumonia. He was started on multiple medications such as Abilify and Depakote  recently. His son noticed some progressive decline in his stamina and the patient is noted to be fatigued. The patient denies any recent signs or symptoms of bleeding such as spontaneous epistaxis, hematuria or hematochezia. He has gained some weight recently  REVIEW OF SYSTEMS:   Constitutional: Denies fevers, chills or abnormal weight loss Eyes: Denies blurriness of vision Ears, nose, mouth, throat, and face: Denies mucositis or sore throat Respiratory: Denies cough, dyspnea or wheezes Cardiovascular: Denies palpitation, chest discomfort or lower extremity swelling Gastrointestinal:  Denies nausea, heartburn or change in bowel habits Skin: Denies abnormal skin rashes Lymphatics: Denies new lymphadenopathy or easy bruising Neurological:Denies numbness, tingling or new weaknesses Behavioral/Psych: Mood is stable, no new changes  All other systems were reviewed with the patient and are negative.  I have reviewed the past medical history, past surgical history, social history and family history with the patient and they are unchanged from previous note.  ALLERGIES:  is allergic to cefprozil and simvastatin.  MEDICATIONS:  Current Outpatient Prescriptions  Medication Sig Dispense Refill  . ARIPiprazole (ABILIFY) 5 MG tablet Take 5 mg by mouth daily.    Marland Kitchen aspirin 81 MG chewable tablet Chew 81 mg by mouth daily after breakfast.    . budesonide-formoterol (SYMBICORT) 160-4.5 MCG/ACT inhaler Inhale 2 puffs into the lungs 2 (two) times daily.    . chlorhexidine (PERIDEX) 0.12 % solution Use as directed 15 mLs in the mouth or throat 2 (two) times daily.    . Cholecalciferol (VITAMIN D) 2000 UNITS CAPS Take 1 capsule by mouth daily with breakfast.    . divalproex (DEPAKOTE ER) 250 MG 24 hr tablet Take 250 mg by  mouth 2 (two) times daily. Takes 250mg  in the morning at 0800 and 500mg  every evening at 2000    . donepezil (ARICEPT) 10 MG tablet     . furosemide (LASIX) 20 MG tablet Take 20 mg by  mouth daily.     Marland Kitchen GLUCERNA (GLUCERNA) LIQD Take 237 mLs by mouth 2 (two) times daily between meals.    Marland Kitchen guaiFENesin (MUCINEX) 600 MG 12 hr tablet Take 1 tablet (600 mg total) by mouth 2 (two) times daily.    . hydroxyurea (DROXIA) 200 MG capsule Take 1 tablet M/W/F and take 2 tablets on other days 30 capsule 6  . LORazepam (ATIVAN) 0.5 MG tablet Take 0.5 tablets (0.25 mg total) by mouth every 12 (twelve) hours as needed for anxiety. 30 tablet 0  . Multiple Vitamins-Minerals (ICAPS LUTEIN & OMEGA-3 PO) Take by mouth daily.    . OXYGEN Inhale 2 L into the lungs at bedtime. Uses from 2000 to 0600    . pantoprazole (PROTONIX) 40 MG tablet Take 1 tablet (40 mg total) by mouth daily. 30 tablet 2  . polyethylene glycol powder (GLYCOLAX/MIRALAX) powder Take 17 g by mouth daily.     . Potassium Chloride CR (MICRO-K) 8 MEQ CPCR capsule CR     . sertraline (ZOLOFT) 100 MG tablet     . tamsulosin (FLOMAX) 0.4 MG CAPS capsule Take 0.4 mg by mouth every evening.    . tiotropium (SPIRIVA) 18 MCG inhalation capsule Place 18 mcg into inhaler and inhale daily.    . VENTOLIN HFA 108 (90 BASE) MCG/ACT inhaler USE 1 PUFFS BY MOUTH EVERY 4 HOURS AS NEEDED FOR WHEEZING  0  . acetaminophen (TYLENOL) 325 MG tablet Take 2 tablets (650 mg total) by mouth every 6 (six) hours as needed for mild pain (or Fever >/= 101). (Patient not taking: Reported on 01/07/2016)    . nitroGLYCERIN (NITROSTAT) 0.4 MG SL tablet Place 0.4 mg under the tongue every 5 (five) minutes as needed for chest pain. Reported on 01/07/2016     No current facility-administered medications for this visit.    PHYSICAL EXAMINATION: ECOG PERFORMANCE STATUS: 2 - Symptomatic, <50% confined to bed  Filed Vitals:   01/07/16 1501  BP: 120/48  Pulse: 66  Temp: 98.1 F (36.7 C)  Resp: 18   Filed Weights   01/07/16 1501  Weight: 185 lb 6.4 oz (84.097 kg)    GENERAL:alert, no distress and comfortable. He looks elderly SKIN: skin color, texture, turgor  are normal, no rashes or significant lesions EYES: normal, Conjunctiva are pale and non-injected, sclera clear HEART: Noted mild bilateral lower extremity edema NEURO: alert & oriented x 3 with fluent speech, no focal motor/sensory deficits  LABORATORY DATA:  I have reviewed the data as listed    Component Value Date/Time   NA 138 02/28/2015 0518   NA 143 05/07/2014 1445   K 4.2 02/28/2015 0518   K 4.4 05/07/2014 1445   CL 101 02/28/2015 0518   CO2 28 02/28/2015 0518   CO2 28 05/07/2014 1445   GLUCOSE 166* 02/28/2015 0518   GLUCOSE 102 05/07/2014 1445   BUN 52* 02/28/2015 0518   BUN 21.6 05/07/2014 1445   CREATININE 1.72* 02/28/2015 0518   CREATININE 1.2 05/07/2014 1445   CALCIUM 8.3* 02/28/2015 0518   CALCIUM 8.6 05/07/2014 1445   PROT 5.5* 02/26/2015 0524   PROT 5.7* 05/07/2014 1445   ALBUMIN 3.3* 02/26/2015 0524   ALBUMIN 3.3* 05/07/2014 1445   AST 32 02/26/2015  0524   AST 25 05/07/2014 1445   ALT 22 02/26/2015 0524   ALT 18 05/07/2014 1445   ALKPHOS 44 02/26/2015 0524   ALKPHOS 103 05/07/2014 1445   BILITOT 0.9 02/26/2015 0524   BILITOT 0.43 05/07/2014 1445   GFRNONAA 34* 02/28/2015 0518   GFRAA 39* 02/28/2015 0518    No results found for: SPEP, UPEP  Lab Results  Component Value Date   WBC 6.1 01/07/2016   NEUTROABS 4.0 01/07/2016   HGB 10.5* 01/07/2016   HCT 32.5* 01/07/2016   MCV 101.9* 01/07/2016   PLT 457* 01/07/2016      Chemistry      Component Value Date/Time   NA 138 02/28/2015 0518   NA 143 05/07/2014 1445   K 4.2 02/28/2015 0518   K 4.4 05/07/2014 1445   CL 101 02/28/2015 0518   CO2 28 02/28/2015 0518   CO2 28 05/07/2014 1445   BUN 52* 02/28/2015 0518   BUN 21.6 05/07/2014 1445   CREATININE 1.72* 02/28/2015 0518   CREATININE 1.2 05/07/2014 1445      Component Value Date/Time   CALCIUM 8.3* 02/28/2015 0518   CALCIUM 8.6 05/07/2014 1445   ALKPHOS 44 02/26/2015 0524   ALKPHOS 103 05/07/2014 1445   AST 32 02/26/2015 0524   AST 25  05/07/2014 1445   ALT 22 02/26/2015 0524   ALT 18 05/07/2014 1445   BILITOT 0.9 02/26/2015 0524   BILITOT 0.43 05/07/2014 1445      ASSESSMENT & PLAN:  Myeloproliferative disease His blood count is very stable for the last 6 months. I recommend we continue on hydroxyurea 200 mg daily I has discontinue regular blood work monitoring. I will just see him back in 6 months to repeat CBC. I discussed with the patient's son regarding other factors that may affect his platelet count such as bone marrow suppression, infection and severe illness In the event that the patient is hospitalized again, I have asked his son to notify me as soon as possible.   Anemia in neoplastic disease This is due to anemia of chronic illness and hydroxyurea.  Continue close monitoring      Chronic fatigue The cause of the chronic fatigue is multi-factorial. The patient is mildly anemic but I do not believe this is a major contributing factor of his fatigue. His son is wondering whether this could be related to his medication such as Abilify and Depakote. Certainly this is possible but I would defer to the prescribing physician to review the medications with the patient and his son. I do not recommend adjusting the dose of his hydroxyurea for now and I do not believe regular blood work monitoring is necessary.   No orders of the defined types were placed in this encounter.   All questions were answered. The patient knows to call the clinic with any problems, questions or concerns. No barriers to learning was detected. I spent 15 minutes counseling the patient face to face. The total time spent in the appointment was 20 minutes and more than 50% was on counseling and review of test results     [facility], [place], [place] 01/07/2016 3:48 PM

## 2016-01-07 NOTE — Telephone Encounter (Signed)
Gave and printed appt shced and avs for pt for Sept  °

## 2016-01-07 NOTE — Assessment & Plan Note (Signed)
The cause of the chronic fatigue is multi-factorial. The patient is mildly anemic but I do not believe this is a major contributing factor of his fatigue. His son is wondering whether this could be related to his medication such as Abilify and Depakote. Certainly this is possible but I would defer to the prescribing physician to review the medications with the patient and his son. I do not recommend adjusting the dose of his hydroxyurea for now and I do not believe regular blood work monitoring is necessary.

## 2016-01-20 ENCOUNTER — Ambulatory Visit: Payer: Self-pay | Admitting: Neurology

## 2016-02-10 ENCOUNTER — Ambulatory Visit: Payer: Self-pay | Admitting: Neurology

## 2016-03-05 ENCOUNTER — Encounter: Payer: Self-pay | Admitting: Hematology and Oncology

## 2016-03-22 ENCOUNTER — Telehealth: Payer: Self-pay | Admitting: *Deleted

## 2016-03-22 NOTE — Telephone Encounter (Signed)
VM from [employer], [facility] [place], at [employer].  She states pt having difficulty swallowing pills d/t decline in health and overall condition. They are having to crush most of his pills now.  But pharmacy says capsule cannot be crushed or opened and does not come in liquid.   She asks if ok w/ Dr. Alvy Bimler to stop Droxia?   I Left Vm for pt's son to return call for update on pt's condition and plan of care.

## 2016-03-23 ENCOUNTER — Telehealth: Payer: Self-pay | Admitting: *Deleted

## 2016-03-23 NOTE — Telephone Encounter (Signed)
Called Erhard, Med Tech, at [facility], and informed ok w/ Dr. Alvy Bimler to stop Droxia.  She requests order be faxed to them at fax [phone].  D/C order faxed.    Persina informs that pt's MD at [facility], [place] wrote order yesterday for Hospice referral.

## 2016-03-23 NOTE — Telephone Encounter (Signed)
LVM for pt's son on home and cell requesting he call nurse back regarding pt's condition.  [facility] reports pt's condition is declining and he is having difficulty swallowing pills, including Hydroxyurea.  They asked if ok to stop this pill.  It is ok w/ Dr. Alvy Bimler for pt to stop since he cannot swallow it and his condition is declining.   Would like to confirm w/ son he is aware of medication being stopped.

## 2016-03-23 NOTE — Telephone Encounter (Signed)
I am in agreement with overall decline the patient should be placed on palliative care and stop blood work monitoring, hydrea and future follow up here, if son agrees

## 2016-04-11 ENCOUNTER — Ambulatory Visit: Payer: Self-pay | Admitting: Neurology

## 2016-06-10 DEATH — deceased

## 2016-06-16 ENCOUNTER — Encounter: Payer: Self-pay | Admitting: Hematology and Oncology

## 2016-06-16 ENCOUNTER — Encounter: Payer: Self-pay | Admitting: Neurology

## 2016-07-07 ENCOUNTER — Ambulatory Visit: Payer: Self-pay | Admitting: Hematology and Oncology

## 2016-07-07 ENCOUNTER — Other Ambulatory Visit: Payer: Self-pay
